# Patient Record
Sex: Female | Born: 1950 | Race: White | Hispanic: No | Smoking: Former smoker
Health system: Southern US, Community
[De-identification: ages and names within clinical notes are randomized; demographics above are authoritative.]

## PROBLEM LIST (undated history)

## (undated) DIAGNOSIS — E785 Hyperlipidemia, unspecified: Secondary | ICD-10-CM

## (undated) DIAGNOSIS — J309 Allergic rhinitis, unspecified: Secondary | ICD-10-CM

## (undated) DIAGNOSIS — Z780 Asymptomatic menopausal state: Secondary | ICD-10-CM

## (undated) DIAGNOSIS — R011 Cardiac murmur, unspecified: Secondary | ICD-10-CM

## (undated) DIAGNOSIS — F419 Anxiety disorder, unspecified: Secondary | ICD-10-CM

## (undated) DIAGNOSIS — G4700 Insomnia, unspecified: Secondary | ICD-10-CM

## (undated) DIAGNOSIS — E8881 Metabolic syndrome: Secondary | ICD-10-CM

## (undated) DIAGNOSIS — M1990 Unspecified osteoarthritis, unspecified site: Secondary | ICD-10-CM

## (undated) DIAGNOSIS — T7840XA Allergy, unspecified, initial encounter: Secondary | ICD-10-CM

## (undated) DIAGNOSIS — E119 Type 2 diabetes mellitus without complications: Secondary | ICD-10-CM

## (undated) DIAGNOSIS — C4491 Basal cell carcinoma of skin, unspecified: Secondary | ICD-10-CM

## (undated) DIAGNOSIS — N200 Calculus of kidney: Secondary | ICD-10-CM

## (undated) HISTORY — DX: Allergic rhinitis, unspecified: J30.9

## (undated) HISTORY — PX: OTHER SURGICAL HISTORY: SHX169

## (undated) HISTORY — DX: Insomnia, unspecified: G47.00

## (undated) HISTORY — DX: Hyperlipidemia, unspecified: E78.5

## (undated) HISTORY — DX: Anxiety disorder, unspecified: F41.9

## (undated) HISTORY — DX: Metabolic syndrome: E88.81

## (undated) HISTORY — DX: Allergy, unspecified, initial encounter: T78.40XA

## (undated) HISTORY — DX: Unspecified osteoarthritis, unspecified site: M19.90

## (undated) HISTORY — DX: Asymptomatic menopausal state: Z78.0

## (undated) HISTORY — DX: Type 2 diabetes mellitus without complications: E11.9

## (undated) HISTORY — DX: Calculus of kidney: N20.0

## (undated) HISTORY — DX: Basal cell carcinoma of skin, unspecified: C44.91

## (undated) HISTORY — DX: Metabolic syndrome: E88.810

## (undated) HISTORY — DX: Cardiac murmur, unspecified: R01.1

## (undated) HISTORY — PX: CHOLECYSTECTOMY: SHX55

---

## 1998-02-22 ENCOUNTER — Other Ambulatory Visit: Admission: RE | Admit: 1998-02-22 | Discharge: 1998-02-22 | Payer: Self-pay | Admitting: Orthopedic Surgery

## 1998-11-06 ENCOUNTER — Other Ambulatory Visit: Admission: RE | Admit: 1998-11-06 | Discharge: 1998-11-06 | Payer: Self-pay | Admitting: Obstetrics & Gynecology

## 2000-01-20 ENCOUNTER — Other Ambulatory Visit: Admission: RE | Admit: 2000-01-20 | Discharge: 2000-01-20 | Payer: Self-pay | Admitting: Obstetrics & Gynecology

## 2001-05-05 ENCOUNTER — Other Ambulatory Visit: Admission: RE | Admit: 2001-05-05 | Discharge: 2001-05-05 | Payer: Self-pay | Admitting: Obstetrics & Gynecology

## 2001-05-27 ENCOUNTER — Encounter: Admission: RE | Admit: 2001-05-27 | Discharge: 2001-08-25 | Payer: Self-pay | Admitting: Family Medicine

## 2002-06-28 ENCOUNTER — Other Ambulatory Visit: Admission: RE | Admit: 2002-06-28 | Discharge: 2002-06-28 | Payer: Self-pay | Admitting: Obstetrics & Gynecology

## 2003-02-09 ENCOUNTER — Observation Stay (HOSPITAL_COMMUNITY): Admission: EM | Admit: 2003-02-09 | Discharge: 2003-02-10 | Payer: Self-pay | Admitting: Emergency Medicine

## 2003-02-09 ENCOUNTER — Encounter (INDEPENDENT_AMBULATORY_CARE_PROVIDER_SITE_OTHER): Payer: Self-pay | Admitting: Specialist

## 2003-02-09 ENCOUNTER — Encounter: Payer: Self-pay | Admitting: Emergency Medicine

## 2003-09-15 ENCOUNTER — Other Ambulatory Visit: Admission: RE | Admit: 2003-09-15 | Discharge: 2003-09-15 | Payer: Self-pay | Admitting: Obstetrics & Gynecology

## 2004-06-10 ENCOUNTER — Encounter: Admission: RE | Admit: 2004-06-10 | Discharge: 2004-07-11 | Payer: Self-pay | Admitting: Family Medicine

## 2004-07-12 ENCOUNTER — Ambulatory Visit: Payer: Self-pay | Admitting: Family Medicine

## 2004-08-21 ENCOUNTER — Ambulatory Visit: Payer: Self-pay | Admitting: Internal Medicine

## 2004-09-11 ENCOUNTER — Encounter: Admission: RE | Admit: 2004-09-11 | Discharge: 2004-12-10 | Payer: Self-pay | Admitting: Family Medicine

## 2004-09-25 ENCOUNTER — Ambulatory Visit: Payer: Self-pay | Admitting: Family Medicine

## 2004-10-14 ENCOUNTER — Other Ambulatory Visit: Admission: RE | Admit: 2004-10-14 | Discharge: 2004-10-14 | Payer: Self-pay | Admitting: Obstetrics & Gynecology

## 2004-10-16 ENCOUNTER — Ambulatory Visit: Payer: Self-pay | Admitting: Internal Medicine

## 2004-10-21 ENCOUNTER — Ambulatory Visit: Payer: Self-pay | Admitting: Internal Medicine

## 2005-02-27 ENCOUNTER — Ambulatory Visit: Payer: Self-pay | Admitting: Internal Medicine

## 2005-03-25 ENCOUNTER — Ambulatory Visit: Payer: Self-pay | Admitting: Internal Medicine

## 2005-05-29 ENCOUNTER — Ambulatory Visit: Payer: Self-pay | Admitting: Internal Medicine

## 2005-09-03 ENCOUNTER — Ambulatory Visit: Payer: Self-pay | Admitting: Internal Medicine

## 2005-12-03 ENCOUNTER — Ambulatory Visit: Payer: Self-pay | Admitting: Family Medicine

## 2006-01-12 ENCOUNTER — Ambulatory Visit: Payer: Self-pay | Admitting: Internal Medicine

## 2006-03-24 ENCOUNTER — Ambulatory Visit: Payer: Self-pay | Admitting: Internal Medicine

## 2006-05-07 ENCOUNTER — Ambulatory Visit: Payer: Self-pay | Admitting: Internal Medicine

## 2006-07-17 ENCOUNTER — Ambulatory Visit: Payer: Self-pay | Admitting: Internal Medicine

## 2006-07-17 LAB — CONVERTED CEMR LAB
ALT: 25 units/L (ref 0–40)
AST: 22 units/L (ref 0–37)
Chol/HDL Ratio, serum: 4.7
Cholesterol: 188 mg/dL (ref 0–200)
Glucose, Bld: 128 mg/dL — ABNORMAL HIGH (ref 70–99)
HDL: 40.1 mg/dL (ref >39.0)
LDL Cholesterol: 116 mg/dL — ABNORMAL HIGH (ref 0–99)
Triglyceride fasting, serum: 157 mg/dL — ABNORMAL HIGH (ref 0–149)
VLDL: 31 mg/dL (ref 0–40)

## 2006-09-23 ENCOUNTER — Ambulatory Visit: Payer: Self-pay | Admitting: Internal Medicine

## 2007-02-01 ENCOUNTER — Telehealth (INDEPENDENT_AMBULATORY_CARE_PROVIDER_SITE_OTHER): Payer: Self-pay | Admitting: *Deleted

## 2007-02-03 ENCOUNTER — Telehealth (INDEPENDENT_AMBULATORY_CARE_PROVIDER_SITE_OTHER): Payer: Self-pay | Admitting: *Deleted

## 2007-03-22 ENCOUNTER — Telehealth: Payer: Self-pay | Admitting: Internal Medicine

## 2007-04-15 ENCOUNTER — Telehealth: Payer: Self-pay | Admitting: Internal Medicine

## 2007-04-22 ENCOUNTER — Ambulatory Visit: Payer: Self-pay | Admitting: Internal Medicine

## 2007-04-22 DIAGNOSIS — J309 Allergic rhinitis, unspecified: Secondary | ICD-10-CM

## 2007-04-22 DIAGNOSIS — Z87442 Personal history of urinary calculi: Secondary | ICD-10-CM

## 2007-04-22 DIAGNOSIS — N951 Menopausal and female climacteric states: Secondary | ICD-10-CM | POA: Insufficient documentation

## 2007-04-22 DIAGNOSIS — G4700 Insomnia, unspecified: Secondary | ICD-10-CM | POA: Insufficient documentation

## 2007-04-22 DIAGNOSIS — E785 Hyperlipidemia, unspecified: Secondary | ICD-10-CM

## 2007-04-22 DIAGNOSIS — D1800 Hemangioma unspecified site: Secondary | ICD-10-CM | POA: Insufficient documentation

## 2007-04-22 DIAGNOSIS — C792 Secondary malignant neoplasm of skin: Secondary | ICD-10-CM

## 2007-04-22 HISTORY — DX: Hyperlipidemia, unspecified: E78.5

## 2007-04-22 HISTORY — DX: Allergic rhinitis, unspecified: J30.9

## 2007-07-13 ENCOUNTER — Telehealth: Payer: Self-pay | Admitting: Internal Medicine

## 2007-10-13 ENCOUNTER — Telehealth (INDEPENDENT_AMBULATORY_CARE_PROVIDER_SITE_OTHER): Payer: Self-pay | Admitting: *Deleted

## 2007-11-12 ENCOUNTER — Ambulatory Visit: Payer: Self-pay | Admitting: Internal Medicine

## 2008-04-03 ENCOUNTER — Ambulatory Visit: Payer: Self-pay | Admitting: Internal Medicine

## 2008-04-03 LAB — CONVERTED CEMR LAB
ALT: 24 units/L (ref 0–35)
AST: 22 units/L (ref 0–37)
BUN: 13 mg/dL (ref 6–23)
CO2: 27 meq/L (ref 19–32)
Calcium: 9.6 mg/dL (ref 8.4–10.5)
Chloride: 107 meq/L (ref 96–112)
Cholesterol: 218 mg/dL (ref 0–200)
Creatinine, Ser: 0.8 mg/dL (ref 0.4–1.2)
Direct LDL: 156.9 mg/dL
GFR calc Af Amer: 94 mL/min
GFR calc non Af Amer: 78 mL/min
Glucose, Bld: 209 mg/dL — ABNORMAL HIGH (ref 70–99)
HDL: 35.2 mg/dL — ABNORMAL LOW (ref >39.0)
Potassium: 4.8 meq/L (ref 3.5–5.1)
Sodium: 141 meq/L (ref 135–145)
TSH: 2.2 microintl units/mL (ref 0.35–5.50)
Total CHOL/HDL Ratio: 6.2
Triglycerides: 181 mg/dL — ABNORMAL HIGH (ref 0–149)
VLDL: 36 mg/dL (ref 0–40)

## 2008-04-10 ENCOUNTER — Telehealth (INDEPENDENT_AMBULATORY_CARE_PROVIDER_SITE_OTHER): Payer: Self-pay | Admitting: *Deleted

## 2008-04-11 ENCOUNTER — Ambulatory Visit: Payer: Self-pay | Admitting: Internal Medicine

## 2008-04-12 ENCOUNTER — Telehealth: Payer: Self-pay | Admitting: Internal Medicine

## 2008-08-09 ENCOUNTER — Telehealth (INDEPENDENT_AMBULATORY_CARE_PROVIDER_SITE_OTHER): Payer: Self-pay | Admitting: *Deleted

## 2008-08-14 ENCOUNTER — Ambulatory Visit: Payer: Self-pay | Admitting: Internal Medicine

## 2008-08-14 DIAGNOSIS — E119 Type 2 diabetes mellitus without complications: Secondary | ICD-10-CM

## 2008-08-14 DIAGNOSIS — E118 Type 2 diabetes mellitus with unspecified complications: Secondary | ICD-10-CM

## 2008-08-14 HISTORY — DX: Type 2 diabetes mellitus without complications: E11.9

## 2008-08-31 ENCOUNTER — Ambulatory Visit: Payer: Self-pay | Admitting: Family Medicine

## 2008-09-06 ENCOUNTER — Telehealth (INDEPENDENT_AMBULATORY_CARE_PROVIDER_SITE_OTHER): Payer: Self-pay | Admitting: *Deleted

## 2008-10-25 ENCOUNTER — Telehealth (INDEPENDENT_AMBULATORY_CARE_PROVIDER_SITE_OTHER): Payer: Self-pay | Admitting: *Deleted

## 2008-12-25 ENCOUNTER — Telehealth (INDEPENDENT_AMBULATORY_CARE_PROVIDER_SITE_OTHER): Payer: Self-pay | Admitting: *Deleted

## 2008-12-26 ENCOUNTER — Telehealth: Payer: Self-pay | Admitting: Internal Medicine

## 2008-12-28 ENCOUNTER — Ambulatory Visit: Payer: Self-pay | Admitting: Internal Medicine

## 2008-12-28 LAB — CONVERTED CEMR LAB
AST: 20 units/L (ref 0–37)
Chloride: 107 meq/L (ref 96–112)
Creatinine, Ser: 0.9 mg/dL (ref 0.4–1.2)
GFR calc non Af Amer: 67.87 mL/min (ref >60)
HDL: 39.2 mg/dL (ref >39.00)
Hgb A1c MFr Bld: 7 % — ABNORMAL HIGH (ref 4.6–6.5)
Potassium: 4.4 meq/L (ref 3.5–5.1)
Total CHOL/HDL Ratio: 6
VLDL: 31.8 mg/dL (ref 0.0–40.0)

## 2009-01-01 ENCOUNTER — Ambulatory Visit: Payer: Self-pay | Admitting: Internal Medicine

## 2009-01-01 DIAGNOSIS — F411 Generalized anxiety disorder: Secondary | ICD-10-CM

## 2009-01-05 ENCOUNTER — Telehealth: Payer: Self-pay | Admitting: Internal Medicine

## 2009-01-05 LAB — CONVERTED CEMR LAB: Vit D, 25-Hydroxy: 21 ng/mL — ABNORMAL LOW (ref 30–89)

## 2009-03-12 ENCOUNTER — Encounter: Payer: Self-pay | Admitting: Internal Medicine

## 2009-05-02 LAB — CONVERTED CEMR LAB: Pap Smear: NORMAL

## 2009-05-02 LAB — HM MAMMOGRAPHY: HM Mammogram: NORMAL

## 2009-07-10 ENCOUNTER — Telehealth (INDEPENDENT_AMBULATORY_CARE_PROVIDER_SITE_OTHER): Payer: Self-pay | Admitting: *Deleted

## 2009-07-10 ENCOUNTER — Encounter: Payer: Self-pay | Admitting: Family Medicine

## 2009-07-10 ENCOUNTER — Ambulatory Visit: Payer: Self-pay | Admitting: Family

## 2009-07-10 LAB — CONVERTED CEMR LAB
Bilirubin Urine: NEGATIVE
Protein, U semiquant: NEGATIVE
Specific Gravity, Urine: 1.015

## 2009-07-17 ENCOUNTER — Telehealth (INDEPENDENT_AMBULATORY_CARE_PROVIDER_SITE_OTHER): Payer: Self-pay | Admitting: *Deleted

## 2009-08-08 ENCOUNTER — Ambulatory Visit: Payer: Self-pay | Admitting: Internal Medicine

## 2009-08-08 DIAGNOSIS — E559 Vitamin D deficiency, unspecified: Secondary | ICD-10-CM | POA: Insufficient documentation

## 2009-08-08 LAB — CONVERTED CEMR LAB: Vit D, 25-Hydroxy: 26 ng/mL — ABNORMAL LOW (ref 30–89)

## 2009-08-13 LAB — CONVERTED CEMR LAB
Basophils Relative: 1.1 % (ref 0.0–3.0)
Eosinophils Relative: 4 % (ref 0.0–5.0)
Hgb A1c MFr Bld: 7.1 % — ABNORMAL HIGH (ref 4.6–6.5)
Lymphocytes Relative: 34.9 % (ref 12.0–46.0)
MCV: 98.3 fL (ref 78.0–100.0)
Microalb Creat Ratio: 5.4 mg/g (ref 0.0–30.0)
Microalb, Ur: 0.9 mg/dL (ref 0.0–1.9)
Monocytes Absolute: 0.4 10*3/uL (ref 0.1–1.0)
Monocytes Relative: 5.9 % (ref 3.0–12.0)
Neutrophils Relative %: 54.1 % (ref 43.0–77.0)
Platelets: 258 10*3/uL (ref 150.0–400.0)
RBC: 4.82 M/uL (ref 3.87–5.11)
WBC: 6.1 10*3/uL (ref 4.5–10.5)

## 2009-09-24 ENCOUNTER — Encounter (INDEPENDENT_AMBULATORY_CARE_PROVIDER_SITE_OTHER): Payer: Self-pay | Admitting: *Deleted

## 2009-09-24 ENCOUNTER — Telehealth: Payer: Self-pay | Admitting: Internal Medicine

## 2009-12-06 ENCOUNTER — Encounter (INDEPENDENT_AMBULATORY_CARE_PROVIDER_SITE_OTHER): Payer: Self-pay | Admitting: *Deleted

## 2009-12-06 ENCOUNTER — Ambulatory Visit: Payer: Self-pay | Admitting: Internal Medicine

## 2009-12-11 LAB — CONVERTED CEMR LAB
BUN: 13 mg/dL (ref 6–23)
Chloride: 105 meq/L (ref 96–112)
Cholesterol: 210 mg/dL — ABNORMAL HIGH (ref 0–200)
GFR calc non Af Amer: 77.5 mL/min (ref >60)
HDL: 48.3 mg/dL (ref >39.00)
Hgb A1c MFr Bld: 7.3 % — ABNORMAL HIGH (ref 4.6–6.5)
Potassium: 4.4 meq/L (ref 3.5–5.1)
Sodium: 141 meq/L (ref 135–145)
Total CHOL/HDL Ratio: 4
Triglycerides: 180 mg/dL — ABNORMAL HIGH (ref 0.0–149.0)
VLDL: 36 mg/dL (ref 0.0–40.0)

## 2009-12-31 ENCOUNTER — Ambulatory Visit: Payer: Self-pay | Admitting: Internal Medicine

## 2010-01-01 ENCOUNTER — Encounter (INDEPENDENT_AMBULATORY_CARE_PROVIDER_SITE_OTHER): Payer: Self-pay | Admitting: *Deleted

## 2010-02-19 ENCOUNTER — Telehealth: Payer: Self-pay | Admitting: Internal Medicine

## 2010-04-11 ENCOUNTER — Ambulatory Visit: Payer: Self-pay | Admitting: Internal Medicine

## 2010-04-12 LAB — CONVERTED CEMR LAB: Hgb A1c MFr Bld: 7.7 % — ABNORMAL HIGH (ref 4.6–6.5)

## 2010-04-16 ENCOUNTER — Telehealth: Payer: Self-pay | Admitting: Internal Medicine

## 2010-04-30 ENCOUNTER — Telehealth (INDEPENDENT_AMBULATORY_CARE_PROVIDER_SITE_OTHER): Payer: Self-pay | Admitting: *Deleted

## 2010-05-02 ENCOUNTER — Telehealth: Payer: Self-pay | Admitting: Internal Medicine

## 2010-05-14 ENCOUNTER — Telehealth: Payer: Self-pay | Admitting: Internal Medicine

## 2010-06-05 ENCOUNTER — Telehealth: Payer: Self-pay | Admitting: Internal Medicine

## 2010-08-13 ENCOUNTER — Telehealth: Payer: Self-pay | Admitting: Internal Medicine

## 2010-08-20 ENCOUNTER — Ambulatory Visit: Payer: Self-pay | Admitting: Internal Medicine

## 2010-09-06 ENCOUNTER — Other Ambulatory Visit: Payer: Self-pay | Admitting: Internal Medicine

## 2010-09-06 ENCOUNTER — Ambulatory Visit
Admission: RE | Admit: 2010-09-06 | Discharge: 2010-09-06 | Payer: Self-pay | Source: Home / Self Care | Attending: Internal Medicine | Admitting: Internal Medicine

## 2010-09-06 LAB — BASIC METABOLIC PANEL
BUN: 16 mg/dL (ref 6–23)
CO2: 27 mEq/L (ref 19–32)
Calcium: 9.6 mg/dL (ref 8.4–10.5)
Chloride: 104 mEq/L (ref 96–112)
Creatinine, Ser: 0.8 mg/dL (ref 0.4–1.2)
GFR: 82.03 mL/min (ref >60.00)
Glucose, Bld: 209 mg/dL — ABNORMAL HIGH (ref 70–99)
Potassium: 4.7 mEq/L (ref 3.5–5.1)
Sodium: 141 mEq/L (ref 135–145)

## 2010-09-06 LAB — ALT: ALT: 24 U/L (ref 0–35)

## 2010-09-06 LAB — MICROALBUMIN / CREATININE URINE RATIO
Creatinine,U: 186 mg/dL
Microalb Creat Ratio: 1.1 mg/g (ref 0.0–30.0)
Microalb, Ur: 2.1 mg/dL — ABNORMAL HIGH (ref 0.0–1.9)

## 2010-09-06 LAB — LIPID PANEL
Cholesterol: 276 mg/dL — ABNORMAL HIGH (ref 0–200)
HDL: 46.1 mg/dL (ref >39.00)
Total CHOL/HDL Ratio: 6
Triglycerides: 179 mg/dL — ABNORMAL HIGH (ref 0.0–149.0)
VLDL: 35.8 mg/dL (ref 0.0–40.0)

## 2010-09-06 LAB — AST: AST: 17 U/L (ref 0–37)

## 2010-09-06 LAB — HEMOGLOBIN A1C: Hgb A1c MFr Bld: 8.6 % — ABNORMAL HIGH (ref 4.6–6.5)

## 2010-09-06 LAB — LDL CHOLESTEROL, DIRECT: Direct LDL: 222 mg/dL

## 2010-09-16 ENCOUNTER — Telehealth: Payer: Self-pay | Admitting: Internal Medicine

## 2010-09-29 LAB — CONVERTED CEMR LAB
ALT: 25 units/L (ref 0–35)
AST: 20 units/L (ref 0–37)
BUN: 12 mg/dL (ref 6–23)
Basophils Absolute: 0.1 10*3/uL (ref 0.0–0.1)
Basophils Relative: 0.9 % (ref 0.0–1.0)
CO2: 27 meq/L (ref 19–32)
Calcium: 9.5 mg/dL (ref 8.4–10.5)
Chloride: 106 meq/L (ref 96–112)
Cholesterol: 217 mg/dL (ref 0–200)
Creatinine, Ser: 0.8 mg/dL (ref 0.4–1.2)
Direct LDL: 157.9 mg/dL
Eosinophils Absolute: 0.2 10*3/uL (ref 0.0–0.6)
Eosinophils Relative: 2.4 % (ref 0.0–5.0)
GFR calc Af Amer: 95 mL/min
GFR calc non Af Amer: 78 mL/min
Glucose, Bld: 141 mg/dL — ABNORMAL HIGH (ref 70–99)
HCT: 43.2 % (ref 36.0–46.0)
HDL: 36 mg/dL — ABNORMAL LOW (ref >39.0)
Hemoglobin: 15.1 g/dL — ABNORMAL HIGH (ref 12.0–15.0)
Lymphocytes Relative: 33.1 % (ref 12.0–46.0)
MCHC: 35 g/dL (ref 30.0–36.0)
MCV: 93.6 fL (ref 78.0–100.0)
Monocytes Absolute: 0.4 10*3/uL (ref 0.2–0.7)
Monocytes Relative: 6.4 % (ref 3.0–11.0)
Neutro Abs: 3.8 10*3/uL (ref 1.4–7.7)
Neutrophils Relative %: 57.2 % (ref 43.0–77.0)
Platelets: 324 10*3/uL (ref 150–400)
Potassium: 4.3 meq/L (ref 3.5–5.1)
RBC: 4.62 M/uL (ref 3.87–5.11)
RDW: 12.5 % (ref 11.5–14.6)
Sodium: 141 meq/L (ref 135–145)
TSH: 2.23 microintl units/mL (ref 0.35–5.50)
Total CHOL/HDL Ratio: 6
Triglycerides: 216 mg/dL (ref 0–149)
VLDL: 43 mg/dL — ABNORMAL HIGH (ref 0–40)
WBC: 6.8 10*3/uL (ref 4.5–10.5)

## 2010-10-03 NOTE — Progress Notes (Signed)
Summary: METFORMIN CONCERN  Phone Note Call from Patient Call back at [phone]   Caller: Patient Summary of Call: PT IS CONCERNED ABOUT HER RX FOR METFORMIN.  PT BELIEVES THE DOSAGE IS TOO HIGH AND WOULD LIKE FOR NURSE OR DR. PAZ TO CALL HER BACK TO DISCUSS WHY SHE HAS SUCH A HIGH DOSAGE. PT STATES HER AUNT AND SEVERAL FRIENDS  WHO HAVE TAKEN METFORMIN FOR 15 YRS ONLY TAKES 1000 MG ONCE A DAY. PT STATES SHE WAS INSTRUCTED TO TAKE 1/2 TABLET FOR ONE WEEK TWICE A DAY AND THEN TO TAKE 1 TABLET TWICE A DAY AND SHE BELIEVES THIS IS TOO MUCH. PT HAS NOT STARTED TAKING THE MEDICINE YET. PLEASE ADVISE Initial call taken by: Lavell Islam,  April 16, 2010 8:48 AM  Follow-up for Phone Call        I think 1000 mg twice a day is probably what will take to get the A1c down to close to 6. We can simply stay on metformin 1000 mg half tablet twice a day and recheck in 3 months when she comes back. Jose E. Paz MD  April 16, 2010 4:10 PM   Additional Follow-up for Phone Call Additional follow up Details #1::        I spoke with pt she is aware- she states she is only going to take 1/2 tab two times a day.    New/Updated Medications: GLUCOPHAGE 1000 MG TABS (METFORMIN HCL) half tablet twice a day

## 2010-10-03 NOTE — Progress Notes (Signed)
Summary: sugar levels  Phone Note Call from Patient Call back at Work Phone [phone]   Summary of Call: Patient left message on triage that she would like to know MD recommendations for what her sugar should be when she checks it. I see that patient is new to DM, please advise. Initial call taken by: Lucious Groves CMA,  June 05, 2010 3:42 PM  Follow-up for Phone Call        Per MD patient was notified that fasting level should be 80-120 and post meals level should be <170. Patient checks her levels three times a day and will keep a log and call back with those numbers. Patient notes that she has seen a nutritionist and has not been taking the metformin (trying to avoid it), but is aware that if AM readings continue to run high, she will have to begin the Metformin. Follow-up by: Lucious Groves CMA,  June 05, 2010 4:29 PM

## 2010-10-03 NOTE — Progress Notes (Signed)
Summary: refill  Phone Note Refill Request Message from:  Fax from Pharmacy on February 19, 2010 10:58 AM  Refills Requested: Medication #1:  ALPRAZOLAM 0.5 MG  TABS one p.o. t.i.d. p.r.n. anxiety [facility] fax [phone] - phone [phone]  Initial call taken by: Okey Regal Spring,  February 19, 2010 10:59 AM  Follow-up for Phone Call        12-06-09, LAST OV,LAST  FILLED #100 NO REFILLS........Marland KitchenFelecia Deloach CMA  February 19, 2010 3:05 PM  ok 100 and 1 RF Renesmee Raine E. Ilamae Geng MD  February 20, 2010 10:37 AM     Prescriptions: ALPRAZOLAM 0.5 MG  TABS (ALPRAZOLAM) one p.o. t.i.d. p.r.n. anxiety, two at bedtime  #100 x 1   Entered by:   Jeremy Johann CMA   Authorized by:   Nolon Rod. Mustapha Colson MD   Signed by:   Jeremy Johann CMA on 02/20/2010   Method used:   Printed then faxed to ...       [facility]  [address] [phone]* (retail)       [address]       [place], [place]  [address]       Ph: [phone]       Fax: [phone]   RxID:   [id number]

## 2010-10-03 NOTE — Progress Notes (Signed)
Summary: Nutritionist request  Phone Note Call from Patient   Summary of Call: Patient called noting that she needs to go to a Dietician due to her DM. She is aware to await call from [facility]. Initial call taken by: Lucious Groves CMA,  September 16, 2010 10:11 AM

## 2010-10-03 NOTE — Assessment & Plan Note (Signed)
Summary: CPX,FASTING/RH......Marland Kitchen   Vital Signs:  Patient profile:   60 year old female Height:      66 inches Weight:      199.8 pounds BMI:     32.37 Pulse rate:   70 / minute BP sitting:   140 / 80  Vitals Entered By: Shary Decamp (December 06, 2009 8:06 AM) CC: cpx - fasting, has gyn Comments  - never had colonoscopy  - change nasonex due to nose bleeds  - ? increase xanax -- mother in nursing home, +stress, +not sleeping Shary Decamp  December 06, 2009 8:07 AM    History of Present Illness: cpx - fasting   anxiety-- ? increase xanax -- mother in nursing home, +stress, +not sleeping AODM -- ambulatory CBGs in the 120s  Allergic rhinitis--change nasonex d/t nosebleed during the winter     Current Medications (verified): 1)  Allegra 180 Mg Tabs (Fexofenadine Hcl) .Marland Kitchen.. 1 By Mouth Qd 2)  Aspir-Low 81 Mg  Tbec (Aspirin) 3)  Nasonex 50 Mcg/act Susp (Mometasone Furoate) .... 2 Sprays Once Daily 4)  Alprazolam 0.5 Mg  Tabs (Alprazolam) .Marland Kitchen.. 1-3 By Mouth At Bedtime - Due Complete Physical in April 5)  One Touch Ultra Test Strips & Lancets .... Checks Bs 1x/day Dx 250.00 6)  Pravachol 40 Mg Tabs (Pravastatin Sodium) .Marland Kitchen.. 1 By Mouth At Bedtime 7)  Advil 200 Mg Caps (Ibuprofen) .... Two Caps Every 6 Hours As Needed 8)  Vit D (Otc)  Allergies (verified): 1)  ! * Vytorin 2)  ! * Crestor 3)  ! Augmentin 4)  ! Codeine 5)  ! Sulfa 6)  ! Macrobid 7)  ! Prednisone 8)  ! Relafen 9)  ! * E-Mycin 10)  ! Avelox  Past History:  Past Medical History: AODM Dx 04-2008 Allergic rhinitis Kidney Stone Metabolic Syndrome Menopause Insomnia Hyperlipidemia Anxiety  Past Surgical History: Reviewed history from 04/22/2007 and no changes required. Cholecystectomy  Family History: Family History CAD Family History DM M - dementia, PAD, psychosis F - mult myeloma, stroke colon ca - no DM - M, aunt, GM HTN - M, aunt breast Ca - no CAD - F  Social History: Never Smoked Alcohol  use-yes-socially Widow (1990) works 2 jobs  lives alone 1 daughter diet--  "ok"  lately  exercise--treadmill sometimes , active in the yard  Review of Systems CV:  Denies chest pain or discomfort and swelling of feet. Resp:  Denies cough and shortness of breath. GI:  Denies bloody stools, diarrhea, nausea, and vomiting.  Physical Exam  General:  alert and well-developed.   Neck:  no masses, no thyromegaly, and normal carotid upstroke.   Lungs:  normal respiratory effort, no intercostal retractions, no accessory muscle use, and normal breath sounds.   Heart:  normal rate, regular rhythm, no murmur, and no gallop.   Abdomen:  soft, non-tender, and no distention.   Pulses:  normal pedal pulses bilaterally  Extremities:  no pretibial edema bilaterally   Diabetes Management Exam:    Foot Exam (with socks and/or shoes not present):       Sensory-Pinprick/Light touch:          Left medial foot (L-4): normal          Left dorsal foot (L-5): normal          Left lateral foot (S-1): normal          Right medial foot (L-4): normal  Right dorsal foot (L-5): normal          Right lateral foot (S-1): normal       Sensory-Monofilament:          Left foot: normal          Right foot: normal       Inspection:          Left foot: normal          Right foot: normal       Nails:          Left foot: normal          Right foot: normal   Impression & Recommendations:  Problem # 1:  HEALTH MAINTENANCE EXAM (ICD-V70.0) Td 05  never had a Cscope  "I don't like to talk about it today!" ----> provided iFOB  Gyn Dr Edward Jolly Mammogram:   Date:  05/02/2009   Results:  normal  Pap Smear:   Date:  05/02/2009   Results:  normal  DEXA-- per Dr Edward Jolly   information provided regard to diet and exercise   Orders: Venipuncture ([address]) TLB-BMP (Basic Metabolic Panel-BMET) (80048-METABOL) TLB-TSH (Thyroid Stimulating Hormone) (84443-TSH)  Problem # 2:  ANXIETY (ICD-300.00) increase Xanax to  t.i.d. and two at bedtime Her updated medication list for this problem includes:    Alprazolam 0.5 Mg Tabs (Alprazolam) ..... One p.o. t.i.d. p.r.n. anxiety, two at bedtime  Problem # 3:  AODM (ICD-250.00) due for labs Her updated medication list for this problem includes:    Aspir-low 81 Mg Tbec (Aspirin)    Labs Reviewed: Creat: 0.9 (12/28/2008)     Last Eye Exam: normal (03/12/2009) Reviewed HgBA1c results: 7.1 (08/08/2009)  7.0 (12/28/2008)  Orders: TLB-A1C / Hgb A1C (Glycohemoglobin) (83036-A1C)  Problem # 4:  HYPERLIPIDEMIA (ICD-272.4) due for labs Her updated medication list for this problem includes:    Pravachol 40 Mg Tabs (Pravastatin sodium) .Marland Kitchen... 1 by mouth at bedtime    Labs Reviewed: SGOT: 21 (08/08/2009)   SGPT: 20 (08/08/2009)   HDL:39.20 (12/28/2008), 35.2 (04/03/2008)  LDL:DEL (04/03/2008), DEL (04/22/2007)  Chol:254 (12/28/2008), 218 (04/03/2008)  Trig:159.0 (12/28/2008), 181 (04/03/2008)  Orders: TLB-Lipid Panel (80061-LIPID)  Complete Medication List: 1)  Allegra 180 Mg Tabs (Fexofenadine hcl) .Marland Kitchen.. 1 by mouth qd 2)  Aspir-low 81 Mg Tbec (Aspirin) 3)  Veramyst 27.5 Mcg/spray Susp (Fluticasone furoate) .... 2 sprays on each side of the nose once daily 4)  Alprazolam 0.5 Mg Tabs (Alprazolam) .... One p.o. t.i.d. p.r.n. anxiety, two at bedtime 5)  One Touch Ultra Test Strips & Lancets  .... Checks bs 1x/day dx 250.00 6)  Pravachol 40 Mg Tabs (Pravastatin sodium) .Marland Kitchen.. 1 by mouth at bedtime 7)  Advil 200 Mg Caps (Ibuprofen) .... Two caps every 6 hours as needed 8)  Vit D (otc)   Patient Instructions: 1)  Please schedule a follow-up appointment in 4 months .  Prescriptions: VERAMYST 27.5 MCG/SPRAY SUSP (FLUTICASONE FUROATE) 2 sprays on each side of the nose once daily  #1 x 12   Entered and Authorized by:   Myna Freimark E. Lilianah Buffin MD   Signed by:   Nolon Rod. Timarie Labell MD on 12/06/2009   Method used:   Print then Give to Patient   RxID:   [id number] ALPRAZOLAM 0.5  MG  TABS (ALPRAZOLAM) one p.o. t.i.d. p.r.n. anxiety, two at bedtime  #100 x 0   Entered and Authorized by:   Nolon Rod. Gina Leblond MD   Signed by:   Nolon Rod.  Dequarius Jeffries MD on 12/06/2009   Method used:   Print then Give to Patient   RxID:   [id number] ALLEGRA 180 MG TABS (FEXOFENADINE HCL) 1 by mouth qd  #90 x 3   Entered by:   Shary Decamp   Authorized by:   Nolon Rod. Jahan Friedlander MD   Signed by:   Shary Decamp on 12/06/2009   Method used:   Electronically to        [facility] [phone]* (retail)       [address]       [place], [place]  [address]       Ph: [phone]       Fax: [phone]   RxID:   [id number]    Preventive Care Screening  Mammogram:    Date:  05/02/2009    Results:  normal   Pap Smear:    Date:  05/02/2009    Results:  normal   Bone Density:    Date:  09/01/2005    Results:  per gyn std dev  Last Tetanus Booster:    Date:  09/02/2003    Results:  Historical

## 2010-10-03 NOTE — Progress Notes (Signed)
Summary: refill  Phone Note Refill Request Message from:  Fax from Pharmacy on [address] fax [phone]  Refills Requested: Medication #1:  ALPRAZOLAM 0.5 MG  TABS 1-3 by mouth qhs Initial call taken by: Barb Merino,  September 24, 2009 12:42 PM  Follow-up for Phone Call         - #90 with 1 refill on 11/10  - had rov 12/10  - due cpx 4/11 ok'd #90 with 2 refills reminder letter mailed to schedule cpx for April Stacia Hawks  September 24, 2009 1:10 PM     New/Updated Medications: ALPRAZOLAM 0.5 MG  TABS (ALPRAZOLAM) 1-3 by mouth at bedtime - DUE COMPLETE PHYSICAL IN APRIL Prescriptions: ALPRAZOLAM 0.5 MG  TABS (ALPRAZOLAM) 1-3 by mouth at bedtime - DUE COMPLETE PHYSICAL IN APRIL  #90 x 2   Entered by:   Shary Decamp   Authorized by:   Nolon Rod. Raye Slyter MD   Signed by:   Shary Decamp on 09/24/2009   Method used:   Printed then faxed to ...       [facility]  [address] [phone]* (retail)       [address]       [place], [place]  [address]       Ph: [phone]       Fax: [phone]   RxID:   [id number]

## 2010-10-03 NOTE — Assessment & Plan Note (Signed)
Summary: 4 month roa/lch   Vital Signs:  Patient profile:   60 year old female Weight:      197.50 pounds Pulse rate:   75 / minute Pulse rhythm:   regular BP sitting:   124 / 74  (left arm) Cuff size:   large  Vitals Entered By: Army Fossa CMA (April 11, 2010 8:03 AM) CC: follow up visit: fasting Comments discuss OTC allergy meds.   CC:  follow up visit: fasting.  History of Present Illness: her main concern today is allergies Continue with symptoms despite daily Flonase and Allegra over-the-counter 180 mg; states allegra made her slightly dizzy Still sneezing a lot, runny nose     Current Medications (verified): 1)  Allegra 180 Mg Tabs (Fexofenadine Hcl) .Marland Kitchen.. 1 By Mouth Qd 2)  Aspir-Low 81 Mg  Tbec (Aspirin) 3)  Veramyst 27.5 Mcg/spray Susp (Fluticasone Furoate) .... 2 Sprays On Each Side of The Nose Once Daily 4)  Alprazolam 0.5 Mg  Tabs (Alprazolam) .... One P.o. T.i.d. P.r.n. Anxiety, Two At Bedtime 5)  One Touch Ultra Test Strips & Lancets .... Checks Bs 1x/day Dx 250.00 6)  Pravachol 40 Mg Tabs (Pravastatin Sodium) .Marland Kitchen.. 1 By Mouth At Bedtime 7)  Advil 200 Mg Caps (Ibuprofen) .... Two Caps Every 6 Hours As Needed 8)  Vit D (Otc) 9)  Glucophage 1000 Mg Tabs (Metformin Hcl) .... Holding, Patient Does Not Want To Take Right Now  Allergies: 1)  ! * Vytorin 2)  ! * Crestor 3)  ! Augmentin 4)  ! Codeine 5)  ! Sulfa 6)  ! Macrobid 7)  ! Prednisone 8)  ! Relafen 9)  ! * E-Mycin 10)  ! Avelox  Past History:  Past Medical History: AODM Dx 04-2008 Allergic rhinitis Kidney Stone Metabolic Syndrome Menopause Insomnia Hyperlipidemia Anxiety  Past Surgical History: Reviewed history from 04/22/2007 and no changes required. Cholecystectomy  Social History: Reviewed history from 12/06/2009 and no changes required. Never Smoked Alcohol use-yes-socially Widow (1990) works 2 jobs  lives alone 1 daughter diet--  "ok"  lately  exercise--treadmill  sometimes , active in the yard  Review of Systems        AODM -- ambulatory CBGs sporacially checked, usually in the low 100s has not started metformin, trying to work on diet and exercise   Hyperlipidemia-- good medication compliance  Anxiety-- lots of stress in her life, has increased Xanax which is helping ENT:  denies fevers Green nasal discharge from time to time.  Physical Exam  General:  alert and well-developed.   Head:  face symmetric, tender at both the maxillary and frontal sinuses Ears:  R ear normal and L ear normal.   Nose:  slightly congested Mouth:  no redness or discharge Lungs:  normal respiratory effort, no intercostal retractions, no accessory muscle use, and normal breath sounds.   Heart:  normal rate, regular rhythm, no murmur, and no gallop.     Impression & Recommendations:  Problem # 1:  ALLERGIC RHINITIS (ICD-477.9) persistent symptoms despite good medication compliance Intercurrent sinusitis? Plan: continue with generic Allegra at a lower dose over switch to the branded one to see if that made her less dizzy Continue with nasal steroids add astepro Antibiotics---will prescribe doxycycline (see medication allergy list) Her updated medication list for this problem includes:    Allegra 180 Mg Tabs (Fexofenadine hcl) .Marland Kitchen... 1 by mouth qd    Veramyst 27.5 Mcg/spray Susp (Fluticasone furoate) .Marland Kitchen... 2 sprays on each side of the nose  once daily    Astepro 0.15 % Soln (Azelastine hcl) .Marland Kitchen... 2 sprays on each side of the nose twice a day  Problem # 2:  AODM (ICD-250.00)  did not start Glucophage, trying to work on her diet Labs The following medications were removed from the medication list:    Glucophage 1000 Mg Tabs (Metformin hcl) ..... Holding, patient does not want to take right now Her updated medication list for this problem includes:    Aspir-low 81 Mg Tbec (Aspirin)  Labs Reviewed: Creat: 0.8 (12/06/2009)     Last Eye Exam: normal  (03/12/2009) Reviewed HgBA1c results: 7.3 (12/06/2009)  7.1 (08/08/2009)  Orders: Venipuncture ([address]) TLB-A1C / Hgb A1C (Glycohemoglobin) (83036-A1C)  Complete Medication List: 1)  Allegra 180 Mg Tabs (Fexofenadine hcl) .Marland Kitchen.. 1 by mouth qd 2)  Aspir-low 81 Mg Tbec (Aspirin) 3)  Veramyst 27.5 Mcg/spray Susp (Fluticasone furoate) .... 2 sprays on each side of the nose once daily 4)  Alprazolam 0.5 Mg Tabs (Alprazolam) .... One p.o. t.i.d. p.r.n. anxiety, two at bedtime 5)  One Touch Ultra Test Strips & Lancets  .... Checks bs 1x/day dx 250.00 6)  Pravachol 40 Mg Tabs (Pravastatin sodium) .Marland Kitchen.. 1 by mouth at bedtime 7)  Advil 200 Mg Caps (Ibuprofen) .... Two caps every 6 hours as needed 8)  Vit D (otc)  9)  Doxycycline Hyclate 100 Mg Caps (Doxycycline hyclate) .... One by mouth twice a day for 10 days 10)  Astepro 0.15 % Soln (Azelastine hcl) .... 2 sprays on each side of the nose twice a day  Patient Instructions: 1)  rest, fluids, 2)  Continue with veramyst  3)  add astepro nasal spray 4)  Try either brand Allegra or 5 mg Zyrtec or half of the generic Allegra 5)  Antibiotics for 10 days, doxycycline 6)  Call if not better in a couple of weeks 7)  Please schedule a follow-up appointment in 4 months .  Prescriptions: ASTEPRO 0.15 % SOLN (AZELASTINE HCL) 2 sprays on each side of the nose twice a day  #1 x 6   Entered and Authorized by:   Elita Quick E. Paz MD   Signed by:   Nolon Rod. Paz MD on 04/11/2010   Method used:   Electronically to        [facility] [id number]* (retail)       [address]       [place], [place]  [address]       Ph: [phone]       Fax: [phone]   RxID:   [id number] DOXYCYCLINE HYCLATE 100 MG CAPS (DOXYCYCLINE HYCLATE) one by mouth twice a day for 10 days  #20 x 0   Entered and Authorized by:   Nolon Rod. Paz MD   Signed by:   Nolon Rod. Paz MD on 04/11/2010   Method used:   Electronically to        [facility] [phone]*  (retail)       [address]       [place], [place]  [address]       Ph: [phone]       Fax: [phone]   RxID:   [id number]

## 2010-10-03 NOTE — Progress Notes (Signed)
Summary: Alprazolam refill  Phone Note Refill Request Message from:  Fax from Pharmacy on August 13, 2010 4:00 PM  Refills Requested: Medication #1:  ALPRAZOLAM 0.5 MG  TABS one p.o. t.i.d. p.r.n. anxiety   Last Refilled: 07/03/2010 [id number], [place]   [phone]   fax [phone]   qty - 100  Next Appointment Scheduled: Rica Mote 12/20 Paz Initial call taken by: Jerolyn Shin,  August 13, 2010 4:01 PM  Follow-up for Phone Call        Please advise. Lucious Groves CMA  August 13, 2010 4:20 PM   Additional Follow-up for Phone Call Additional follow up Details #1::        ok #100, 1 RF Jose E. Paz MD  August 14, 2010 8:58 AM     Prescriptions: ALPRAZOLAM 0.5 MG  TABS (ALPRAZOLAM) one p.o. t.i.d. p.r.n. anxiety, two at bedtime  #100 x 1   Entered by:   Lucious Groves CMA   Authorized by:   Nolon Rod. Paz MD   Signed by:   Lucious Groves CMA on 08/14/2010   Method used:   Printed then faxed to ...       [facility]  [address] [phone]* (retail)       [address]       [place], [place]  [address]       Ph: [phone]       Fax: [phone]   RxID:   [id number]

## 2010-10-03 NOTE — Progress Notes (Signed)
Summary: First Time Rx  Phone Note Refill Request Message from:  Patient on April 30, 2010 9:40 AM  Refills Requested: Medication #1:  ONE TOUCH ULTRA TEST STRIPS & LANCETS checks bs 1x/day dx 250.00   Dosage confirmed as above?Dosage Confirmed   Supply Requested: 3 months [facility] on [address], first time rx.   Initial call taken by: Harold Barban,  April 30, 2010 9:41 AM    New/Updated Medications: Dola Argyle LANCETS  MISC (LANCETS) Check BS as directed. ONETOUCH ULTRASOFT LANCETS  MISC (LANCETS) Check BS as directed- 250.00 Prescriptions: ONETOUCH ULTRASOFT LANCETS  MISC (LANCETS) Check BS as directed- 250.00  #100 x 6   Entered by:   Army Fossa CMA   Authorized by:   Nolon Rod. Paz MD   Signed by:   Army Fossa CMA on 04/30/2010   Method used:   Electronically to        [facility] [phone]* (retail)       [address]       [place], [place]  [address]       Ph: [phone]       Fax: [phone]   RxID:   [id number] Dola Argyle LANCETS  MISC (LANCETS) Check BS as directed.  #100 x 6   Entered by:   Army Fossa CMA   Authorized by:   Nolon Rod. Paz MD   Signed by:   Army Fossa CMA on 04/30/2010   Method used:   Electronically to        [facility] [phone]* (retail)       [address]       [place], [place]  [address]       Ph: [phone]       Fax: [phone]   RxID:   [id number]

## 2010-10-03 NOTE — Progress Notes (Signed)
Summary: refill request   Phone Note Refill Request Call back at [phone] Message from:  Pharmacy on May 02, 2010 10:52 AM  Refills Requested: Medication #1:  ALPRAZOLAM 0.5 MG  TABS one p.o. t.i.d. p.r.n. anxiety   Dosage confirmed as above?Dosage Confirmed   Supply Requested: 1 month [facility] [address]  Next Appointment Scheduled: 12.20.11 Initial call taken by: Lavell Islam,  May 02, 2010 10:53 AM  Follow-up for Phone Call        Last Refilled 02/20/10 x 1. Got 100 pills each time. Army Fossa CMA  May 02, 2010 10:56 AM too  early,see prescriptions [place] E. Paz MD  May 02, 2010 1:04 PM   Additional Follow-up for Phone Call Additional follow up Details #1::        Left message for pt to call back. Army Fossa CMA  May 02, 2010 1:09 PM     Additional Follow-up for Phone Call Additional follow up Details #2::    I spoke with pt she is aware. Army Fossa CMA  May 03, 2010 8:48 AM

## 2010-10-03 NOTE — Assessment & Plan Note (Signed)
Summary: 4 month followup and labs?///sph   Vital Signs:  Patient profile:   60 year old female Height:      66 inches Weight:      199.50 pounds BMI:     32.32 Temp:     97.8 degrees F oral Pulse rate:   82 / minute Pulse rhythm:   regular BP sitting:   130 / 84  (left arm) Cuff size:   large  Vitals Entered By: Army Fossa CMA (September 06, 2010 8:02 AM) CC: 4 month f/u- fasting  Comments when blowing nose, seeing blood -having HA's [facility]    History of Present Illness: 4 month f/u- fasting   nasal bloody d/c , frontal HA "around the eye" symptoms going on x weeks good medication compliance w/ allergy meds   AODM --  AM sugar  ~ 180, in the PM  ~ 150. did not astart metformin. has improved exercise , dieting    Hyperlipidemia-- good medication compliance, needs RF    Current Medications (verified): 1)  Allegra 180 Mg Tabs (Fexofenadine Hcl) .Marland Kitchen.. 1 By Mouth Qd 2)  Aspir-Low 81 Mg  Tbec (Aspirin) 3)  Veramyst 27.5 Mcg/spray Susp (Fluticasone Furoate) .... 2 Sprays On Each Side of The Nose Once Daily 4)  Alprazolam 0.5 Mg  Tabs (Alprazolam) .... One P.o. T.i.d. P.r.n. Anxiety, Two At Bedtime 5)  One Touch Ultra Test Strips & Lancets .... Checks Bs 1x/day Dx 250.00 6)  Pravachol 40 Mg Tabs (Pravastatin Sodium) .Marland Kitchen.. 1 By Mouth At Bedtime 7)  Advil 200 Mg Caps (Ibuprofen) .... Two Caps Every 6 Hours As Needed 8)  Vit D (Otc) 9)  Doxycycline Hyclate 100 Mg Caps (Doxycycline Hyclate) .... One By Mouth Twice A Day For 10 Days 10)  Astepro 0.15 % Soln (Azelastine Hcl) .... 2 Sprays On Each Side of The Nose Twice A Day 11)  Glucophage 1000 Mg Tabs (Metformin Hcl) .... Half Tablet Twice A Day 12)  Onetouch Delica Lancets  Misc (Lancets) .... Check Bs As Directed. 13)  Onetouch Ultrasoft Lancets  Misc (Lancets) .... Check Bs As Directed- 250.00  Allergies (verified): 1)  ! * Vytorin 2)  ! * Crestor 3)  ! Augmentin 4)  ! Codeine 5)  ! Sulfa 6)  ! Macrobid 7)  !  Prednisone 8)  ! Relafen 9)  ! * E-Mycin 10)  ! Avelox  Past History:  Past Medical History: Reviewed history from 04/11/2010 and no changes required. AODM Dx 04-2008 Allergic rhinitis Kidney Stone Metabolic Syndrome Menopause Insomnia Hyperlipidemia Anxiety  Past Surgical History: Reviewed history from 04/22/2007 and no changes required. Cholecystectomy  Social History: Reviewed history from 12/06/2009 and no changes required. Never Smoked Alcohol use-yes-socially Widow (1990) works 2 jobs  lives alone 1 daughter diet--  "ok"  lately  exercise--treadmill sometimes , active in the yard  Review of Systems General:  Denies fever. ENT:  (+) itchy eyes -nose . CV:  Denies chest pain or discomfort and swelling of feet; no claudication . Resp:  Denies wheezing; (+) cough thinks from PN drip  some green sputum . GI:  Denies nausea and vomiting; occasionally diarrhea .  Physical Exam  General:  alert and well-developed.   Head:  face symmetric Tender at both sides of the maxillary sinuses Ears:  R ear normal and L ear normal.   Mouth:  good dentition.  no redness or discharge Lungs:  normal respiratory effort, no intercostal retractions, no accessory muscle use, and  normal breath sounds.   Heart:  normal rate, regular rhythm, no murmur, and no gallop.   Extremities:  no pretibial edema bilaterally    Impression & Recommendations:  Problem # 1:  VITAMIN D DEFICIENCY (ICD-268.9) patient is committed to take vitamin D every day. Recheck levels on return to the office  Problem # 2:  AODM (ICD-250.00) patient decided not to start Glucophage but change her lifestyle.  Labs, patient stated that if not at goal, she will start medicines  The following medications were removed from the medication list:    Glucophage 1000 Mg Tabs (Metformin hcl) ..... Half tablet twice a day Her updated medication list for this problem includes:    Aspir-low 81 Mg Tbec  (Aspirin)  Orders: TLB-BMP (Basic Metabolic Panel-BMET) (80048-METABOL) TLB-A1C / Hgb A1C (Glycohemoglobin) (83036-A1C) TLB-Microalbumin/Creat Ratio, Urine (82043-MALB)  Problem # 3:  HYPERLIPIDEMIA (ICD-272.4)  RF, due for labs   Her updated medication list for this problem includes:    Pravachol 40 Mg Tabs (Pravastatin sodium) .Marland Kitchen... 1 by mouth at bedtime    Labs Reviewed: SGOT: 21 (08/08/2009)   SGPT: 20 (08/08/2009)   HDL:48.30 (12/06/2009), 39.20 (12/28/2008)  LDL:DEL (04/03/2008), DEL (04/22/2007)  Chol:210 (12/06/2009), 254 (12/28/2008)  Trig:180.0 (12/06/2009), 159.0 (12/28/2008)  Orders: Venipuncture (53664) TLB-ALT (SGPT) (84460-ALT) TLB-AST (SGOT) (84450-SGOT) TLB-Lipid Panel (80061-LIPID)  Problem # 4:  ALLERGIC RHINITIS (ICD-477.9)  Several months history of sinus congestion, patient wonders if she has sinusitis. she is actually not febrile, she does have itchy eyes itchy nose. I think she has chronic sinusitis, allergic?Marland Kitchen also tinnitus  We agreed for an ENT referral Her updated medication list for this problem includes:    Astepro 0.15 % Soln (Azelastine hcl) .Marland Kitchen... 2 sprays on each side of the nose twice a day    Veramyst 27.5 Mcg/spray Susp (Fluticasone furoate) .Marland Kitchen... 2 sprays on each side of the nose once daily    Allegra 180 Mg Tabs (Fexofenadine hcl) .Marland Kitchen... 1 by mouth qd  Orders: ENT Referral (ENT)  Problem # 5:  wonders about shingles immunization information from "up to date" provided; to get a shot whenever she is ready  Complete Medication List: 1)  Astepro 0.15 % Soln (Azelastine hcl) .... 2 sprays on each side of the nose twice a day 2)  Veramyst 27.5 Mcg/spray Susp (Fluticasone furoate) .... 2 sprays on each side of the nose once daily 3)  Allegra 180 Mg Tabs (Fexofenadine hcl) .Marland Kitchen.. 1 by mouth qd 4)  Aspir-low 81 Mg Tbec (Aspirin) 5)  Alprazolam 0.5 Mg Tabs (Alprazolam) .... One p.o. t.i.d. p.r.n. anxiety, two at bedtime 6)  One Touch Ultra  Test Strips & Lancets  .... Checks bs 1x/day dx 250.00 7)  Pravachol 40 Mg Tabs (Pravastatin sodium) .Marland Kitchen.. 1 by mouth at bedtime 8)  Advil 200 Mg Caps (Ibuprofen) .... Two caps every 6 hours as needed 9)  Vit D (otc)  10)  Onetouch Delica Lancets Misc (Lancets) .... Check bs as directed. 11)  Onetouch Ultrasoft Lancets Misc (Lancets) .... Check bs as directed- 250.00  Patient Instructions: 1)  Please schedule a follow-up appointment in 4 months  (physical) Prescriptions: ONETOUCH ULTRASOFT LANCETS  MISC (LANCETS) Check BS as directed- 250.00  #100 x 6   Entered by:   Army Fossa CMA   Authorized by:   Nolon Rod. Zipporah Finamore MD   Signed by:   Army Fossa CMA on 09/06/2010   Method used:   Electronically to  [facility]  [address] [phone]* (retail)       [address]       [place], [place]  [address]       Ph: [phone]       Fax: [phone]   RxID:   [id number] Dola Argyle LANCETS  MISC (LANCETS) Check BS as directed.  #100 x 6   Entered by:   Army Fossa CMA   Authorized by:   Nolon Rod. Aden Sek MD   Signed by:   Army Fossa CMA on 09/06/2010   Method used:   Electronically to        [facility] [phone]* (retail)       [address]       [place], [place]  [address]       Ph: [phone]       Fax: [phone]   RxID:   [id number] PRAVACHOL 40 MG TABS (PRAVASTATIN SODIUM) 1 by mouth at bedtime  #90 x 3   Entered by:   Army Fossa CMA   Authorized by:   Nolon Rod. Kanita Delage MD   Signed by:   Army Fossa CMA on 09/06/2010   Method used:   Electronically to        [facility] [phone]* (retail)       [address]       [place], [place]  [address]       Ph: [phone]       Fax: [phone]   RxID:   [id number] ALLEGRA 180 MG TABS (FEXOFENADINE HCL) 1 by mouth qd  #90 x 3   Entered by:   Army Fossa CMA   Authorized by:   Nolon Rod. Nobel Brar MD   Signed by:   Army Fossa CMA  on 09/06/2010   Method used:   Electronically to        [facility] [phone]* (retail)       [address]       [place], [place]  [address]       Ph: [phone]       Fax: [phone]   RxID:   [id number] VERAMYST 27.5 MCG/SPRAY SUSP (FLUTICASONE FUROATE) 2 sprays on each side of the nose once daily  #3 x 3   Entered by:   Army Fossa CMA   Authorized by:   Nolon Rod. Pattiann Solanki MD   Signed by:   Army Fossa CMA on 09/06/2010   Method used:   Electronically to        [phone]* (retail)       [address]       [place], [place]  [address]       Ph: [phone]       Fax: [phone]   RxID:   [id number] ASTEPRO 0.15 % SOLN (AZELASTINE HCL) 2 sprays on each side of the nose twice a day  #3 x 3   Entered by:   Army Fossa CMA   Authorized by:   Nolon Rod. Denzel Etienne MD   Signed by:   Army Fossa CMA on 09/06/2010   Method used:   Electronically to        [facility] [phone]* (retail)       [address]  [place], [place]  [address]       Ph: [phone]       Fax: [phone]   RxID:   [id number]    Orders Added: 1)  Venipuncture [40102] 2)  TLB-BMP (Basic Metabolic Panel-BMET) [80048-METABOL] 3)  TLB-ALT (SGPT) [84460-ALT] 4)  TLB-AST (SGOT) [84450-SGOT] 5)  TLB-A1C / Hgb A1C (Glycohemoglobin) [83036-A1C] 6)  TLB-Lipid Panel [80061-LIPID] 7)  TLB-Microalbumin/Creat Ratio, Urine [82043-MALB] 8)  New Patient Level V [99205] 9)  ENT Referral [ENT]

## 2010-10-03 NOTE — Letter (Signed)
Summary: [facility]: Immunoassay Fecal Occult Blood (iFOB) Order Form  [facility]  [address] [place], [place] [address]   Phone: [phone]  Fax: [phone]      [facility]: Immunoassay Fecal Occult Blood (iFOB) Order Form   December 06, 2009 MRN: [id number]   Nicole Duran October 01, 1950   Physicican Name:_________paz________________  Diagnosis Code:________v76.51__________________      Nicole Duran

## 2010-10-03 NOTE — Letter (Signed)
Summary: Generic Letter  [facility]  [address] [place], [place] [address]   Phone: [phone]  Fax: [phone]    01/01/2010  KATHRINA CROSLEY [address] [place], [place]  [address]  Dear Ms. Staron,   Please return the stool kit that was provided to you at your last office visit.  You can return it in the evelope that was provided with the kit.  Please call our office with any concerns.     Sincerely,   Shary Decamp

## 2010-10-03 NOTE — Letter (Signed)
Summary: Primary Care Appointment Letter  [facility]  [address] [place], [place] [address]   Phone: [phone]  Fax: [phone]    09/24/2009 MRN: [id number]  Nicole Duran [address] [place], [place]  [address]  Dear [name],   Your Primary Care Physician Toppenish E. Paz MD has indicated that:    ____X___it is time to schedule an appointment.  Please call our office to schedule a complete physical in April 2011.        Please call our office as soon as possible. Our phone number is [phone].     Thank you,     Primary Care Scheduler

## 2010-10-03 NOTE — Progress Notes (Signed)
Summary: refill  Phone Note Refill Request Message from:  Fax from Pharmacy on May 14, 2010 4:18 PM  Refills Requested: Medication #1:  ALPRAZOLAM 0.5 MG  TABS one p.o. t.i.d. p.r.n. anxiety [facility] - fax [phone]  Initial call taken by: Okey Regal [place],  May 14, 2010 4:19 PM  Follow-up for Phone Call        last filled 02/20/10 # 100 x 1   Additional Follow-up for Phone Call Additional follow up Details #1::        ok #100, one refill Additional Follow-up by: Jose E. Paz MD,  May 15, 2010 12:17 PM    Prescriptions: ALPRAZOLAM 0.5 MG  TABS (ALPRAZOLAM) one p.o. t.i.d. p.r.n. anxiety, two at bedtime  #100 x 1   Entered by:   Army Fossa CMA   Authorized by:   Nolon Rod. Paz MD   Signed by:   Army Fossa CMA on 05/15/2010   Method used:   Printed then faxed to ...       [facility]  [address] [phone]* (retail)       [address]       [place], [place]  [address]       Ph: [phone]       Fax: [phone]   RxID:   [id number]

## 2010-10-24 ENCOUNTER — Encounter: Payer: Self-pay | Admitting: Internal Medicine

## 2010-12-11 ENCOUNTER — Encounter: Payer: Self-pay | Admitting: Internal Medicine

## 2010-12-19 ENCOUNTER — Ambulatory Visit (INDEPENDENT_AMBULATORY_CARE_PROVIDER_SITE_OTHER): Payer: 59 | Admitting: Internal Medicine

## 2010-12-19 ENCOUNTER — Encounter: Payer: Self-pay | Admitting: Internal Medicine

## 2010-12-19 DIAGNOSIS — Z0000 Encounter for general adult medical examination without abnormal findings: Secondary | ICD-10-CM | POA: Insufficient documentation

## 2010-12-19 DIAGNOSIS — E785 Hyperlipidemia, unspecified: Secondary | ICD-10-CM

## 2010-12-19 DIAGNOSIS — E559 Vitamin D deficiency, unspecified: Secondary | ICD-10-CM

## 2010-12-19 DIAGNOSIS — E119 Type 2 diabetes mellitus without complications: Secondary | ICD-10-CM

## 2010-12-19 LAB — CBC WITH DIFFERENTIAL/PLATELET
Basophils Absolute: 0 10*3/uL (ref 0.0–0.1)
HCT: 44.5 % (ref 36.0–46.0)
Lymphs Abs: 2 10*3/uL (ref 0.7–4.0)
MCV: 96.5 fl (ref 78.0–100.0)
Monocytes Absolute: 0.3 10*3/uL (ref 0.1–1.0)
Neutrophils Relative %: 67.1 % (ref 43.0–77.0)
Platelets: 291 10*3/uL (ref 150.0–400.0)
RDW: 13.3 % (ref 11.5–14.6)

## 2010-12-19 LAB — TSH: TSH: 1.86 u[IU]/mL (ref 0.35–5.50)

## 2010-12-19 LAB — MICROALBUMIN / CREATININE URINE RATIO
Creatinine,U: 105.4 mg/dL
Microalb, Ur: 0.8 mg/dL (ref 0.0–1.9)

## 2010-12-19 LAB — LIPID PANEL
HDL: 47.4 mg/dL (ref >39.00)
Triglycerides: 182 mg/dL — ABNORMAL HIGH (ref 0.0–149.0)
VLDL: 36.4 mg/dL (ref 0.0–40.0)

## 2010-12-19 LAB — BASIC METABOLIC PANEL
Calcium: 9.8 mg/dL (ref 8.4–10.5)
GFR: 93.17 mL/min (ref >60.00)
Sodium: 142 mEq/L (ref 135–145)

## 2010-12-19 LAB — HEMOGLOBIN A1C: Hgb A1c MFr Bld: 7.2 % — ABNORMAL HIGH (ref 4.6–6.5)

## 2010-12-19 LAB — ALT: ALT: 18 U/L (ref 0–35)

## 2010-12-19 MED ORDER — SITAGLIPTIN PHOSPHATE 100 MG PO TABS: 100.0000 mg | ORAL_TABLET | Freq: Every day | ORAL | Status: DC

## 2010-12-19 NOTE — Assessment & Plan Note (Signed)
Has not taken Pravachol consistently. We'll check a lipid profile I told the patient that the nausea is not likely from Pravachol, rather from metformin. Depending on results, I think we'll have to re prescribe Pravachol

## 2010-12-19 NOTE — Assessment & Plan Note (Signed)
Labs Vitamin D  06-2009 was 26

## 2010-12-19 NOTE — Assessment & Plan Note (Addendum)
Unable to tolerate 2000 mg of metformin, taking only 1000 mg daily. Despite that, she continued with diarrhea.  Her CBGs have  been great with metformin but will have to discontinue it due to diarrhea. Start Januvia

## 2010-12-19 NOTE — Progress Notes (Signed)
Addended by: Doristine Devoid on: 12/19/2010 02:22 PM   Modules accepted: Orders

## 2010-12-19 NOTE — Assessment & Plan Note (Addendum)
Td 05 Shingles shot discussed  never had a Cscope ,  provided iFOB 2011, did not send back Patient is willing to have a colonoscopy now, will refer to GI  Gyn Dr Edward Jolly, PAP, MMG, DEXA per gyn   information provided regard to diet and exercise

## 2010-12-19 NOTE — Patient Instructions (Addendum)
Continue with your better diet and exercise Stop metformin, start Januvia Think about a shingle shot

## 2010-12-19 NOTE — Progress Notes (Signed)
Subjective:    Patient ID: Nicole Duran, female    DOB: [birth year], 60 y.o.   MRN: [id number]  HPI Here for a complete physical exam, chart is reviewed.  In addition we discussed the following: Diabetes, she started metformin several months ago, she was unable to tolerate 1000 mg twice a day due to nausea and diarrhea. Currently taking only 1000 mg daily and still has diarrhea. Denies any blood in the stools or mucus in the stools. Cholesterol, not taking Pravachol consistently, she wonders if the  nausea is from the Pravachol (I believe he is rather from metformin) Was referred to ENT for tinnitus, did not go.  Past Medical History  Diagnosis Date  . Kidney stone   . Metabolic syndrome   . Menopause   . Insomnia   . ALLERGIC RHINITIS 04/22/2007  . HYPERLIPIDEMIA 04/22/2007  . 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 Anxiety    Past Surgical History  Procedure Date  . Cholecystectomy    History   Social History  . Marital Status: Widowed    Spouse Name: N/A    Number of Children: 1  . Years of Education: N/A   Occupational History  .     Social History Main Topics  . Smoking status: Never Smoker   . Smokeless tobacco: Not on file  . Alcohol Use: 0.0 oz/week     socially  . Drug Use: No  . Sexually Active: Not on file   Other Topics Concern  . Not on file   Social History Narrative   Lives alone.Marland KitchenMarland KitchenMarland KitchenExercise and exercise --improved after she went to a nutritionist...Marland KitchenMarland KitchenWorks 2 jobs....   Family History  Problem Relation Age of Onset  . Coronary artery disease Father 62    CABG  . Stroke Father 52  . Diabetes Mother   . Dementia Mother     mother also had- PAD, psychosis  . Hypertension Mother   . Cancer Neg Hx     colon, breast  . Multiple myeloma Father 28    Review of Systems  Constitutional: Negative for fever.       Diet- exercise better, + WT loss  Respiratory: Negative for cough and shortness of breath.   Cardiovascular: Negative for chest pain, palpitations and  leg swelling.  Gastrointestinal: Negative for abdominal pain and blood in stool.  Genitourinary: Negative for dysuria and hematuria.  Musculoskeletal:       Occ muscle aches , joint aches        Objective:   Physical Exam  Constitutional: She is oriented to person, place, and time. She appears well-developed and well-nourished. No distress.  HENT:  Head: Normocephalic.  Eyes: No scleral icterus.  Neck: No thyromegaly present.       Normal carotid pulses  Cardiovascular: Normal rate and regular rhythm.   Murmur heard. Pulmonary/Chest: Effort normal and breath sounds normal. No respiratory distress. She has no wheezes. She has no rales.  Abdominal: Soft. Bowel sounds are normal. She exhibits no distension. There is no tenderness. There is no rebound and no guarding.       No bruit  Musculoskeletal:       No lower extremity edema Diabetic foot exam including pinprick examination is normal. Normal bilateral pedal pulses  Neurological: She is alert and oriented to person, place, and time.  Skin: Skin is warm and dry.  Psychiatric: She has a normal mood and affect. Thought content normal.          Assessment & Plan:

## 2010-12-20 ENCOUNTER — Telehealth: Payer: Self-pay | Admitting: *Deleted

## 2010-12-20 LAB — VITAMIN D 25 HYDROXY (VIT D DEFICIENCY, FRACTURES): Vit D, 25-Hydroxy: 36 ng/mL (ref 30–89)

## 2010-12-20 NOTE — Telephone Encounter (Signed)
Message left for patient to return my call.  

## 2010-12-20 NOTE — Telephone Encounter (Signed)
Message copied by Army Fossa on Fri Dec 20, 2010 10:05 AM ------      Message from: Nicole Duran      Created: Fri Dec 20, 2010  8:37 AM       Advise patient:      Diabetes: A1c has improved, is 7.2. Recommend to stay on Januvia- diet- exercise and come back in 3 months.      Cholesterol is elevated, recommend to restart Pravachol 40 mg one by mouth every night. Please call a prescription.      Other labs within normal, f/u 3 months

## 2010-12-23 ENCOUNTER — Other Ambulatory Visit: Payer: Self-pay | Admitting: *Deleted

## 2010-12-23 MED ORDER — ALPRAZOLAM 0.5 MG PO TABS: ORAL_TABLET | ORAL | Status: DC

## 2010-12-23 MED ORDER — PRAVASTATIN SODIUM 40 MG PO TABS: 40.0000 mg | ORAL_TABLET | Freq: Every evening | ORAL | Status: DC

## 2010-12-23 NOTE — Telephone Encounter (Signed)
100 and 1 RF

## 2010-12-23 NOTE — Telephone Encounter (Signed)
I spoke w/ pt she is aware.  

## 2011-01-17 NOTE — Op Note (Signed)
Nicole Duran, Nicole Duran                      ACCOUNT NO.:  [id number]   MEDICAL RECORD NO.:  [id number]                   PATIENT TYPE:  INP   LOCATION:  0447                                 FACILITY:  [facility]   PHYSICIAN:  Ollen Gross. Vernell Morgans, M.D.              DATE OF BIRTH:  10-May-1951   DATE OF PROCEDURE:  02/09/2003  DATE OF DISCHARGE:                                 OPERATIVE REPORT   PREOPERATIVE DIAGNOSIS:  Cholecystitis, cholelithiasis.   POSTOPERATIVE DIAGNOSIS:  Cholecystitis, cholelithiasis.   OPERATION/PROCEDURE:  Laparoscopic cholecystectomy.   SURGEON:  Ollen Gross. Carolynne Edouard, M.D.   ASSISTANT:  Sandria Bales. Ezzard Standing, M.D.   ANESTHESIA:  General endotracheal anesthesia.   DESCRIPTION OF PROCEDURE:  After informed consent was obtained, the patient  was brought to the operating room and placed in the supine position on the  operating room table.  After adequate induction of general anesthesia, the  patient's abdomen was prepped with Betadine and draped in the usual sterile  manner.  The area above the umbilicus was infiltrated with 0.25% Marcaine.  A small incision was made with the 15-blade knife.  This incision was  carried down through the subcutaneous tissue bluntly with the Kelly clamp  and [employer] retractors until the linea alba was identified.  The linea  alba was incised with the 15-blade knife and each side was grasped with  Kocher clamps and elevated anteriorly.  The preperitoneal space was then  prepped bluntly with a pair of hemostats.  The peritoneum was opened and  access was gained to the abdominal cavity.  A 0 Vicryl pursestring stitch  was placed in the fascia surrounding the opening. The Hasson cannula was  placed through the opening and anchored in place with the previously placed  Vicryl pursestring stitch.  The abdomen was then insufflated with carbon  dioxide without difficulty.  The laparoscope was placed through the Hasson  cannula and the patient was  placed in the head-up position.  The right upper  quadrant was inspected and the gallbladder was very large and tense and  inflamed.  The epigastric region was then infiltrated with 0.25% Marcaine.  A small incision was made with the 15-blade knife and a 10 mm port was  placed bluntly through this incision into a abdominal cavity under direct  vision.  Sites were then chosen laterally on the right side of the abdomen  with placement of a 5 mm ports.  Each of these areas was infiltrated with  0.25% Marcaine.  Small stab incisions were made with 15-blade knife and a 5  mm ports were placed bluntly through these incisions into the abdominal  cavity under direct vision.  An aspirating device was placed through one of  the lateral 5 mm ports.  The dome of the gallbladder was punctured and  aspirated.  Once this was complete, the aspirating device was removed.  The  blunt grasper was placed through  the lateral-most 5 mm port and used to  grasp the dome of the gallbladder and elevate it anteriorly and superiorly.  Another blunt grasper was placed in the other 5 mm port and used to retract  on the body and neck of the gallbladder.  The dissector was placed through  the epigastric port and using the electrocautery, the peritoneal reflection  at the gallbladder neck was opened.  Blunt dissection was then carried out  in this area until the gallbladder neck-cystic duct junction.  Bulb  dissection was then carried out in this area until a solid tubular structure  was identified at its joining with the gallbladder.  This structure was in  the area of the cystic artery.  A blunt dissection was carried out around  the structure until a good window was created and the dissection was carried  out so that the window was just adjacent to the wall of the gallbladder.  Again this was the location where you would expect to see the cystic artery.  Two clips were placed proximally and one distally on the structure  and the  structure was divided between the two.  At that point, it appeared that the  structure was the cystic duct and not the cystic artery.  Posterior to this  the cystic artery was identified, again dissected in a circumferential  manner.  Posterior to this the cystic branches to the cystic artery were  identified and again dissected in a circumferentially manner bluntly until a  good window was created.  Two clips were placed proximally and one distally  on each of the branches.  The branches were divided between the two with  laparoscopic scissors.  At this point it was felt that if we removed the  clips from the cystic duct that the duct would retract back and we would  lose control of the cystic duct.  We did keep our dissection right next to  the gallbladder and the dissection was very safe and away from the common  duct, so the attempts a the cholangiogram were aborted.  Next, the  laparoscopic cautery device was used to separate the gallbladder from the  liver bed prior to completely detaching the gallbladder from the liver bed.  The liver bed was inspected and several small bleeding points were  coagulated with the electrocautery.  The gallbladder was then detached the  rest of the way from the liver bed without difficulty.  An endoscopic bag  was placed through the epigastric port and the gallbladder was placed within  the bag and the bag was sealed. The abdomen was then irrigated with copious  amounts of saline until the effluent was clear.  The liver bed was again  inspected and found to be hemostatic.  A piece of Surgicel was placed in the  bed of the liver where the gallbladder had been.  The laparoscopic was then  moved to the epigastric port and a gallbladder grasper was placed through  the Hasson cannula and used to grasp the opening of the bag.  The bag with the gallbladder was then removed through the supraumbilical port without  difficulty.  The fascial defect was  closed with the previously placed Vicryl  pursestring stitch as well as with an extra figure-of-eight 0 Vicryl stitch.  The rest of the ports were removed under direct vision and found to be  hemostatic.  The gas was allowed to escape.  The skin incisions were all  closed with interrupted 4-0  Monocryl subcuticular stitches.  Benzoin and  Steri-Strips were applied.  The patient tolerated the procedure well.  At  the end of the case all needle, sponge and instrument counts were correct.  The patient was awakened and taken to the recovery room in stable condition.                                                Ollen Gross. Vernell Morgans, M.D.    PST/MEDQ  D:  02/09/2003  T:  02/09/2003  Job:  [id number]

## 2011-01-21 ENCOUNTER — Other Ambulatory Visit: Payer: 59 | Admitting: Gastroenterology

## 2011-03-04 ENCOUNTER — Other Ambulatory Visit: Payer: Self-pay | Admitting: Internal Medicine

## 2011-03-04 MED ORDER — ALPRAZOLAM 0.5 MG PO TABS: ORAL_TABLET | ORAL | Status: DC

## 2011-03-04 NOTE — Telephone Encounter (Signed)
100, 4 RF

## 2011-03-18 ENCOUNTER — Other Ambulatory Visit: Payer: Self-pay | Admitting: Internal Medicine

## 2011-03-18 DIAGNOSIS — E785 Hyperlipidemia, unspecified: Secondary | ICD-10-CM

## 2011-03-19 ENCOUNTER — Other Ambulatory Visit (INDEPENDENT_AMBULATORY_CARE_PROVIDER_SITE_OTHER): Payer: 59

## 2011-03-19 DIAGNOSIS — E785 Hyperlipidemia, unspecified: Secondary | ICD-10-CM

## 2011-03-19 LAB — LIPID PANEL
HDL: 44.4 mg/dL (ref >39.00)
LDL Cholesterol: 127 mg/dL — ABNORMAL HIGH (ref 0–99)
Total CHOL/HDL Ratio: 4
Triglycerides: 137 mg/dL (ref 0.0–149.0)
VLDL: 27.4 mg/dL (ref 0.0–40.0)

## 2011-03-19 NOTE — Progress Notes (Signed)
Labs only

## 2011-03-20 ENCOUNTER — Ambulatory Visit: Payer: 59 | Admitting: Internal Medicine

## 2011-03-21 ENCOUNTER — Telehealth: Payer: Self-pay | Admitting: *Deleted

## 2011-03-21 MED ORDER — GLIMEPIRIDE 2 MG PO TABS: 2.0000 mg | ORAL_TABLET | ORAL | Status: DC

## 2011-03-21 NOTE — Telephone Encounter (Signed)
Message copied by Leanne Lovely on Fri Mar 21, 2011  3:05 PM ------      Message from: Willow Ora E      Created: Fri Mar 21, 2011  1:54 PM       Advise patient:      A1 C. stable, needs better control. Intolerant to metformin thus will add  Amaryl 2 mg one by mouth every morning. Watch for low blood sugars.      Cholesterol has improved significantly but still not at goal. Instead of changing her cholesterol medication, continue with the same and  focus on diet and exercise.      OV  in 3 months, fasting

## 2011-03-21 NOTE — Telephone Encounter (Signed)
Message left for patient to return my call.  

## 2011-03-21 NOTE — Telephone Encounter (Signed)
I spoke w/ pt she is aware.  

## 2011-03-24 ENCOUNTER — Ambulatory Visit: Payer: 59 | Admitting: Internal Medicine

## 2011-03-24 ENCOUNTER — Telehealth: Payer: Self-pay | Admitting: *Deleted

## 2011-03-24 NOTE — Telephone Encounter (Signed)
I spoke w/ pt she states that her BS is now in the 70's, she is dizzy. She would like to know if she should see an endo?

## 2011-03-25 NOTE — Telephone Encounter (Signed)
I spoke w/ pt she is aware, she would like to see Dr.Ellison.

## 2011-03-25 NOTE — Telephone Encounter (Signed)
We can decrease amaryl 2 mg tablet to 1/2 a day, f/u as schedule , I don't see the need for endocrinology referral at this point  but ok to refer if  She really likes to

## 2011-04-10 ENCOUNTER — Other Ambulatory Visit: Payer: Self-pay | Admitting: *Deleted

## 2011-04-10 ENCOUNTER — Encounter: Payer: Self-pay | Admitting: Endocrinology

## 2011-04-10 ENCOUNTER — Ambulatory Visit (INDEPENDENT_AMBULATORY_CARE_PROVIDER_SITE_OTHER): Payer: 59 | Admitting: Endocrinology

## 2011-04-10 DIAGNOSIS — E119 Type 2 diabetes mellitus without complications: Secondary | ICD-10-CM

## 2011-04-10 MED ORDER — GLUCOSE BLOOD VI STRP: 1.0000 | ORAL_STRIP | Freq: Every day | Status: DC

## 2011-04-10 NOTE — Patient Instructions (Addendum)
good diet and exercise habits significanly improve the control of your diabetes.  please let me know if you wish to be referred to a dietician.  high blood sugar is very risky to your health.  you should see an eye doctor every year. controlling your blood pressure and cholesterol drastically reduces the damage diabetes does to your body.  this also applies to quitting smoking.  please discuss these with your doctor.  you should take an aspirin every day, unless you have been advised by a doctor not to. check your blood sugar 1 time a day.  vary the time of day when you check, between before the 3 meals, and at bedtime.  also check if you have symptoms of your blood sugar being too high or too low.  please keep a record of the readings and bring it to your next appointment here.  please call us sooner if you are having low blood sugar episodes. Stop glimepiride.  Please consider the list of medications you could add-on.  My preference would be to re-try the metformin at just 500 mg daily.   Please make a follow-up appointment in 3 months

## 2011-04-10 NOTE — Progress Notes (Signed)
Subjective:    Patient ID: Nicole Duran, female    DOB: 10-17-1950, 60 y.o.   MRN: [id number]  HPI pt states 1 year h/o dm.  she is unaware of any chronic complications.  he has never been on insulin.  A few weeks ago, amaryl was added to [place].  Since then, she is having hypoglycemia.  She had to stop metformin due to numbness of the lips.  pt says her diet and exercise are good.   She reports when she has hypoglycemia, she gets few minutes of slight tremor of the hands, and assoc dizziness.  It is resolved by eating or drinking.   Past Medical History  Diagnosis Date  . Kidney stone   . Metabolic syndrome   . Menopause   . Insomnia   . ALLERGIC RHINITIS 04/22/2007  . HYPERLIPIDEMIA 04/22/2007  . 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 Anxiety     Past Surgical History  Procedure Date  . Cholecystectomy     History   Social History  . Marital Status: Widowed    Spouse Name: N/A    Number of Children: 1  . Years of Education: N/A   Occupational History  .     Social History Main Topics  . Smoking status: Never Smoker   . Smokeless tobacco: Not on file  . Alcohol Use: 0.0 oz/week     socially  . Drug Use: No  . Sexually Active: Not on file   Other Topics Concern  . Not on file   Social History Narrative   Lives alone.Marland KitchenMarland KitchenMarland KitchenExercise and exercise --improved after she went to a nutritionist...Marland KitchenMarland KitchenWorks 2 jobs....    Current Outpatient Prescriptions on File Prior to Visit  Medication Sig Dispense Refill  . ALPRAZolam (XANAX) 0.5 MG tablet One by mouth three times a day as needed for anxiety, two at bedtime.  100 tablet  4  . aspirin 81 MG tablet Take 81 mg by mouth daily.        . fexofenadine (ALLEGRA) 180 MG tablet Take 180 mg by mouth daily.        Marland Kitchen glimepiride (AMARYL) 2 MG tablet Take 1 mg by mouth every morning.        Marland Kitchen glucose blood (ONE TOUCH TEST STRIPS) test strip 1 each by Other route as needed. Use as instructed       . ONETOUCH DELICA LANCETS MISC by Does not apply route.  1x daily/ 250.00       . pravastatin (PRAVACHOL) 40 MG tablet Take 1 tablet (40 mg total) by mouth every evening.  30 tablet  2  . sitaGLIPtan (JANUVIA) 100 MG tablet Take 1 tablet (100 mg total) by mouth daily.  30 tablet  4  . Azelastine HCl (ASTEPRO) 0.15 % SOLN by Nasal route. 2 sprays on each side of the nose twice a day.       . fluticasone (VERAMYST) 27.5 MCG/SPRAY nasal spray 2 sprays by Nasal route daily.        Marland Kitchen ibuprofen (ADVIL,MOTRIN) 200 MG tablet Take 200 mg by mouth every 6 (six) hours as needed.        Marland Kitchen VITAMIN D, CHOLECALCIFEROL, PO Take by mouth.          Allergies  Allergen Reactions  . Codeine     REACTION: n/v  . Ezetimibe-Simvastatin     REACTION: nausea  . NWG:NFAOZHYQMVH+QIONGEXBM+WUXLKGMWNU Acid+Aspartame     REACTION: n/v  . Moxifloxacin     REACTION: pt states made her "deathly  sick" -- nose bleeds  . Nabumetone     REACTION: nausea  . Nitrofurantoin     REACTION: n/v  . Prednisone     REACTION: rash  . Rosuvastatin     REACTION: pain  . Sulfonamide Derivatives     REACTION: nausea    Family History  Problem Relation Age of Onset  . Coronary artery disease Father 83    CABG  . Stroke Father 76  . Diabetes Mother   . Dementia Mother     mother also had- PAD, psychosis  . Hypertension Mother   . Cancer Neg Hx     colon, breast  . Multiple myeloma Father 70    BP 120/64  Pulse 57  Temp(Src) 98.7 F (37.1 C) (Oral)  Ht 5\' 6"  (1.676 m)  Wt 186 lb 9.6 oz (84.641 kg)  BMI 30.12 kg/m2  SpO2 96%  Review of Systems denies blurry vision, chest pain, sob, n/v, urinary frequency, cramps, excessive diaphoresis, memory loss, depression, menopausal sxs, and rhinorrhea.  She has lost weight, due to her efforts.   She report headache and easy bruising.      Objective:   Physical Exam VS: see vs page GEN: no distress HEAD: head: no deformity eyes: no periorbital swelling, no proptosis external nose and ears are normal mouth: no lesion  seen NECK: supple, thyroid is not enlarged CHEST WALL: no deformity CV: reg rate and rhythm, no murmur ABD: abdomen is soft, nontender.  no hepatosplenomegaly.  not distended.  no hernia MUSCULOSKELETAL: muscle bulk and strength are grossly normal.  no obvious joint swelling.  gait is normal and steady EXTEMITIES: no deformity.  no ulcer on the feet.  feet are of normal color and temp.  no edema PULSES: dorsalis pedis intact bilat.  no carotid bruit NEURO:  cn 2-12 grossly intact.   readily moves all 4's.  sensation is intact to touch on the feet SKIN:  Normal texture and temperature.  No rash or suspicious lesion is visible.   NODES:  None palpable at the neck PSYCH: alert, oriented x3.  Does not appear anxious nor depressed. Lab Results  Component Value Date   HGBA1C 7.1* 03/19/2011      Assessment & Plan:  Dm, Needs increased rx, if it can be done with a regimen that avoids or minimizes hypoglycemia. Weight loss, due to her efforts Lip swelling.  This was prob a coincidence, and not related to metformin

## 2011-04-10 NOTE — Telephone Encounter (Signed)
Pt received new meter at [facility] today and needs rx for test strips sent to [facility]. Left message for pt to callback for brand name of meter

## 2011-04-11 MED ORDER — ACCU-CHEK MULTICLIX LANCETS MISC: Status: AC

## 2011-04-11 MED ORDER — ACCU-CHEK MULTICLIX LANCET DEV KIT: 1.0000 | PACK | Freq: Once | Status: DC

## 2011-05-12 ENCOUNTER — Other Ambulatory Visit: Payer: Self-pay | Admitting: Internal Medicine

## 2011-05-13 NOTE — Telephone Encounter (Signed)
Rx Done . 

## 2011-05-29 ENCOUNTER — Other Ambulatory Visit: Payer: Self-pay | Admitting: Obstetrics and Gynecology

## 2011-06-05 ENCOUNTER — Other Ambulatory Visit: Payer: Self-pay | Admitting: Obstetrics and Gynecology

## 2011-06-05 DIAGNOSIS — Z780 Asymptomatic menopausal state: Secondary | ICD-10-CM

## 2011-07-01 ENCOUNTER — Other Ambulatory Visit: Payer: Self-pay | Admitting: Internal Medicine

## 2011-07-16 ENCOUNTER — Other Ambulatory Visit: Payer: 59

## 2011-08-07 ENCOUNTER — Other Ambulatory Visit (INDEPENDENT_AMBULATORY_CARE_PROVIDER_SITE_OTHER): Payer: 59

## 2011-08-07 ENCOUNTER — Ambulatory Visit (INDEPENDENT_AMBULATORY_CARE_PROVIDER_SITE_OTHER): Payer: 59 | Admitting: Endocrinology

## 2011-08-07 ENCOUNTER — Ambulatory Visit
Admission: RE | Admit: 2011-08-07 | Discharge: 2011-08-07 | Disposition: A | Discharge status: Home / Self Care | Payer: 59 | Source: Ambulatory Visit | Attending: Obstetrics and Gynecology | Admitting: Obstetrics and Gynecology

## 2011-08-07 ENCOUNTER — Encounter: Payer: Self-pay | Admitting: Endocrinology

## 2011-08-07 VITALS — BP 102/64 | HR 82 | Temp 98.7°F | Ht 66.0 in | Wt 181.8 lb

## 2011-08-07 DIAGNOSIS — Z780 Asymptomatic menopausal state: Secondary | ICD-10-CM

## 2011-08-07 DIAGNOSIS — M8580 Other specified disorders of bone density and structure, unspecified site: Secondary | ICD-10-CM | POA: Insufficient documentation

## 2011-08-07 DIAGNOSIS — E119 Type 2 diabetes mellitus without complications: Secondary | ICD-10-CM

## 2011-08-07 LAB — HEMOGLOBIN A1C: Hgb A1c MFr Bld: 6.4 % (ref 4.6–6.5)

## 2011-08-07 MED ORDER — CEFUROXIME AXETIL 250 MG PO TABS: 250.0000 mg | ORAL_TABLET | Freq: Two times a day (BID) | ORAL | Status: AC

## 2011-08-07 NOTE — Progress Notes (Signed)
Subjective:    Patient ID: Nicole Duran, female    DOB: 13-Dec-1950, 60 y.o.   MRN: [id number]  HPI Pt states few weeks of moderate left otalgia, and assoc sore throat.  She says dr Drue Novel has advised her to see dr Pollyann Kennedy, but she hasn't gone yet. Pt returns for f/u of type 2 DM (2011).  no cbg record, but states cbg's are well-controlled.  She does not want to add-on any other medications for dm.  Past Medical History  Diagnosis Date  . Kidney stone   . Metabolic syndrome   . Menopause   . Insomnia   . ALLERGIC RHINITIS 04/22/2007  . HYPERLIPIDEMIA 04/22/2007  . 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 Anxiety     Past Surgical History  Procedure Date  . Cholecystectomy     History   Social History  . Marital Status: Widowed    Spouse Name: N/A    Number of Children: 1  . Years of Education: N/A   Occupational History  .     Social History Main Topics  . Smoking status: Never Smoker   . Smokeless tobacco: Not on file  . Alcohol Use: 0.0 oz/week     socially  . Drug Use: No  . Sexually Active: Not on file   Other Topics Concern  . Not on file   Social History Narrative   Lives alone.Marland KitchenMarland KitchenMarland KitchenExercise and exercise --improved after she went to a nutritionist...Marland KitchenMarland KitchenWorks 2 jobs....    Current Outpatient Prescriptions on File Prior to Visit  Medication Sig Dispense Refill  . ALPRAZolam (XANAX) 0.5 MG tablet One by mouth three times a day as needed for anxiety, two at bedtime.  100 tablet  4  . aspirin 81 MG tablet Take 81 mg by mouth daily.        . Azelastine HCl (ASTEPRO) 0.15 % SOLN by Nasal route. 2 sprays on each side of the nose twice a day.       . fexofenadine (ALLEGRA) 180 MG tablet Take 180 mg by mouth daily.        . fluticasone (VERAMYST) 27.5 MCG/SPRAY nasal spray 2 sprays by Nasal route daily.        Marland Kitchen glucose blood (ACCU-CHEK AVIVA) test strip 1 each by Other route daily. And lancets 250.00  100 each  5  . ibuprofen (ADVIL,MOTRIN) 200 MG tablet Take 200 mg by mouth every 6 (six)  hours as needed.        Marland Kitchen JANUVIA 100 MG tablet TAKE 1 TABLET (100 MG TOTAL)  BY MOUTH DAILY.  30 each  3  . Lancets (ACCU-CHEK MULTICLIX) lancets Use as instructed to test blood sugar once daily  102 each  3  . Lancets Misc. (ACCU-CHEK MULTICLIX LANCET DEV) KIT 1 Device by Does not apply route once.  1 each  0  . ONETOUCH DELICA LANCETS MISC by Does not apply route. 1x daily/ 250.00       . pravastatin (PRAVACHOL) 40 MG tablet TAKE 1 TABLET (40 MG TOTAL) BY MOUTH EVERY EVENING.  30 tablet  1  . VITAMIN D, CHOLECALCIFEROL, PO Take by mouth.          Allergies  Allergen Reactions  . Codeine     REACTION: n/v  . Ezetimibe-Simvastatin     REACTION: nausea  . NWG:NFAOZHYQMVH+QIONGEXBM+WUXLKGMWNU Acid+Aspartame     REACTION: n/v  . Metformin And Related     Nausea and lip swelling  . Moxifloxacin     REACTION: pt states made  her "deathly sick" -- nose bleeds  . Nabumetone     REACTION: nausea  . Nitrofurantoin     REACTION: n/v  . Prednisone     REACTION: rash  . Rosuvastatin     REACTION: pain  . Sulfonamide Derivatives     REACTION: nausea    Family History  Problem Relation Age of Onset  . Coronary artery disease Father 17    CABG  . Stroke Father 65  . Diabetes Mother   . Dementia Mother     mother also had- PAD, psychosis  . Hypertension Mother   . Cancer Neg Hx     colon, breast  . Multiple myeloma Father 70    BP 102/64  Pulse 82  Temp(Src) 98.7 F (37.1 C) (Oral)  Ht 5\' 6"  (1.676 m)  Wt 181 lb 12.8 oz (82.464 kg)  BMI 29.34 kg/m2  SpO2 99%    Review of Systems Denies fever and hypoglycemia    Objective:   Physical Exam VITAL SIGNS:  See vs page GENERAL: no distress head: no deformity eyes: no periorbital swelling, no proptosis external nose and ears are normal mouth: no lesion seen Both tm's are slightly red. Pulses: dorsalis pedis intact bilat.   Feet: no deformity.  no ulcer on the feet.  feet are of normal color and temp.  no edema Neuro:  sensation is intact to touch on the feet  Lab Results  Component Value Date   HGBA1C 6.4 08/07/2011      Assessment & Plan:  Glenford Peers, new DM, well-controlled

## 2011-08-07 NOTE — Patient Instructions (Addendum)
check your blood sugar 1 time a day.  vary the time of day when you check, between before the 3 meals, and at bedtime.  also check if you have symptoms of your blood sugar being too high or too low.  please keep a record of the readings and bring it to your next appointment here.  please call us sooner if you are having low blood sugar episodes. blood tests are being requested for you today.  please call [phone] to hear your test results.  You will be prompted to enter the 9-digit "MRN" number that appears at the top left of this page, followed by #.  Then you will hear the message. Please make a follow-up appointment in 3 months.  i have sent a prescription to your pharmacy, for an antibiotic Please also see dr Pollyann Kennedy to follow-up your sinus problems. (update: i left message on phone-tree:  rx as we discussed)

## 2011-08-13 DIAGNOSIS — Z8742 Personal history of other diseases of the female genital tract: Secondary | ICD-10-CM | POA: Insufficient documentation

## 2011-09-01 ENCOUNTER — Other Ambulatory Visit: Payer: Self-pay | Admitting: Internal Medicine

## 2011-09-01 MED ORDER — SITAGLIPTIN PHOSPHATE 100 MG PO TABS: 100.0000 mg | ORAL_TABLET | Freq: Every day | ORAL | Status: DC

## 2011-09-01 NOTE — Telephone Encounter (Signed)
Faxed.   KP 

## 2011-09-04 ENCOUNTER — Other Ambulatory Visit: Payer: Self-pay | Admitting: Internal Medicine

## 2011-09-08 ENCOUNTER — Telehealth: Payer: Self-pay

## 2011-09-08 NOTE — Telephone Encounter (Signed)
Spoke with pt's daughter and she states her mother was at work today and was feeling sick. Pt was given an antibotic back in Dec. 2012 by Dr. Everardo All so pt took 1/2 tablet and had a reaction.  The ambulance was called and pt was given 50 mg of Benadryl.  Pt's daughter states pt is at home with bad cold chills and cannot get out of bed.  Pt is requesting an office visit today.  Called pt.  Per Dr. Drue Novel pt can have an appt on 09/09/11 but feels like pt needs to be seen today at ER.  Pt advised and declined going to ER.  Pt was offered to go to ER when ambulance came and pt declined.  Pt states she has bad chills, congestion, shaking, nausea, vomiting and diarrhea.  Pt aware if symptoms worsen to go to ER asap.   Pt aware of appt on 09/09/11.

## 2011-09-09 ENCOUNTER — Ambulatory Visit (INDEPENDENT_AMBULATORY_CARE_PROVIDER_SITE_OTHER): Payer: 59 | Admitting: Internal Medicine

## 2011-09-09 VITALS — BP 110/62 | HR 91 | Temp 98.8°F | Ht 67.0 in | Wt 184.0 lb

## 2011-09-09 DIAGNOSIS — T7840XA Allergy, unspecified, initial encounter: Secondary | ICD-10-CM

## 2011-09-09 DIAGNOSIS — J069 Acute upper respiratory infection, unspecified: Secondary | ICD-10-CM

## 2011-09-09 NOTE — Progress Notes (Signed)
  Subjective:    Patient ID: Nicole Duran, female    DOB: [birth year], 61 y.o.   MRN: [id number]  HPI Acute visit  Was prescribed cefuroxime last month which she took without a problem. Developed a URI 3 days ago, yesterday she took a half of leftover cefuroxime , immediately after she developed a generalized rash, palpitations and shortness of breath. 911 was called, they gave her Benadryl immediately, she started to feel better and declined to go to the hospital. Today, the rash is better but not completely gone. She is still itching some.  Past Medical History  Diagnosis Date  . Kidney stone   . Metabolic syndrome   . Menopause   . Insomnia   . ALLERGIC RHINITIS 04/22/2007  . HYPERLIPIDEMIA 04/22/2007  . 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 Anxiety    Past Surgical History  Procedure Date  . Cholecystectomy      Review of Systems No tongue swelling, question of swollen lips yesterday. As far as a URI, she still has some chest congestion, sore throat. She had fever yesterday but not today.     Objective:   Physical Exam  Constitutional: She is oriented to person, place, and time. She appears well-developed and well-nourished.  HENT:  Head: Normocephalic and atraumatic.  Right Ear: External ear normal.  Left Ear: External ear normal.  Mouth/Throat: No oropharyngeal exudate.       Lips and tongue normal to inspection  Cardiovascular: Normal rate, regular rhythm and normal heart sounds.   No murmur heard. Pulmonary/Chest: Effort normal and breath sounds normal. No respiratory distress. She has no wheezes. She has no rales.  Neurological: She is alert and oriented to person, place, and time.  Skin:       Generalized, scattered, macular, slightly red skin spots .  Psychiatric: She has a normal mood and affect. Her behavior is normal. Judgment and thought content normal.       Assessment & Plan:  Allergic reaction to cefuroxime, see instructions. URI: Will try conservative treatment,  see instructions. She is allergic to multiple medicines, we'll avoid antibiotics if possible. Diabetes: Now under the care of Dr. Everardo All

## 2011-09-09 NOTE — Patient Instructions (Addendum)
For now on, avoid cefuroxime Zyrtec 10 mg over-the-counter now and  every day. Benadryl 25 mg at bedtime for a few days ER if allergy resurface --------------------------------- Rest, fluids , tylenol For cough, take Mucinex DM twice a day as needed  Call if no better in few days Call anytime if the symptoms are severe

## 2011-09-10 ENCOUNTER — Encounter: Payer: Self-pay | Admitting: Internal Medicine

## 2011-09-13 ENCOUNTER — Other Ambulatory Visit: Payer: Self-pay | Admitting: Internal Medicine

## 2011-09-15 ENCOUNTER — Other Ambulatory Visit: Payer: Self-pay | Admitting: Internal Medicine

## 2011-09-30 ENCOUNTER — Telehealth: Payer: Self-pay | Admitting: Endocrinology

## 2011-09-30 MED ORDER — GLUCOSE BLOOD VI STRP: 1.0000 | ORAL_STRIP | Freq: Every day | Status: DC

## 2011-09-30 NOTE — Telephone Encounter (Signed)
The pt called and is hoping get an rx for test strips and needles for her Accu Check Nano meter.  She states she gets a drum with 6 needles X100.  Thanks!

## 2011-10-10 ENCOUNTER — Other Ambulatory Visit: Payer: Self-pay | Admitting: Internal Medicine

## 2011-10-13 NOTE — Telephone Encounter (Signed)
Refill request xanax 0.5mg  #100 with 3 refills. OK to refill?

## 2011-10-14 NOTE — Telephone Encounter (Signed)
Ok 100 , 4 RF

## 2011-10-15 ENCOUNTER — Telehealth: Payer: Self-pay

## 2011-10-15 NOTE — Telephone Encounter (Signed)
Message left on Triage voicemail: patient would like a call about 2 meds that were to be sent to [facility] (no additional information given)

## 2011-10-15 NOTE — Telephone Encounter (Signed)
Please call the patient, find out what the issue is.

## 2011-10-15 NOTE — Telephone Encounter (Signed)
Refill done.  

## 2011-10-15 NOTE — Telephone Encounter (Signed)
LMOVM to return call.

## 2011-10-21 NOTE — Telephone Encounter (Signed)
Phoned pt again & LMOVM to return call.  

## 2011-10-28 ENCOUNTER — Telehealth: Payer: Self-pay | Admitting: *Deleted

## 2011-10-28 ENCOUNTER — Telehealth: Payer: Self-pay

## 2011-10-28 NOTE — Telephone Encounter (Signed)
Prior Auth approved 10-28-11-10-27-14

## 2011-10-28 NOTE — Telephone Encounter (Signed)
Spoke with patient on work number, patient states she just had a question as to why it took so long for response to refill request on Lorazepam. Patient was informed of the process for refilling controlled medications  Patient stated she finally got it filled and has no other concerns as of right now.

## 2011-10-28 NOTE — Telephone Encounter (Signed)
ERROR

## 2012-01-29 ENCOUNTER — Ambulatory Visit (INDEPENDENT_AMBULATORY_CARE_PROVIDER_SITE_OTHER): Payer: 59 | Admitting: Endocrinology

## 2012-01-29 ENCOUNTER — Encounter: Payer: Self-pay | Admitting: Endocrinology

## 2012-01-29 ENCOUNTER — Other Ambulatory Visit (INDEPENDENT_AMBULATORY_CARE_PROVIDER_SITE_OTHER): Payer: 59

## 2012-01-29 ENCOUNTER — Other Ambulatory Visit: Payer: Self-pay | Admitting: Internal Medicine

## 2012-01-29 VITALS — BP 110/72 | HR 70 | Temp 97.2°F | Ht 66.5 in | Wt 188.0 lb

## 2012-01-29 DIAGNOSIS — E119 Type 2 diabetes mellitus without complications: Secondary | ICD-10-CM

## 2012-01-29 NOTE — Progress Notes (Signed)
Subjective:    Patient ID: Nicole Duran, female    DOB: 07/23/1951, 61 y.o.   MRN: [id number]  HPI Pt returns for f/u of type 2 DM (2011; no known complications).  no cbg record, but states cbg's are well-controlled.  pt states she feels well in general.  She does not want to add-on any other medications for dm.   Past Medical History  Diagnosis Date  . Kidney stone   . Metabolic syndrome   . Menopause   . Insomnia   . ALLERGIC RHINITIS 04/22/2007  . HYPERLIPIDEMIA 04/22/2007  . 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 Anxiety     Past Surgical History  Procedure Date  . Cholecystectomy     History   Social History  . Marital Status: Widowed    Spouse Name: N/A    Number of Children: 1  . Years of Education: N/A   Occupational History  .     Social History Main Topics  . Smoking status: Never Smoker   . Smokeless tobacco: Not on file  . Alcohol Use: 0.0 oz/week     socially  . Drug Use: No  . Sexually Active: Not on file   Other Topics Concern  . Not on file   Social History Narrative   Lives alone.Marland KitchenMarland KitchenMarland KitchenExercise and exercise --improved after she went to a nutritionist...Marland KitchenMarland KitchenWorks 2 jobs....    Current Outpatient Prescriptions on File Prior to Visit  Medication Sig Dispense Refill  . ALPRAZolam (XANAX) 0.5 MG tablet TAKE ONE TABLET THREE TIMES DAILY AS NEEDED FOR ANXIETY. TWO TABLETSAT BEDTIME AS DIRECTED BY DOCTOR.  100 tablet  4  . aspirin 81 MG tablet Take 81 mg by mouth daily.        . ASTEPRO 0.15 % SOLN 2 SPRAYS ON EACH SIDE OF THE  NOSE TWICE A DAY  30 mL  2  . fexofenadine (ALLEGRA) 180 MG tablet Take 180 mg by mouth daily.        Marland Kitchen glucose blood (ACCU-CHEK AVIVA) test strip 1 each by Other route daily. And lancets 250.00  100 each  5  . ibuprofen (ADVIL,MOTRIN) 200 MG tablet Take 200 mg by mouth every 6 (six) hours as needed.        . Lancets (ACCU-CHEK MULTICLIX) lancets Use as instructed to test blood sugar once daily  102 each  3  . Lancets Misc. (ACCU-CHEK MULTICLIX  LANCET DEV) KIT 1 Device by Does not apply route once.  1 each  0  . pravastatin (PRAVACHOL) 40 MG tablet TAKE 1 TABLET (40 MG TOTAL) BY MOUTH EVERY EVENING.  30 tablet  1  . sitaGLIPtin (JANUVIA) 100 MG tablet Take 1 tablet (100 mg total) by mouth daily.  30 tablet  5  . VERAMYST 27.5 MCG/SPRAY nasal spray 2 SPRAYS ON EACH SIDE OF THE  NOSE ONCE DAILY  10 g  1    Allergies  Allergen Reactions  . Cefuroxime Axetil Hives, Shortness Of Breath and Itching  . Amoxicillin-Pot Clavulanate     REACTION: n/v  . Codeine     REACTION: n/v  . Ezetimibe-Simvastatin     REACTION: nausea  . Metformin And Related     Nausea and lip swelling  . Moxifloxacin     REACTION: pt states made her "deathly sick" -- nose bleeds  . Nabumetone     REACTION: nausea  . Nitrofurantoin     REACTION: n/v  . Prednisone     REACTION: rash  . Rosuvastatin  REACTION: pain  . Sulfonamide Derivatives     REACTION: nausea    Family History  Problem Relation Age of Onset  . Coronary artery disease Father 62    CABG  . Stroke Father 49  . Diabetes Mother   . Dementia Mother     mother also had- PAD, psychosis  . Hypertension Mother   . Cancer Neg Hx     colon, breast  . Multiple myeloma Father 70    BP 110/72  Pulse 70  Temp(Src) 97.2 F (36.2 C) (Oral)  Ht 5' 6.5" (1.689 m)  Wt 188 lb (85.276 kg)  BMI 29.89 kg/m2  SpO2 97%    Review of Systems denies hypoglycemia.    Objective:   Physical Exam VITAL SIGNS:  See vs page GENERAL: no distress Pulses: dorsalis pedis intact bilat.   Feet: no deformity.  no ulcer on the feet.  feet are of normal color and temp.  no edema.  There is bilateral onychomycosis.   Neuro: sensation is intact to touch on the feet.        Assessment & Plan:  DM, apparently well-controlled

## 2012-01-29 NOTE — Patient Instructions (Addendum)
check your blood sugar once a day.  vary the time of day when you check, between before the 3 meals, and at bedtime.  also check if you have symptoms of your blood sugar being too high or too low.  please keep a record of the readings and bring it to your next appointment here.  please call us sooner if your blood sugar goes below 70, or if it stays over 200. blood tests are being requested for you today.  You will receive a letter with results. Please come back for a follow-up appointment in 6 months.

## 2012-01-29 NOTE — Telephone Encounter (Signed)
Refill done.  

## 2012-01-30 ENCOUNTER — Telehealth: Payer: Self-pay | Admitting: *Deleted

## 2012-01-30 NOTE — Telephone Encounter (Signed)
Called pt to inform of lab results, left message for pt to callback office (letter also mailed to pt). 

## 2012-02-02 NOTE — Telephone Encounter (Signed)
Pt informed of lab results. Pt received letter.

## 2012-02-23 ENCOUNTER — Other Ambulatory Visit: Payer: Self-pay | Admitting: Internal Medicine

## 2012-02-23 NOTE — Telephone Encounter (Signed)
Refill done.  

## 2012-04-05 ENCOUNTER — Encounter: Payer: 59 | Admitting: Internal Medicine

## 2012-05-12 ENCOUNTER — Encounter: Payer: Self-pay | Admitting: Internal Medicine

## 2012-05-12 ENCOUNTER — Ambulatory Visit (INDEPENDENT_AMBULATORY_CARE_PROVIDER_SITE_OTHER): Payer: 59 | Admitting: Internal Medicine

## 2012-05-12 VITALS — BP 130/64 | HR 65 | Temp 98.0°F | Ht 65.5 in | Wt 185.0 lb

## 2012-05-12 DIAGNOSIS — J309 Allergic rhinitis, unspecified: Secondary | ICD-10-CM

## 2012-05-12 DIAGNOSIS — Z2911 Encounter for prophylactic immunotherapy for respiratory syncytial virus (RSV): Secondary | ICD-10-CM

## 2012-05-12 DIAGNOSIS — E785 Hyperlipidemia, unspecified: Secondary | ICD-10-CM

## 2012-05-12 DIAGNOSIS — Z0000 Encounter for general adult medical examination without abnormal findings: Secondary | ICD-10-CM

## 2012-05-12 DIAGNOSIS — E559 Vitamin D deficiency, unspecified: Secondary | ICD-10-CM

## 2012-05-12 DIAGNOSIS — M1990 Unspecified osteoarthritis, unspecified site: Secondary | ICD-10-CM

## 2012-05-12 DIAGNOSIS — D1800 Hemangioma unspecified site: Secondary | ICD-10-CM

## 2012-05-12 DIAGNOSIS — Z23 Encounter for immunization: Secondary | ICD-10-CM

## 2012-05-12 LAB — CBC WITH DIFFERENTIAL/PLATELET
Basophils Absolute: 0 10*3/uL (ref 0.0–0.1)
Eosinophils Absolute: 0.1 10*3/uL (ref 0.0–0.7)
Hemoglobin: 15.5 g/dL — ABNORMAL HIGH (ref 12.0–15.0)
Lymphocytes Relative: 29.3 % (ref 12.0–46.0)
MCHC: 33.3 g/dL (ref 30.0–36.0)
Monocytes Relative: 5.3 % (ref 3.0–12.0)
Neutro Abs: 4.1 10*3/uL (ref 1.4–7.7)
Neutrophils Relative %: 62.8 % (ref 43.0–77.0)
Platelets: 263 10*3/uL (ref 150.0–400.0)
RDW: 13.6 % (ref 11.5–14.6)

## 2012-05-12 NOTE — Progress Notes (Signed)
  Subjective:    Patient ID: Nicole Duran, female    DOB: 13-Jun-1951, 61 y.o.   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 Long history of bilateral knee pain without swelling redness or warmness. Pain is symmetric. Allergies, couldn't afford veramist, would like to try a sample of Dymista No chest pain or shortness of breath No nausea, vomiting, diarrhea No dysuria or gross hematuria She has some tinnitus x a while and a very mild decrease in her hearing R>L  Current Outpatient Rx  Name Route Sig Dispense Refill  . ALPRAZOLAM 0.5 MG PO TABS  TAKE ONE TABLET THREE TIMES DAILY AS NEEDED FOR ANXIETY. TWO TABLETSAT BEDTIME AS DIRECTED BY DOCTOR. 100 tablet 4  . ASPIRIN 81 MG PO TABS Oral Take 81 mg by mouth daily.      . ASTEPRO 0.15 % NA SOLN  2 SPRAYS ON EACH SIDE OF THE   NOSE TWICE A DAY 30 mL 1  . AZELASTINE-FLUTICASONE 137-50 MCG/ACT NA SUSP Nasal Place into the nose. 2 puffs daily    . FEXOFENADINE HCL 180 MG PO TABS Oral Take 180 mg by mouth daily.      Marland Kitchen GLUCOSE BLOOD VI STRP Other 1 each by Other route daily. And lancets 250.00 100 each 5  . IBUPROFEN 200 MG PO TABS Oral Take 200 mg by mouth every 6 (six) hours as needed.      Marland Kitchen JANUVIA 100 MG PO TABS  TAKE ONE TABLET BY MOUTH DAILY 30 each 5  . ACCU-CHEK MULTICLIX LANCET DEV KIT Does not apply  1 Device by Does not apply route once. 1 each 0  . PRAVASTATIN SODIUM 40 MG PO TABS  TAKE 1 TABLET (40 MG TOTAL) BY MOUTH EVERY EVENING. 30 tablet 1       Objective:   Physical Exam General -- alert, well-developed Neck --no thyromegaly , normal carotid puls  Lungs -- normal respiratory effort, no intercostal retractions, no accessory muscle use, and normal breath sounds.   Heart-- normal rate, regular rhythm, no murmur, and no gallop.   Abdomen--soft, non-tender, no distention, no masses, no HSM, no guarding, and no rigidity.   Extremities-- no pretibial edema bilaterally Neurologic-- alert & oriented X3 and strength normal in all extremities. Psych-- Cognition and judgment appear intact. Alert and cooperative with normal attention span and concentration.  not anxious appearing and not depressed appearing.      Assessment & Plan:

## 2012-05-12 NOTE — Assessment & Plan Note (Signed)
Allergies, couldn't afford veramist, would like to try a sample of Dymista (provided)

## 2012-05-12 NOTE — Patient Instructions (Addendum)
Hold astepro, use dymista, call if you need a prescription

## 2012-05-12 NOTE — Assessment & Plan Note (Signed)
labs

## 2012-05-12 NOTE — Assessment & Plan Note (Signed)
Removed from the R ring finger

## 2012-05-12 NOTE — Assessment & Plan Note (Addendum)
Td 05 Shingles shot  Today never had a Cscope, was referred to GI but couldn't go, did  not have a driver ; will call when ready Gyn -- used to see Dr Truett Mainland , recommend to make an appointment with another provider in the practice; they f/u her  PAP, MMG, DEXAs Discussed  diet and exercise Labs  Mild tinnitus, rec ENT, pt states will call ENT herself

## 2012-05-12 NOTE — Assessment & Plan Note (Signed)
Symmetric knee pain, likely DJD. For now recommend to use ice after physical activity, Advil as needed, may like to use a knee sleeve.

## 2012-05-13 LAB — COMPREHENSIVE METABOLIC PANEL
ALT: 15 U/L (ref 0–35)
AST: 20 U/L (ref 0–37)
Albumin: 4 g/dL (ref 3.5–5.2)
Alkaline Phosphatase: 83 U/L (ref 39–117)
Calcium: 9.7 mg/dL (ref 8.4–10.5)
Chloride: 106 mEq/L (ref 96–112)
Potassium: 4.5 mEq/L (ref 3.5–5.1)
Sodium: 141 mEq/L (ref 135–145)
Total Protein: 7.3 g/dL (ref 6.0–8.3)

## 2012-05-13 LAB — LIPID PANEL: Total CHOL/HDL Ratio: 6

## 2012-05-13 LAB — VITAMIN D 25 HYDROXY (VIT D DEFICIENCY, FRACTURES): Vit D, 25-Hydroxy: 31 ng/mL (ref 30–89)

## 2012-05-14 LAB — LDL CHOLESTEROL, DIRECT: Direct LDL: 199.9 mg/dL

## 2012-05-17 MED ORDER — PRAVASTATIN SODIUM 40 MG PO TABS: 80.0000 mg | ORAL_TABLET | Freq: Every day | ORAL | Status: DC

## 2012-05-17 NOTE — Addendum Note (Signed)
Addended by: Edwena Felty T on: 05/17/2012 02:23 PM   Modules accepted: Orders

## 2012-05-17 NOTE — Addendum Note (Signed)
Addended by: Edwena Felty T on: 05/17/2012 02:24 PM   Modules accepted: Orders

## 2012-05-28 ENCOUNTER — Telehealth: Payer: Self-pay | Admitting: Internal Medicine

## 2012-05-28 ENCOUNTER — Other Ambulatory Visit: Payer: Self-pay | Admitting: Internal Medicine

## 2012-05-28 MED ORDER — ALPRAZOLAM 0.5 MG PO TABS: ORAL_TABLET | ORAL | Status: DC

## 2012-05-28 NOTE — Telephone Encounter (Signed)
Done

## 2012-05-28 NOTE — Telephone Encounter (Signed)
Refill: Alprazolam 0.5mg  tab. Last fill 7.27.13

## 2012-05-28 NOTE — Telephone Encounter (Signed)
Refill done.  

## 2012-05-28 NOTE — Telephone Encounter (Signed)
Ok to refill 

## 2012-07-15 LAB — HM DIABETES EYE EXAM

## 2012-07-22 ENCOUNTER — Encounter: Payer: Self-pay | Admitting: *Deleted

## 2012-08-12 ENCOUNTER — Other Ambulatory Visit (INDEPENDENT_AMBULATORY_CARE_PROVIDER_SITE_OTHER): Payer: 59

## 2012-08-12 ENCOUNTER — Other Ambulatory Visit: Payer: Self-pay | Admitting: Internal Medicine

## 2012-08-12 DIAGNOSIS — E785 Hyperlipidemia, unspecified: Secondary | ICD-10-CM

## 2012-08-12 LAB — ALT: ALT: 21 U/L (ref 0–35)

## 2012-08-12 LAB — LIPID PANEL
HDL: 44.7 mg/dL (ref >39.00)
Total CHOL/HDL Ratio: 4
VLDL: 36.2 mg/dL (ref 0.0–40.0)

## 2012-08-12 LAB — AST: AST: 24 U/L (ref 0–37)

## 2012-08-17 ENCOUNTER — Telehealth: Payer: Self-pay | Admitting: *Deleted

## 2012-08-17 MED ORDER — EZETIMIBE 10 MG PO TABS: 10.0000 mg | ORAL_TABLET | Freq: Every day | ORAL | Status: DC

## 2012-08-17 NOTE — Telephone Encounter (Signed)
Refill done.  

## 2012-08-17 NOTE — Telephone Encounter (Signed)
Message copied by Nada Maclachlan on Tue Aug 17, 2012  5:41 PM ------      Message from: Willow Ora E      Created: Tue Aug 17, 2012  4:57 PM       Call in a prescription for Zetia 10 mg one by mouth daily #30 and 6 refills. She may call for samples, that would be okay.

## 2012-08-21 ENCOUNTER — Other Ambulatory Visit: Payer: Self-pay | Admitting: Internal Medicine

## 2012-08-23 NOTE — Telephone Encounter (Signed)
Refill done.  

## 2012-09-03 ENCOUNTER — Telehealth: Payer: Self-pay | Admitting: *Deleted

## 2012-09-03 NOTE — Telephone Encounter (Signed)
Discuss with patient, samples placed up front for pickup.  Notes Recorded by Wanda Plump, MD on 08/17/2012 at 4:57 PM Releasing results to mychart with comments Allie, Your cholesterol has definitely improved, he decreased from 275 to 201. The bad cholesterol or LDL has also decreased from 199 to 147. The LDL should be 100 or less, I recommend to start a medication called Zetia 10 mg one tablet a day. In the past you took a Vytorin which contains zetia and you have some nausea but I think zetia by itself shouldn't be a problem. We are going to send a prescription for Zetia to the pharmacy. If you're interested in samples, call my office and talk to . Mardella Layman, my nurse. Also schedule a routine checkup in 3 months for now

## 2012-10-07 ENCOUNTER — Other Ambulatory Visit: Payer: Self-pay | Admitting: *Deleted

## 2012-10-07 MED ORDER — GLUCOSE BLOOD VI STRP: 1.0000 | ORAL_STRIP | Freq: Every day | Status: DC

## 2012-10-21 ENCOUNTER — Other Ambulatory Visit: Payer: Self-pay | Admitting: Internal Medicine

## 2012-10-21 NOTE — Telephone Encounter (Signed)
Refill done.  

## 2012-10-21 NOTE — Telephone Encounter (Signed)
Yes, she needs both pravachol ans zetia

## 2012-10-21 NOTE — Telephone Encounter (Signed)
Please clarify if pt is still suppose to be taking pravastatin.

## 2013-01-04 ENCOUNTER — Ambulatory Visit: Payer: 59 | Admitting: Endocrinology

## 2013-01-18 ENCOUNTER — Ambulatory Visit (INDEPENDENT_AMBULATORY_CARE_PROVIDER_SITE_OTHER): Payer: 59 | Admitting: Endocrinology

## 2013-01-18 ENCOUNTER — Encounter: Payer: Self-pay | Admitting: Endocrinology

## 2013-01-18 VITALS — BP 126/80 | HR 78 | Ht 66.0 in | Wt 193.0 lb

## 2013-01-18 DIAGNOSIS — E119 Type 2 diabetes mellitus without complications: Secondary | ICD-10-CM

## 2013-01-18 LAB — MICROALBUMIN / CREATININE URINE RATIO
Creatinine,U: 93.1 mg/dL
Microalb Creat Ratio: 1.6 mg/g (ref 0.0–30.0)

## 2013-01-18 LAB — HEMOGLOBIN A1C: Hgb A1c MFr Bld: 7.1 % — ABNORMAL HIGH (ref 4.6–6.5)

## 2013-01-18 NOTE — Patient Instructions (Addendum)
check your blood sugar once a day.  vary the time of day when you check, between before the 3 meals, and at bedtime.  also check if you have symptoms of your blood sugar being too high or too low.  please keep a record of the readings and bring it to your next appointment here.  please call us sooner if your blood sugar goes below 70, or if you have a lot of readings over 200. blood tests are being requested for you today.  We'll contact you with results. Please come back for a follow-up appointment in 6 months.

## 2013-01-18 NOTE — Progress Notes (Signed)
Subjective:    Patient ID: Nicole Duran, female    DOB: [birth year], 62 y.o.   MRN: [id number]  HPI Pt returns for f/u of type 2 DM (dx'ed 2011; she has mild if any neuropathy of the lower extremities; no other associated complications).  no cbg record, but states cbg's are well-controlled.  pt states she feels well in general.   Past Medical History  Diagnosis Date  . Kidney stone   . Metabolic syndrome   . Menopause   . Insomnia   . ALLERGIC RHINITIS 04/22/2007  . HYPERLIPIDEMIA 04/22/2007  . 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 Anxiety     Past Surgical History  Procedure Laterality Date  . Cholecystectomy      History   Social History  . Marital Status: Widowed    Spouse Name: N/A    Number of Children: 1  . Years of Education: N/A   Occupational History  .     Social History Main Topics  . Smoking status: Never Smoker   . Smokeless tobacco: Not on file  . Alcohol Use: 0.0 oz/week     Comment: socially  . Drug Use: No  . Sexually Active: Not on file   Other Topics Concern  . Not on file   Social History Narrative   Lives alone....   Exercise and exercise --improved after she went to a nutritionist.....   Works 2 jobs....    Current Outpatient Prescriptions on File Prior to Visit  Medication Sig Dispense Refill  . ALPRAZolam (XANAX) 0.5 MG tablet One by mouth 3 times a day as needed for anxiety, 2 at bedtime as needed for insomnia  100 tablet  5  . aspirin 81 MG tablet Take 81 mg by mouth daily.        . ASTEPRO 0.15 % SOLN 2 SPRAYS ON EACH SIDE OF THE   NOSE TWICE A DAY  30 mL  1  . ASTEPRO 0.15 % SOLN 2 SPRAYS ON EACH SIDE OF THE    NOSE TWICE A DAY  30 mL  2  . Azelastine-Fluticasone (DYMISTA) 137-50 MCG/ACT SUSP Place into the nose. 2 puffs daily      . ezetimibe (ZETIA) 10 MG tablet Take 1 tablet (10 mg total) by mouth daily.  90 tablet  1  . fexofenadine (ALLEGRA) 180 MG tablet Take 180 mg by mouth daily.        Marland Kitchen glucose blood (ACCU-CHEK AVIVA) test strip 1 each by  Other route daily. And lancets 250.00  Use as directed by doctor to check blood sugar. NOTICE; PATIENT NEEDS TO MAKE FOLLOW UP APPT. WITH DR. Everardo All FOR FURTHER REFILLS.  100 each  0  . ibuprofen (ADVIL,MOTRIN) 200 MG tablet Take 200 mg by mouth every 6 (six) hours as needed.        Marland Kitchen JANUVIA 100 MG tablet TAKE ONE TABLET BY MOUTH DAILY  30 tablet  6  . Lancets Misc. (ACCU-CHEK MULTICLIX LANCET DEV) KIT 1 Device by Does not apply route once.  1 each  0  . pravastatin (PRAVACHOL) 40 MG tablet TAKE 1 TABLET (40 MG TOTAL) BY MOUTH EVERY EVENING.  30 tablet  1  . pravastatin (PRAVACHOL) 40 MG tablet TAKE TWO TABLETS BY MOUTH DAILY AT BEDTIME  60 tablet  6   No current facility-administered medications on file prior to visit.    Allergies  Allergen Reactions  . Cefuroxime Axetil Hives, Shortness Of Breath and Itching  . Amoxicillin-Pot Clavulanate  REACTION: n/v  . Codeine     REACTION: n/v  . Ezetimibe-Simvastatin     REACTION: nausea  . Metformin And Related     Nausea and lip swelling  . Moxifloxacin     REACTION: pt states made her "deathly sick" -- nose bleeds  . Nabumetone     REACTION: nausea  . Nitrofurantoin     REACTION: n/v  . Prednisone     REACTION: rash  . Rosuvastatin     REACTION: pain  . Sulfonamide Derivatives     REACTION: nausea    Family History  Problem Relation Age of Onset  . Coronary artery disease Father 20    CABG  . Stroke Father 62  . Diabetes Mother   . Dementia Mother     mother also had- PAD, psychosis  . Hypertension Mother   . Cancer Neg Hx     colon, breast  . Multiple myeloma Father 70    BP 126/80  Pulse 78  Ht 5\' 6"  (1.676 m)  Wt 193 lb (87.544 kg)  BMI 31.17 kg/m2  SpO2 98%    Review of Systems denies hypoglycemia and weight change.    Objective:   Physical Exam VITAL SIGNS:  See vs page GENERAL: no distress      Assessment & Plan:  DM: apparently well-controlled.  Metformin gives the best relationship between  risk and benefit, but she did not tolerate it.

## 2013-01-20 ENCOUNTER — Telehealth: Payer: Self-pay | Admitting: Endocrinology

## 2013-01-20 NOTE — Telephone Encounter (Signed)
Awaiting blood test results. Please call at work, [phone] / Oneita Kras.

## 2013-01-20 NOTE — Telephone Encounter (Signed)
Your blood sugar is a little high. You should add another medication. We could add a different medication, but you should first consider restarting the metformin at a low dosage. Please let me know.

## 2013-01-20 NOTE — Telephone Encounter (Signed)
Pt states she is going to work on diet and exercise first and stay on Januvia and f/u with you at next visit

## 2013-01-25 ENCOUNTER — Telehealth: Payer: Self-pay | Admitting: *Deleted

## 2013-01-25 ENCOUNTER — Encounter: Payer: Self-pay | Admitting: *Deleted

## 2013-01-25 MED ORDER — ALPRAZOLAM 0.5 MG PO TABS: ORAL_TABLET | ORAL | Status: DC

## 2013-01-25 NOTE — Telephone Encounter (Signed)
Last OV 05-12-12 , last filled 05-28-12 #100 5

## 2013-01-25 NOTE — Telephone Encounter (Signed)
Pt made aware rx & controlled substance contract is ready to be picked up at front desk.  

## 2013-01-25 NOTE — Telephone Encounter (Addendum)
ok #100 and 3 refills but needs to sign a controlled substance contract

## 2013-01-30 LAB — HM PAP SMEAR: HM Pap smear: NORMAL

## 2013-02-21 ENCOUNTER — Other Ambulatory Visit: Payer: Self-pay | Admitting: Family Medicine

## 2013-02-21 ENCOUNTER — Encounter: Payer: Self-pay | Admitting: Internal Medicine

## 2013-02-21 NOTE — Telephone Encounter (Signed)
Refill done.  

## 2013-03-20 ENCOUNTER — Other Ambulatory Visit: Payer: Self-pay | Admitting: Internal Medicine

## 2013-03-31 ENCOUNTER — Ambulatory Visit (INDEPENDENT_AMBULATORY_CARE_PROVIDER_SITE_OTHER): Payer: 59 | Admitting: Internal Medicine

## 2013-03-31 ENCOUNTER — Encounter: Payer: Self-pay | Admitting: Internal Medicine

## 2013-03-31 VITALS — BP 140/65 | HR 63 | Temp 98.0°F | Wt 195.8 lb

## 2013-03-31 DIAGNOSIS — R21 Rash and other nonspecific skin eruption: Secondary | ICD-10-CM

## 2013-03-31 MED ORDER — BETAMETHASONE DIPROPIONATE AUG 0.05 % EX LOTN: TOPICAL_LOTION | Freq: Two times a day (BID) | CUTANEOUS | Status: DC

## 2013-03-31 NOTE — Progress Notes (Signed)
  Subjective:    Patient ID: Nicole Duran, female    DOB: [birth year], 62 y.o.   MRN: [id number]  HPI Acute visit 9 days ago while working at her yard had several wasp stings, the areas (Neck, extremities, right back) got  immediately swollen and itchy; In addition to her regular Zyrtec, she took a Benadryl and  has been doing that on and off since then and in general the areas on looking better. 3 days ago however she developed a new rash in the left lower quadrant of the abdomen, she does not recall any direct wasp stings there. The area is itching, not hurting.   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 Denies any shortness or breath or lip swelling. No arthralgias, nausea vomiting.     Objective:   Physical Exam  Skin:      BP 140/65  Pulse 63  Temp(Src) 98 F (36.7 C) (Oral)  Wt 195 lb 12.8 oz (88.814 kg)  BMI 31.62 kg/m2  SpO2 97%  General -- alert, well-developed, NAD.    HEENT -- lips normal  Extremities-- no pretibial edema bilaterally Skin-- Neck, he never right arm, right back: Area of a wasp sting with minimal redness or swelling.  Neurologic-- alert & oriented X3 and strength normal in all extremities. Psych-- Cognition and judgment appear intact. Alert and cooperative with normal attention span and concentration.  not anxious appearing and not depressed appearing.        Assessment &  Plan:   Wasps stings--improving Rash in the left lower quadrant--unclear etiology, she does not recall a direct sting in that area, rash does not look like an allergic reaction or  shingles. Plan: Continue with Zyrtec, local corticosteroids, patient will call if not better.

## 2013-03-31 NOTE — Patient Instructions (Addendum)
Continue with Allegra or Zyrtec daily Use the lotion I sent to the pharmacy twice a day Also may like to use Caladryl OTC Call if not improving in the next few days

## 2013-04-23 ENCOUNTER — Other Ambulatory Visit: Payer: Self-pay | Admitting: Internal Medicine

## 2013-04-25 NOTE — Telephone Encounter (Signed)
Rx was refilled for Januvia 100 mg.  Ag cma

## 2013-06-10 ENCOUNTER — Ambulatory Visit: Payer: 59

## 2013-07-07 ENCOUNTER — Other Ambulatory Visit: Payer: Self-pay

## 2013-08-12 ENCOUNTER — Ambulatory Visit (INDEPENDENT_AMBULATORY_CARE_PROVIDER_SITE_OTHER): Payer: 59 | Admitting: Internal Medicine

## 2013-08-12 ENCOUNTER — Encounter: Payer: Self-pay | Admitting: Internal Medicine

## 2013-08-12 ENCOUNTER — Telehealth: Payer: Self-pay | Admitting: *Deleted

## 2013-08-12 ENCOUNTER — Other Ambulatory Visit: Payer: Self-pay | Admitting: Internal Medicine

## 2013-08-12 VITALS — BP 147/79 | HR 70 | Temp 98.2°F | Wt 195.0 lb

## 2013-08-12 DIAGNOSIS — R51 Headache: Secondary | ICD-10-CM

## 2013-08-12 MED ORDER — DOXYCYCLINE HYCLATE 100 MG PO TABS: 100.0000 mg | ORAL_TABLET | Freq: Two times a day (BID) | ORAL | Status: DC

## 2013-08-12 MED ORDER — ALPRAZOLAM 0.5 MG PO TABS: ORAL_TABLET | ORAL | Status: DC

## 2013-08-12 NOTE — Progress Notes (Signed)
   Subjective:    Patient ID: Nicole Duran, female    DOB: [birth year], 62 y.o.   MRN: [id number]  HPI Acute visit, Patient reports she developed a URI a week ago, she is here because in the last few days she has developed left facial pain and left ear ache. She is having some greenish nasal discharge. She's taking Mucinex and Allegra.  Past Medical History  Diagnosis Date  . Kidney stone   . Metabolic syndrome   . Menopause   . Insomnia   . ALLERGIC RHINITIS 04/22/2007  . HYPERLIPIDEMIA 04/22/2007  . 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 Anxiety    Past Surgical History  Procedure Laterality Date  . Cholecystectomy     History   Social History  . Marital Status: Widowed    Spouse Name: N/A    Number of Children: 1  . Years of Education: N/A   Occupational History  . 2 jobs    Social History Main Topics  . Smoking status: Former Games developer  . Smokeless tobacco: Never Used     Comment: never heavy use , quit in the 80s  . Alcohol Use: 0.0 oz/week     Comment: socially  . Drug Use: No  . Sexual Activity: Not on file   Other Topics Concern  . 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 No  rash at  the face. Mild sore throat, no ear discharge. Very mild chest congestion and cough. She is diabetic, her blood sugars are slightly elevated daily she thinks related to diet, CBGs are not more than 200    Objective:   Physical Exam BP 147/79  Pulse 70  Temp(Src) 98.2 F (36.8 C)  Wt 195 lb (88.451 kg)  SpO2 96% General -- alert, well-developed, NAD.  HEENT-- Not pale. TMs normal, throat symmetric, no redness or discharge. Face symmetric, sinuses + tender to palpation L>>R. Nose   Congested. EOMI-PERLA No facial rash. Preseptal area normal to inspection and palpation  Lungs -- normal respiratory effort, no intercostal retractions, no accessory muscle use, and normal breath sounds.  Heart-- normal rate, regular rhythm, no murmur.   Neurologic--  alert & oriented X3. Speech normal, gait normal, strength normal in all extremities.  DTRs symmetric   Psych-- Cognition and judgment appear intact. Cooperative with normal attention span and concentration. No anxious appearing , no depressed appearing.      Assessment & Plan:  Facial pain, Left facial pain in the context of recent URI, likely acute sinusitis. Patient has diabetes, and at this point no evidence of serious complications. Plan:  She is allergic to multiple antibiotics, will treat with doxycycline, she will call if not improving soon. See instructions.

## 2013-08-12 NOTE — Telephone Encounter (Signed)
rx refill- Xanax 0.5mg  Last OV- 08/12/13 Last refilled- 01/05/13 #100 / 3 rf  UDS low- 01/26/13

## 2013-08-12 NOTE — Addendum Note (Signed)
Addended by: Willow Ora E on: 08/12/2013 02:52 PM   Modules accepted: Orders

## 2013-08-12 NOTE — Patient Instructions (Signed)
Rest, fluids. For pain take Tylenol  500 mg OTC 2 tabs a day every 8 hours as needed or advil If cough, take Mucinex DM twice a day as needed  For congestion use OTC Nasocort: 2 nasal sprays on each side of the nose daily until you feel better Continue your other nasal spray Take the antibiotic as prescribed  (doxy) x 10 days Call if no better in few days Call anytime if the symptoms are severe, you developed a rash, facial swelling or redness, high fever

## 2013-08-12 NOTE — Progress Notes (Signed)
Pre visit review using our clinic review tool, if applicable. No additional management support is needed unless otherwise documented below in the visit note. 

## 2013-08-12 NOTE — Telephone Encounter (Signed)
Done

## 2013-08-13 ENCOUNTER — Encounter: Payer: Self-pay | Admitting: Internal Medicine

## 2013-08-19 ENCOUNTER — Telehealth: Payer: Self-pay

## 2013-08-19 NOTE — Telephone Encounter (Addendum)
Left message for call back Non identifiable  

## 2013-08-22 ENCOUNTER — Ambulatory Visit (INDEPENDENT_AMBULATORY_CARE_PROVIDER_SITE_OTHER): Payer: 59 | Admitting: Internal Medicine

## 2013-08-22 ENCOUNTER — Encounter: Payer: Self-pay | Admitting: Internal Medicine

## 2013-08-22 VITALS — BP 149/80 | HR 78 | Temp 98.0°F | Ht 66.0 in | Wt 193.0 lb

## 2013-08-22 DIAGNOSIS — R011 Cardiac murmur, unspecified: Secondary | ICD-10-CM

## 2013-08-22 DIAGNOSIS — Z23 Encounter for immunization: Secondary | ICD-10-CM

## 2013-08-22 DIAGNOSIS — E119 Type 2 diabetes mellitus without complications: Secondary | ICD-10-CM

## 2013-08-22 DIAGNOSIS — Z0000 Encounter for general adult medical examination without abnormal findings: Secondary | ICD-10-CM

## 2013-08-22 DIAGNOSIS — E785 Hyperlipidemia, unspecified: Secondary | ICD-10-CM

## 2013-08-22 DIAGNOSIS — E559 Vitamin D deficiency, unspecified: Secondary | ICD-10-CM

## 2013-08-22 LAB — CBC WITH DIFFERENTIAL/PLATELET
Basophils Relative: 0.5 % (ref 0.0–3.0)
Eosinophils Relative: 0.8 % (ref 0.0–5.0)
HCT: 44.7 % (ref 36.0–46.0)
Hemoglobin: 15.1 g/dL — ABNORMAL HIGH (ref 12.0–15.0)
Lymphs Abs: 2.8 10*3/uL (ref 0.7–4.0)
Monocytes Absolute: 0.4 10*3/uL (ref 0.1–1.0)
Monocytes Relative: 4.7 % (ref 3.0–12.0)
Neutro Abs: 5.6 10*3/uL (ref 1.4–7.7)
RBC: 4.78 Mil/uL (ref 3.87–5.11)
WBC: 8.9 10*3/uL (ref 4.5–10.5)

## 2013-08-22 LAB — HEMOGLOBIN A1C: Hgb A1c MFr Bld: 7.8 % — ABNORMAL HIGH (ref 4.6–6.5)

## 2013-08-22 LAB — LIPID PANEL
Total CHOL/HDL Ratio: 5
VLDL: 38.8 mg/dL (ref 0.0–40.0)

## 2013-08-22 LAB — COMPREHENSIVE METABOLIC PANEL
BUN: 16 mg/dL (ref 6–23)
CO2: 28 mEq/L (ref 19–32)
Creatinine, Ser: 0.8 mg/dL (ref 0.4–1.2)
GFR: 75.49 mL/min (ref >60.00)
Glucose, Bld: 140 mg/dL — ABNORMAL HIGH (ref 70–99)
Potassium: 3.9 mEq/L (ref 3.5–5.1)
Total Bilirubin: 0.4 mg/dL (ref 0.3–1.2)

## 2013-08-22 LAB — MICROALBUMIN / CREATININE URINE RATIO: Microalb, Ur: 1.5 mg/dL (ref 0.0–1.9)

## 2013-08-22 NOTE — Progress Notes (Signed)
Pre visit review using our clinic review tool, if applicable. No additional management support is needed unless otherwise documented below in the visit note. 

## 2013-08-22 NOTE — Patient Instructions (Signed)
Get your blood work before you leave  Next visit for a  follow up   regards diabetes   , no fasting, in 4 months  Please make an appointment

## 2013-08-22 NOTE — Progress Notes (Signed)
   Subjective:    Patient ID: Nicole Duran, female    DOB: [birth year], 62 y.o.   MRN: [id number]  HPI CPX Nodule at L foot x few weeks, occ tender, see physical exam  Past Medical History  Diagnosis Date  . Kidney stone   . Metabolic syndrome   . Menopause   . Insomnia   . ALLERGIC RHINITIS 04/22/2007  . HYPERLIPIDEMIA 04/22/2007  . 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 Anxiety    Past Surgical History  Procedure Laterality Date  . Cholecystectomy     History   Social History  . Marital Status: Widowed    Spouse Name: N/A    Number of Children: 1  . Years of Education: N/A   Occupational History  . 2 jobs    Social History Main Topics  . Smoking status: Former Games developer  . Smokeless tobacco: Never Used     Comment: never heavy use , quit in the 80s  . Alcohol Use: 0.0 oz/week     Comment: socially  . Drug Use: No  . Sexual Activity: Not on file   Other Topics Concern  . Not on file   Social History Narrative   Lives by herself, widow (1990), 1 daughter     Lost mother 25            Family History  Problem Relation Age of Onset  . Coronary artery disease Father 67    CABG  . Stroke Father 60  . Diabetes Mother   . Dementia Mother     mother also had- PAD, psychosis  . Hypertension Mother   . Cancer Neg Hx     colon, breast  . Multiple myeloma Father 13     Review of Systems Diet-- healthy most of the time  Exercise-- 3-4 x week, yard work No  CP, SOB, lower extremity edema denies  nausea, vomiting diarrhea Denies  blood in the stools  No dysuria, gross hematuria, difficulty urinating   Anxiety-- sx well controlled         Objective:   Physical Exam  Musculoskeletal:       Feet:   General -- alert, well-developed, NAD.  Neck --no thyromegaly , normal carotid pulse  HEENT-- Not pale.   Lungs -- normal respiratory effort, no intercostal retractions, no accessory muscle use, and normal breath sounds.  Heart-- normal rate, regular rhythm, ? Murmur at the  R sternal border   Abdomen-- Not distended, good bowel sounds,soft, non-tender. Extremities-- no pretibial edema bilaterally  Neurologic--  alert & oriented X3.   Psych-- Cognition and judgment appear intact. Cooperative with normal attention span and concentration. No anxious appearing , no depressed appearing.      Assessment & Plan:  Ca+ tendinitis L foot, We agreed she will see her orthopedic MD

## 2013-08-22 NOTE — Assessment & Plan Note (Addendum)
Td 05 Shingles shot 2005 PNM shot today Had a flu shot  never had a Cscope but very interested, declined referral, gave her [facility] Gyn -- sees Dr Claiborne Billings , they are in charge of   PAP, MMG, DEXAs Discussed  diet and exercise Labs   BP today slightly elevated but usually okay, not changed. Systolic murmur?, Schedule an echo

## 2013-08-22 NOTE — Addendum Note (Signed)
Addended by: Wende Mott on: 08/22/2013 08:58 AM   Modules accepted: Orders, Medications

## 2013-08-22 NOTE — Assessment & Plan Note (Signed)
Reports good compliance with Januvia, labs

## 2013-08-22 NOTE — Telephone Encounter (Addendum)
Medication and allergies:  Reviewed and updated  90 day supply/mail order: na Local pharmacy: [facility]   Immunizations due:  UTD  A/P:   No changes to FH or PSH or personal hx Pap--2014--nml MMG--2014--neg never had a Cscope, was referred to GI but couldn't go, did not have a driver ; will call when ready  Gyn -- used to see Dr Truett Mainland , recommend to make an appointment with another provider in the practice; they f/u her PAP, MMG, DEXA's   To Discuss with Provider: Nodule on left foot

## 2013-08-23 ENCOUNTER — Telehealth: Payer: Self-pay | Admitting: Internal Medicine

## 2013-08-23 LAB — VITAMIN D 25 HYDROXY (VIT D DEFICIENCY, FRACTURES): Vit D, 25-Hydroxy: 28 ng/mL — ABNORMAL LOW (ref 30–89)

## 2013-08-23 MED ORDER — EZETIMIBE 10 MG PO TABS: 10.0000 mg | ORAL_TABLET | Freq: Every day | ORAL | Status: DC

## 2013-08-23 MED ORDER — ERGOCALCIFEROL 1.25 MG (50000 UT) PO CAPS: 50000.0000 [IU] | ORAL_CAPSULE | ORAL | Status: DC

## 2013-08-23 NOTE — Assessment & Plan Note (Signed)
She is actually taking Pravachol 40 mg 2 tablets daily, plan: Labs

## 2013-08-23 NOTE — Telephone Encounter (Addendum)
Spoke with the patient about results Diabetes not well-controlled, recommend to see endocrinology as soon as possible High cholesterol not well-controlled, on Pravachol 40, actually taking 2 tablets daily. Plan: Add zetia, will check her cholesterol when she comes back Low vitamin D, recommend vitamin D 1000 units daily and ergocalciferol 50,000 weekly for 3 months.

## 2013-09-13 ENCOUNTER — Telehealth: Payer: Self-pay

## 2013-09-13 ENCOUNTER — Other Ambulatory Visit (HOSPITAL_COMMUNITY): Payer: 59

## 2013-09-13 NOTE — Telephone Encounter (Signed)
UDS--08/23/2013 Negative Low risk--per Dr Larose Kells

## 2013-09-14 ENCOUNTER — Ambulatory Visit: Payer: 59 | Admitting: Endocrinology

## 2013-09-21 ENCOUNTER — Ambulatory Visit (INDEPENDENT_AMBULATORY_CARE_PROVIDER_SITE_OTHER): Payer: 59 | Admitting: Endocrinology

## 2013-09-21 ENCOUNTER — Encounter: Payer: Self-pay | Admitting: Endocrinology

## 2013-09-21 ENCOUNTER — Other Ambulatory Visit: Payer: Self-pay

## 2013-09-21 VITALS — BP 116/82 | HR 72 | Temp 97.7°F | Ht 66.0 in | Wt 193.0 lb

## 2013-09-21 DIAGNOSIS — E119 Type 2 diabetes mellitus without complications: Secondary | ICD-10-CM

## 2013-09-21 LAB — HEMOGLOBIN A1C: HEMOGLOBIN A1C: 7.9 % — AB (ref 4.6–6.5)

## 2013-09-21 MED ORDER — GLUCOSE BLOOD VI STRP: 1.0000 | ORAL_STRIP | Freq: Every day | Status: DC

## 2013-09-21 MED ORDER — METFORMIN HCL ER 500 MG PO TB24: 500.0000 mg | ORAL_TABLET | Freq: Every day | ORAL | Status: DC

## 2013-09-21 MED ORDER — GLUCOSE BLOOD VI STRP: ORAL_STRIP | Status: DC

## 2013-09-21 MED ORDER — ONETOUCH ULTRA 2 W/DEVICE KIT: PACK | Status: DC

## 2013-09-21 NOTE — Patient Instructions (Addendum)
check your blood sugar once a day.  vary the time of day when you check, between before the 3 meals, and at bedtime.  also check if you have symptoms of your blood sugar being too high or too low.  please keep a record of the readings and bring it to your next appointment here.  please call us sooner if your blood sugar goes below 70, or if you have a lot of readings over 200. blood tests are being requested for you today.  We'll contact you with results. If it is high, let's try a small amount of metformin.   Please come back for a follow-up appointment in 6 months.

## 2013-09-21 NOTE — Progress Notes (Signed)
Subjective:    Patient ID: Nicole Duran, female    DOB: September 23, 1950, 63 y.o.   MRN: [id number]  HPI Pt returns for f/u of type 2 DM (dx'ed 2011; she has mild if any neuropathy of the lower extremities; no other associated complications; she did not tolerate metformin; she has never been on insulin).  no cbg record, but states cbg's are well-controlled.  pt states she feels well in general, except for a headache.  Pt says she believes the dec, 2014 a1c is incorrect.   Past Medical History  Diagnosis Date  . Kidney stone   . Metabolic syndrome   . Menopause   . Insomnia   . ALLERGIC RHINITIS 04/22/2007  . HYPERLIPIDEMIA 04/22/2007  . 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 Anxiety     Past Surgical History  Procedure Laterality Date  . Cholecystectomy      History   Social History  . Marital Status: Widowed    Spouse Name: N/A    Number of Children: 1  . Years of Education: N/A   Occupational History  . 2 jobs    Social History Main Topics  . Smoking status: Former Research scientist (life sciences)  . Smokeless tobacco: Never Used     Comment: never heavy use , quit in the 80s  . Alcohol Use: 0.0 oz/week     Comment: socially  . Drug Use: No  . Sexual Activity: Not on file   Other Topics Concern  . Not on file   Social History Narrative   Lives by herself, widow (1990), 1 daughter     Lost mother 2014             Current Outpatient Prescriptions on File Prior to Visit  Medication Sig Dispense Refill  . ALPRAZolam (XANAX) 0.5 MG tablet One by mouth 3 times a day as needed for anxiety, 2 at bedtime as needed for insomnia  100 tablet  3  . aspirin 81 MG tablet Take 81 mg by mouth daily.        . betamethasone, augmented, (DIPROLENE) 0.05 % lotion Apply topically 2 (two) times daily.  60 mL  1  . DiphenhydrAMINE HCl (BENADRYL PO) Take by mouth as needed.      . ergocalciferol (VITAMIN D2) 50000 UNITS capsule Take 1 capsule (50,000 Units total) by mouth once a week.  12 capsule  0  . ezetimibe (ZETIA) 10 MG  tablet Take 1 tablet (10 mg total) by mouth daily.  30 tablet  6  . fexofenadine (ALLEGRA) 180 MG tablet Take 180 mg by mouth daily.        Marland Kitchen ibuprofen (ADVIL,MOTRIN) 200 MG tablet Take 200 mg by mouth every 6 (six) hours as needed.        Marland Kitchen JANUVIA 100 MG tablet TAKE ONE TABLET BY MOUTH DAILY  30 tablet  3  . Lancets Misc. (ACCU-CHEK MULTICLIX LANCET DEV) KIT 1 Device by Does not apply route once.  1 each  0  . pravastatin (PRAVACHOL) 40 MG tablet TAKE 2 TABLET BY MOUTH EVERY EVENING.      . Triamcinolone Acetonide (NASACORT AQ NA) Place into the nose. Daily       No current facility-administered medications on file prior to visit.    Allergies  Allergen Reactions  . Cefuroxime Axetil Hives, Shortness Of Breath and Itching    Ceftin  . Amoxicillin-Pot Clavulanate     REACTION: n/v  . Codeine     REACTION: n/v  . Ezetimibe-Simvastatin  REACTION: nausea  . Moxifloxacin     REACTION: pt states made her "deathly sick" -- nose bleeds Avelox  . Nabumetone     REACTION: nausea Relafen  . Nitrofurantoin     REACTION: n/v Macrobid  . Prednisone     REACTION: rash  . Rosuvastatin     REACTION: pain Crestor  . Sulfonamide Derivatives     REACTION: nausea    Family History  Problem Relation Age of Onset  . Coronary artery disease Father 61    CABG  . Stroke Father 45  . Diabetes Mother   . Dementia Mother     mother also had- PAD, psychosis  . Hypertension Mother   . Cancer Neg Hx     colon, breast  . Multiple myeloma Father 70    BP 116/82  Pulse 72  Temp(Src) 97.7 F (36.5 C) (Oral)  Ht _0  (1.676 m)  Wt 193 lb (87.544 kg)  BMI 31.17 kg/m2  SpO2 97%  Review of Systems denies hypoglycemia.  She has lost a few lbs.     Objective:   Physical Exam VITAL SIGNS:  See vs page GENERAL: no distress  Lab Results  Component Value Date   HGBA1C 7.9* 09/21/2013      Assessment & Plan:  DM: Needs increased rx, if it can be done with a regimen that avoids or  minimizes hypoglycemia. Weight loss: this helps DM control

## 2013-09-26 ENCOUNTER — Other Ambulatory Visit: Payer: Self-pay

## 2013-09-26 MED ORDER — ONETOUCH ULTRASOFT LANCETS MISC: Status: DC

## 2013-11-02 ENCOUNTER — Telehealth: Payer: Self-pay | Admitting: Internal Medicine

## 2013-11-02 DIAGNOSIS — E785 Hyperlipidemia, unspecified: Secondary | ICD-10-CM

## 2013-11-02 NOTE — Telephone Encounter (Signed)
Okay to discontinue Pravachol and also discontinue Zetia Restart Crestor 10 mg one by mouth daily, provide samples , also call a prescription #30 and 2 refills Watch for aches and pains from Crestor,  Schedule a visit to see me fasting in 6 weeks

## 2013-11-02 NOTE — Telephone Encounter (Signed)
Patient called and stated that she is now taking pravastatin (PRAVACHOL) 40 MG tablet but would like to switch to Crestor and give it another chance. Please advise

## 2013-11-03 MED ORDER — ROSUVASTATIN CALCIUM 10 MG PO TABS: 10.0000 mg | ORAL_TABLET | Freq: Every day | ORAL | Status: DC

## 2013-11-03 NOTE — Telephone Encounter (Signed)
rx sent. lmovm for pt to return call

## 2013-11-03 NOTE — Addendum Note (Signed)
Addended by: Peggyann Shoals on: 11/03/2013 01:13 PM   Modules accepted: Orders, Medications

## 2013-11-06 ENCOUNTER — Other Ambulatory Visit: Payer: Self-pay | Admitting: Internal Medicine

## 2013-11-06 DIAGNOSIS — E119 Type 2 diabetes mellitus without complications: Secondary | ICD-10-CM

## 2013-11-07 NOTE — Telephone Encounter (Signed)
Refill for [facility] to [facility] [address]

## 2013-11-23 ENCOUNTER — Encounter: Payer: Self-pay | Admitting: Internal Medicine

## 2013-11-29 ENCOUNTER — Ambulatory Visit (HOSPITAL_COMMUNITY): Payer: 59 | Attending: Cardiology | Admitting: Radiology

## 2013-11-29 ENCOUNTER — Encounter: Payer: Self-pay | Admitting: Cardiology

## 2013-11-29 DIAGNOSIS — R011 Cardiac murmur, unspecified: Secondary | ICD-10-CM | POA: Insufficient documentation

## 2013-11-29 NOTE — Progress Notes (Signed)
Echocardiogram performed.  

## 2013-12-20 ENCOUNTER — Ambulatory Visit: Payer: 59 | Admitting: Internal Medicine

## 2013-12-21 ENCOUNTER — Telehealth: Payer: Self-pay | Admitting: Internal Medicine

## 2013-12-21 NOTE — Telephone Encounter (Signed)
Advise patient: Echocardiogram looks okay except for her heart is a little "stiff" Recommend losartan 25 mg one by mouth daily, #30, 1 refill. Schedule a BMP 2 weeks from today. That will help relax her heart. Also I notice she is not taking any cholesterol medication, we could refer her to the lipid clinic Dr Alferd Apa if so desire    ------  ECHO report  11-29-13  - Left ventricle: The cavity size was normal. There was mild focal basal hypertrophy of the septum. Systolic function was vigorous. The estimated ejection fraction was in the range of 65% to 70%. Wall motion was normal; there were no regional wall motion abnormalities. Features are consistent with a pseudonormal left ventricular filling pattern, with concomitant abnormal relaxation and increased filling pressure (grade 2 diastolic dysfunction). Doppler parameters are consistent with elevated ventricular end-diastolic filling pressure. - Aortic valve: Trileaflet; moderately thickened, moderately calcified leaflets. There was mild to moderate stenosis. Moderate regurgitation. - Aortic root: The aortic root was normal in size. - Mitral valve: Calcified annulus. Trivial regurgitation. - Left atrium: The atrium was mildly dilated. - Tricuspid valve: Trivial regurgitation. - Pulmonic valve: No regurgitation. - Pulmonary arteries: Systolic pressure was within the normal range. - Pericardium, extracardiac: There was no pericardial effusion.

## 2013-12-21 NOTE — Telephone Encounter (Signed)
lmovm

## 2013-12-22 NOTE — Telephone Encounter (Signed)
Left message on voice mail to return my call so that I could further explain ECHO results and the need for losartan.

## 2013-12-22 NOTE — Telephone Encounter (Signed)
LMOM, will try to call in AM

## 2013-12-22 NOTE — Telephone Encounter (Signed)
Pt states doe not understand why her heart is stiff and why she would need losartan to relax her heart. Also she states she does take cholesterol medication- lipitor and crestor.

## 2013-12-23 NOTE — Telephone Encounter (Signed)
LMOM asked for a call back 

## 2013-12-26 MED ORDER — LOSARTAN POTASSIUM 25 MG PO TABS: 25.0000 mg | ORAL_TABLET | Freq: Every day | ORAL | Status: DC

## 2013-12-26 NOTE — Telephone Encounter (Signed)
Spoke with patient and advised of results per Dr Larose Kells notes. Enacted [facility] [address]. Patient is scheduled for a follow up appt on May 5th. Patient states that she is taking her cholesterol medications.

## 2014-01-03 ENCOUNTER — Encounter: Payer: Self-pay | Admitting: Internal Medicine

## 2014-01-03 ENCOUNTER — Ambulatory Visit (INDEPENDENT_AMBULATORY_CARE_PROVIDER_SITE_OTHER): Payer: 59 | Admitting: Internal Medicine

## 2014-01-03 VITALS — BP 116/80 | HR 76 | Temp 97.6°F | Wt 193.0 lb

## 2014-01-03 DIAGNOSIS — Z23 Encounter for immunization: Secondary | ICD-10-CM

## 2014-01-03 DIAGNOSIS — E119 Type 2 diabetes mellitus without complications: Secondary | ICD-10-CM

## 2014-01-03 DIAGNOSIS — R9389 Abnormal findings on diagnostic imaging of other specified body structures: Secondary | ICD-10-CM

## 2014-01-03 DIAGNOSIS — R931 Abnormal findings on diagnostic imaging of heart and coronary circulation: Secondary | ICD-10-CM

## 2014-01-03 DIAGNOSIS — I350 Nonrheumatic aortic (valve) stenosis: Secondary | ICD-10-CM | POA: Insufficient documentation

## 2014-01-03 DIAGNOSIS — E785 Hyperlipidemia, unspecified: Secondary | ICD-10-CM

## 2014-01-03 LAB — COMPREHENSIVE METABOLIC PANEL
ALT: 23 U/L (ref 0–35)
AST: 19 U/L (ref 0–37)
Albumin: 4.1 g/dL (ref 3.5–5.2)
Alkaline Phosphatase: 69 U/L (ref 39–117)
BUN: 17 mg/dL (ref 6–23)
CALCIUM: 9.9 mg/dL (ref 8.4–10.5)
CHLORIDE: 104 meq/L (ref 96–112)
CO2: 29 meq/L (ref 19–32)
CREATININE: 0.9 mg/dL (ref 0.4–1.2)
GFR: 65.93 mL/min (ref >60.00)
GLUCOSE: 162 mg/dL — AB (ref 70–99)
Potassium: 4.2 mEq/L (ref 3.5–5.1)
Sodium: 140 mEq/L (ref 135–145)
Total Bilirubin: 0.3 mg/dL (ref 0.2–1.2)
Total Protein: 7.1 g/dL (ref 6.0–8.3)

## 2014-01-03 LAB — HEMOGLOBIN A1C: Hgb A1c MFr Bld: 7.6 % — ABNORMAL HIGH (ref 4.6–6.5)

## 2014-01-03 LAB — LIPID PANEL
Cholesterol: 159 mg/dL (ref 0–200)
HDL: 49.9 mg/dL (ref >39.00)
LDL Cholesterol: 84 mg/dL (ref 0–99)
Total CHOL/HDL Ratio: 3
Triglycerides: 128 mg/dL (ref 0.0–149.0)
VLDL: 25.6 mg/dL (ref 0.0–40.0)

## 2014-01-03 NOTE — Assessment & Plan Note (Addendum)
Echocardiogram 10-2013 show a grade 2 diastolic dysfunction and mild to moderate aortic stenosis. We had extensive discussion about the results, I suggested a low dose of losartan. Patient is quite concerned about this medication dropping her blood pressure (despite being a low dose and despite having the potential to help her kidneys in the context of diabetes).  We eventually agreed on observation, re-consider losartan and some point. --- ECHO 10-2013  - Left ventricle: The cavity size was normal. There was mild focal basal hypertrophy of the septum. Systolic function was vigorous. The estimated ejection fraction was in the range of 65% to 70%. Wall motion was normal; there were no regional wall motion abnormalities. Features are consistent with a pseudonormal left ventricular filling pattern, with concomitant abnormal relaxation and increased filling pressure (grade 2 diastolic dysfunction). Doppler parameters are consistent with elevated ventricular end-diastolic filling pressure. - Aortic valve: Trileaflet; moderately thickened, moderately calcified leaflets. There was mild to moderate stenosis. Moderate regurgitation. - Aortic root: The aortic root was normal in size. - Mitral valve: Calcified annulus. Trivial regurgitation. - Left atrium: The atrium was mildly dilated. - Tricuspid valve: Trivial regurgitation. - Pulmonic valve: No regurgitation. - Pulmonary arteries: Systolic pressure was within the normal range. - Pericardium, extracardiac: There was no pericardial effusion.

## 2014-01-03 NOTE — Progress Notes (Signed)
Pre visit review using our clinic review tool, if applicable. No additional management support is needed unless otherwise documented below in the visit note. 

## 2014-01-03 NOTE — Progress Notes (Signed)
 Subjective:    Patient ID: Nicole Duran, female    DOB: 06/22/1951, 63 y.o.   MRN: [id number]  DOS:  01/03/2014 Type of  visit: Routine office visit High cholesterol, on Crestor for the last few weeks, see assessment and plan Low vitamin D, on supplements feeling great. Abnormal echocardiogram, it was discussed in detail, see assessment and plan.   ROS Denies chest pain, difficulty breathing or lower extremity edema. No nausea, vomiting, diarrhea. No myalgias.  Past Medical History  Diagnosis Date  . Kidney stone   . Metabolic syndrome   . Menopause   . Insomnia   . ALLERGIC RHINITIS 04/22/2007  . HYPERLIPIDEMIA 04/22/2007  . 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 Anxiety     Past Surgical History  Procedure Laterality Date  . Cholecystectomy      History   Social History  . Marital Status: Widowed    Spouse Name: N/A    Number of Children: 1  . Years of Education: N/A   Occupational History  . 2 jobs    Social History Main Topics  . Smoking status: Former Smoker  . Smokeless tobacco: Never Used     Comment: never heavy use , quit in the 80s  . Alcohol Use: 0.0 oz/week     Comment: socially  . Drug Use: No  . Sexual Activity: Not on file   Other Topics Concern  . Not on file   Social History Narrative   Lives by herself, widow (1990), 1 daughter     Lost mother 2014                 Medication List       This list is accurate as of: 01/03/14 10:32 PM.  Always use your most recent med list.               ACCU-CHEK MULTICLIX LANCET DEV Kit  1 Device by Does not apply route once.     ALPRAZolam 0.5 MG tablet  Commonly known as:  XANAX  One by mouth 3 times a day as needed for anxiety, 2 at bedtime as needed for insomnia     aspirin 81 MG tablet  Take 81 mg by mouth daily.     BENADRYL PO  Take by mouth as needed.     betamethasone (augmented) 0.05 % lotion  Commonly known as:  DIPROLENE  Apply topically 2 (two) times daily.     ergocalciferol 50000  UNITS capsule  Commonly known as:  VITAMIN D2  Take 1 capsule (50,000 Units total) by mouth once a week.     fexofenadine 180 MG tablet  Commonly known as:  ALLEGRA  Take 180 mg by mouth daily.     glucose blood test strip  Commonly known as:  ONE TOUCH ULTRA TEST  Use 1 strip daily as directed.     ibuprofen 200 MG tablet  Commonly known as:  ADVIL,MOTRIN  Take 200 mg by mouth every 6 (six) hours as needed.     JANUVIA 100 MG tablet  Generic drug:  sitaGLIPtin  TAKE 1 TABLET BY MOUTH ONCE DAILY.     losartan 25 MG tablet  Commonly known as:  COZAAR  Take 1 tablet (25 mg total) by mouth daily.     metFORMIN 500 MG 24 hr tablet  Commonly known as:  GLUCOPHAGE-XR  Take 1 tablet (500 mg total) by mouth daily with breakfast.     NASACORT AQ NA  Place into the nose.   Daily     ONE TOUCH ULTRA 2 W/DEVICE Kit  Use once daily to check blood sugar     onetouch ultrasoft lancets  Use as instructed     rosuvastatin 10 MG tablet  Commonly known as:  CRESTOR  Take 1 tablet (10 mg total) by mouth daily.           Objective:   Physical Exam BP 116/80  Pulse 76  Temp(Src) 97.6 F (36.4 C)  Wt 193 lb (87.544 kg)  SpO2 96% General -- alert, well-developed, NAD. Lungs -- normal respiratory effort, no intercostal retractions, no accessory muscle use, and normal breath sounds.  Heart-- normal rate, regular rhythm, II?IV syst murmur at R upper sternum border .   Extremities-- no pretibial edema bilaterally  psych-- Cognition and judgment appear intact. Cooperative with normal attention span and concentration. No anxious or depressed appearing.        Assessment & Plan:   Today , I spent more than 25 min with the patient, >50% of the time counseling; see a/p

## 2014-01-03 NOTE — Assessment & Plan Note (Addendum)
Base on  last FLP was recommended to add zetia to Pravachol however she decided to go back on Crestor. She is tolerating very good, no myalgias, thinks is doing better  b/c she is now on vit d supplements . Plan: labs

## 2014-01-03 NOTE — Assessment & Plan Note (Signed)
Saw  Dr. Loanne Drilling, and now on metformin, request a A1c. Will do

## 2014-01-03 NOTE — Patient Instructions (Signed)
Get your blood work before you leave   Next visit is for routine check up regards your , cholesterol, blood pressure   in 4 months ,  Fasting depending on results  Please make an appointment

## 2014-01-11 ENCOUNTER — Telehealth: Payer: Self-pay

## 2014-01-11 NOTE — Telephone Encounter (Signed)
Relevant patient education assigned to patient using Emmi. ° °

## 2014-01-25 ENCOUNTER — Other Ambulatory Visit: Payer: Self-pay | Admitting: Internal Medicine

## 2014-01-25 DIAGNOSIS — E785 Hyperlipidemia, unspecified: Secondary | ICD-10-CM

## 2014-01-25 NOTE — Telephone Encounter (Signed)
[facility] [address]

## 2014-04-10 ENCOUNTER — Other Ambulatory Visit: Payer: Self-pay | Admitting: Dermatology

## 2014-04-19 ENCOUNTER — Other Ambulatory Visit: Payer: Self-pay | Admitting: Internal Medicine

## 2014-04-20 ENCOUNTER — Telehealth: Payer: Self-pay

## 2014-04-20 MED ORDER — ALPRAZOLAM 0.5 MG PO TABS: ORAL_TABLET | ORAL | Status: DC

## 2014-04-20 NOTE — Telephone Encounter (Signed)
done

## 2014-04-20 NOTE — Telephone Encounter (Signed)
[facility]

## 2014-04-20 NOTE — Telephone Encounter (Signed)
Pt is requesting refill for Xanax.  Last OV: 01/03/2014 Last Fill date: 08/12/2013 # 100 with 3 RF Last UDS: 08/23/2013: Low Risk.   Please Advise.

## 2014-05-17 ENCOUNTER — Other Ambulatory Visit: Payer: Self-pay | Admitting: Internal Medicine

## 2014-06-01 ENCOUNTER — Encounter: Payer: Self-pay | Admitting: Endocrinology

## 2014-06-01 ENCOUNTER — Ambulatory Visit (INDEPENDENT_AMBULATORY_CARE_PROVIDER_SITE_OTHER): Payer: 59 | Admitting: Endocrinology

## 2014-06-01 VITALS — BP 130/74 | HR 76 | Temp 98.1°F | Wt 191.0 lb

## 2014-06-01 DIAGNOSIS — E119 Type 2 diabetes mellitus without complications: Secondary | ICD-10-CM

## 2014-06-01 LAB — HEMOGLOBIN A1C: HEMOGLOBIN A1C: 7.8 % — AB (ref 4.6–6.5)

## 2014-06-01 MED ORDER — CANAGLIFLOZIN 300 MG PO TABS
1.0000 | ORAL_TABLET | Freq: Every day | ORAL | Status: DC
Start: 2014-06-01 — End: 2014-12-21

## 2014-06-01 NOTE — Progress Notes (Signed)
Subjective:    Patient ID: Nicole Duran, female    DOB: 04/01/1951, 63 y.o.   MRN: [id number]  HPI Pt returns for f/u of diabetes mellitus: DM type: 2 Dx'ed: 4098 Complications: none Therapy: 2 orals GDM: never DKA: never Severe hypoglycemia: never Pancreatitis: never Other:  Interval history: She wants to stop the [place], as she believes it is not working.  pt states she feels well in general. Past Medical History  Diagnosis Date  . Kidney stone   . Metabolic syndrome   . Menopause   . Insomnia   . ALLERGIC RHINITIS 04/22/2007  . HYPERLIPIDEMIA 04/22/2007  . 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 Anxiety     Past Surgical History  Procedure Laterality Date  . Cholecystectomy      History   Social History  . Marital Status: Widowed    Spouse Name: N/A    Number of Children: 1  . Years of Education: N/A   Occupational History  . 2 jobs    Social History Main Topics  . Smoking status: Former Research scientist (life sciences)  . Smokeless tobacco: Never Used     Comment: never heavy use , quit in the 80s  . Alcohol Use: 0.0 oz/week     Comment: socially  . Drug Use: No  . Sexual Activity: Not on file   Other Topics Concern  . Not on file   Social History Narrative   Lives by herself, widow (1990), 1 daughter     Lost mother 2014             Current Outpatient Prescriptions on File Prior to Visit  Medication Sig Dispense Refill  . ALPRAZolam (XANAX) 0.5 MG tablet One by mouth 3 times a day as needed for anxiety, 2 at bedtime as needed for insomnia  100 tablet  1  . aspirin 81 MG tablet Take 81 mg by mouth daily.        . betamethasone, augmented, (DIPROLENE) 0.05 % lotion Apply topically 2 (two) times daily.  60 mL  1  . Blood Glucose Monitoring Suppl (ONE TOUCH ULTRA 2) W/DEVICE KIT Use once daily to check blood sugar  1 each  0  . CRESTOR 10 MG tablet TAKE 1 TABLET BY MOUTH EVERY DAY  30 tablet  6  . DiphenhydrAMINE HCl (BENADRYL PO) Take by mouth as needed.      . ergocalciferol (VITAMIN  D2) 50000 UNITS capsule Take 1 capsule (50,000 Units total) by mouth once a week.  12 capsule  0  . fexofenadine (ALLEGRA) 180 MG tablet Take 180 mg by mouth daily.        Marland Kitchen glucose blood (ONE TOUCH ULTRA TEST) test strip Use 1 strip daily as directed.  100 each  12  . ibuprofen (ADVIL,MOTRIN) 200 MG tablet Take 200 mg by mouth every 6 (six) hours as needed.        . Lancets (ONETOUCH ULTRASOFT) lancets Use as instructed  200 each  12  . Lancets Misc. (ACCU-CHEK MULTICLIX LANCET DEV) KIT 1 Device by Does not apply route once.  1 each  0  . losartan (COZAAR) 25 MG tablet Take 1 tablet (25 mg total) by mouth daily.  30 tablet  1  . metFORMIN (GLUCOPHAGE-XR) 500 MG 24 hr tablet Take 1 tablet (500 mg total) by mouth daily with breakfast.  30 tablet  11  . sitaGLIPtin (JANUVIA) 100 MG tablet Take 1 tablet daily. NEEDS APPT WITH DR. PAZ FOR ANY FURTHER REFILLS. [id number].  30  tablet  3  . Triamcinolone Acetonide (NASACORT AQ NA) Place into the nose. Daily       No current facility-administered medications on file prior to visit.    Allergies  Allergen Reactions  . Cefuroxime Axetil Hives, Shortness Of Breath and Itching    Ceftin  . Amoxicillin-Pot Clavulanate     REACTION: n/v  . Codeine     REACTION: n/v  . Ezetimibe-Simvastatin     REACTION: nausea  . Moxifloxacin     REACTION: pt states made her "deathly sick" -- nose bleeds Avelox  . Nabumetone     REACTION: nausea Relafen  . Nitrofurantoin     REACTION: n/v Macrobid  . Prednisone     REACTION: rash  . Rosuvastatin     REACTION: pain Crestor  . Sulfonamide Derivatives     REACTION: nausea    Family History  Problem Relation Age of Onset  . Coronary artery disease Father 74    CABG  . Stroke Father 35  . Diabetes Mother   . Dementia Mother     mother also had- PAD, psychosis  . Hypertension Mother   . Cancer Neg Hx     colon, breast  . Multiple myeloma Father 70    BP 130/74  Pulse 76  Temp(Src) 98.1 F (36.7  C) (Oral)  Wt 191 lb (86.637 kg)  SpO2 94%  Review of Systems Denies edema and weight change.      Objective:   Physical Exam VITAL SIGNS:  See vs page GENERAL: no distress Pulses: dorsalis pedis intact bilat.   Feet: no deformity.  no edema Skin:  no ulcer on the feet.  normal color and temp. Neuro: sensation is intact to touch on the feet.  Lab Results  Component Value Date   HGBA1C 7.8* 06/01/2014       Assessment & Plan:  DM: mild exacerbation. Obesity, persistent.  Pt is advised to continue her efforts.    Patient is advised the following: Patient Instructions  check your blood sugar once a day.  vary the time of day when you check, between before the 3 meals, and at bedtime.  also check if you have symptoms of your blood sugar being too high or too low.  please keep a record of the readings and bring it to your next appointment here.  please call us sooner if your blood sugar goes below 70, or if you have a lot of readings over 200.  blood tests are being requested for you today.  We'll contact you with results.  Please come back for a follow-up appointment in January.

## 2014-06-01 NOTE — Patient Instructions (Addendum)
check your blood sugar once a day.  vary the time of day when you check, between before the 3 meals, and at bedtime.  also check if you have symptoms of your blood sugar being too high or too low.  please keep a record of the readings and bring it to your next appointment here.  please call us sooner if your blood sugar goes below 70, or if you have a lot of readings over 200.  blood tests are being requested for you today.  We'll contact you with results.  Please come back for a follow-up appointment in January.

## 2014-06-07 ENCOUNTER — Telehealth: Payer: Self-pay | Admitting: *Deleted

## 2014-06-07 ENCOUNTER — Other Ambulatory Visit: Payer: Self-pay

## 2014-06-07 MED ORDER — SITAGLIPTIN PHOSPHATE 100 MG PO TABS: ORAL_TABLET | ORAL | Status: DC

## 2014-06-07 MED ORDER — METFORMIN HCL ER 500 MG PO TB24
500.0000 mg | ORAL_TABLET | Freq: Every day | ORAL | Status: DC
Start: 2014-06-07 — End: 2014-12-21

## 2014-06-07 NOTE — Telephone Encounter (Signed)
Wants you to contact her concerning her medication

## 2014-06-07 NOTE — Telephone Encounter (Signed)
Called pt. She states that she does not want to begin taking Invokanna. Pt states that she is going to continue taking the Metformin and Januvia until she comes back for her follow up appointment in January. When she comes back for her follow up and has her labs done again she will decide then if she will start taking the Invokanna.

## 2014-06-07 NOTE — Telephone Encounter (Signed)
ok 

## 2014-07-24 ENCOUNTER — Other Ambulatory Visit: Payer: Self-pay

## 2014-09-20 ENCOUNTER — Telehealth: Payer: Self-pay

## 2014-09-20 MED ORDER — ALPRAZOLAM 0.5 MG PO TABS: ORAL_TABLET | ORAL | Status: DC

## 2014-09-20 NOTE — Telephone Encounter (Signed)
Pt is requesting refill on Alprazolam.  Last OV: 01/03/2014 Last Fill: 04/20/2014 # 100 1RF UDS: 08/23/2013 Low risk  Please advise.

## 2014-09-20 NOTE — Telephone Encounter (Signed)
Ok RF but she is due for a visit, please arrange.

## 2014-09-20 NOTE — Telephone Encounter (Signed)
[facility]. Letter printed and mailed to Pt informing her she is due for OV.

## 2014-09-21 ENCOUNTER — Telehealth: Payer: Self-pay | Admitting: Internal Medicine

## 2014-09-21 NOTE — Telephone Encounter (Signed)
error 

## 2014-10-19 ENCOUNTER — Other Ambulatory Visit: Payer: Self-pay | Admitting: Obstetrics and Gynecology

## 2014-10-19 DIAGNOSIS — M8580 Other specified disorders of bone density and structure, unspecified site: Secondary | ICD-10-CM

## 2014-11-01 ENCOUNTER — Encounter: Payer: 59 | Admitting: Internal Medicine

## 2014-11-06 ENCOUNTER — Other Ambulatory Visit: Payer: Self-pay

## 2014-11-11 ENCOUNTER — Other Ambulatory Visit: Payer: Self-pay | Admitting: Endocrinology

## 2014-11-14 ENCOUNTER — Other Ambulatory Visit: Payer: Self-pay | Admitting: Internal Medicine

## 2014-11-30 ENCOUNTER — Telehealth: Payer: Self-pay | Admitting: Internal Medicine

## 2014-11-30 NOTE — Telephone Encounter (Signed)
Pre Visit letter sent  °

## 2014-12-06 ENCOUNTER — Other Ambulatory Visit: Payer: Self-pay

## 2014-12-20 ENCOUNTER — Telehealth: Payer: Self-pay

## 2014-12-20 NOTE — Telephone Encounter (Signed)
Pre visit call completed 

## 2014-12-21 ENCOUNTER — Encounter: Payer: Self-pay | Admitting: Internal Medicine

## 2014-12-21 ENCOUNTER — Ambulatory Visit (INDEPENDENT_AMBULATORY_CARE_PROVIDER_SITE_OTHER): Payer: 59 | Admitting: Internal Medicine

## 2014-12-21 VITALS — BP 124/74 | HR 74 | Temp 97.8°F | Ht 66.0 in | Wt 188.4 lb

## 2014-12-21 DIAGNOSIS — M25519 Pain in unspecified shoulder: Secondary | ICD-10-CM

## 2014-12-21 DIAGNOSIS — Z23 Encounter for immunization: Secondary | ICD-10-CM

## 2014-12-21 DIAGNOSIS — R5382 Chronic fatigue, unspecified: Secondary | ICD-10-CM | POA: Diagnosis not present

## 2014-12-21 DIAGNOSIS — E119 Type 2 diabetes mellitus without complications: Secondary | ICD-10-CM

## 2014-12-21 DIAGNOSIS — Z0000 Encounter for general adult medical examination without abnormal findings: Secondary | ICD-10-CM

## 2014-12-21 LAB — COMPREHENSIVE METABOLIC PANEL
ALBUMIN: 4.2 g/dL (ref 3.5–5.2)
ALK PHOS: 99 U/L (ref 39–117)
ALT: 17 U/L (ref 0–35)
AST: 13 U/L (ref 0–37)
BILIRUBIN TOTAL: 0.4 mg/dL (ref 0.2–1.2)
BUN: 13 mg/dL (ref 6–23)
CO2: 24 mEq/L (ref 19–32)
Calcium: 10.2 mg/dL (ref 8.4–10.5)
Chloride: 105 mEq/L (ref 96–112)
Creatinine, Ser: 0.77 mg/dL (ref 0.40–1.20)
GFR: 79.71 mL/min (ref >60.00)
Glucose, Bld: 179 mg/dL — ABNORMAL HIGH (ref 70–99)
Potassium: 4.1 mEq/L (ref 3.5–5.1)
Sodium: 138 mEq/L (ref 135–145)
Total Protein: 7.5 g/dL (ref 6.0–8.3)

## 2014-12-21 LAB — LIPID PANEL
CHOL/HDL RATIO: 3
Cholesterol: 167 mg/dL (ref 0–200)
HDL: 51.3 mg/dL (ref >39.00)
LDL CALC: 95 mg/dL (ref 0–99)
NONHDL: 115.7
Triglycerides: 102 mg/dL (ref 0.0–149.0)
VLDL: 20.4 mg/dL (ref 0.0–40.0)

## 2014-12-21 LAB — CBC WITH DIFFERENTIAL/PLATELET
BASOS ABS: 0 10*3/uL (ref 0.0–0.1)
BASOS PCT: 0.3 % (ref 0.0–3.0)
Eosinophils Absolute: 0.1 10*3/uL (ref 0.0–0.7)
Eosinophils Relative: 0.5 % (ref 0.0–5.0)
HCT: 46.2 % — ABNORMAL HIGH (ref 36.0–46.0)
HEMOGLOBIN: 15.5 g/dL — AB (ref 12.0–15.0)
Lymphocytes Relative: 16.2 % (ref 12.0–46.0)
Lymphs Abs: 1.8 10*3/uL (ref 0.7–4.0)
MCHC: 33.6 g/dL (ref 30.0–36.0)
MCV: 92.9 fl (ref 78.0–100.0)
MONO ABS: 0.4 10*3/uL (ref 0.1–1.0)
MONOS PCT: 3.7 % (ref 3.0–12.0)
Neutro Abs: 8.7 10*3/uL — ABNORMAL HIGH (ref 1.4–7.7)
Neutrophils Relative %: 79.3 % — ABNORMAL HIGH (ref 43.0–77.0)
PLATELETS: 310 10*3/uL (ref 150.0–400.0)
RBC: 4.98 Mil/uL (ref 3.87–5.11)
RDW: 13.9 % (ref 11.5–15.5)
WBC: 11 10*3/uL — AB (ref 4.0–10.5)

## 2014-12-21 LAB — VITAMIN B12: VITAMIN B 12: 478 pg/mL (ref 211–911)

## 2014-12-21 LAB — TSH: TSH: 1.88 u[IU]/mL (ref 0.35–4.50)

## 2014-12-21 LAB — VITAMIN D 25 HYDROXY (VIT D DEFICIENCY, FRACTURES): VITD: 37.35 ng/mL (ref 30.00–100.00)

## 2014-12-21 LAB — HEMOGLOBIN A1C: Hgb A1c MFr Bld: 7.9 % — ABNORMAL HIGH (ref 4.6–6.5)

## 2014-12-21 MED ORDER — LOSARTAN POTASSIUM 25 MG PO TABS: 25.0000 mg | ORAL_TABLET | Freq: Every day | ORAL | Status: DC

## 2014-12-21 MED ORDER — SITAGLIPTIN PHOSPHATE 100 MG PO TABS: ORAL_TABLET | ORAL | Status: DC

## 2014-12-21 MED ORDER — METFORMIN HCL ER 500 MG PO TB24: 500.0000 mg | ORAL_TABLET | Freq: Every day | ORAL | Status: DC

## 2014-12-21 MED ORDER — CANAGLIFLOZIN 300 MG PO TABS
300.0000 mg | ORAL_TABLET | Freq: Every day | ORAL | Status: DC
Start: 2014-12-21 — End: 2015-07-23

## 2014-12-21 NOTE — Assessment & Plan Note (Addendum)
Td 2015 S/p Shingles shot  PNM shot 2014 prevnar 12-2014   never had a Cscope , again is interested but not ready for a referral, request an iFOB  Gyn -- sees Dr Rogue Bussing , they are in charge of PAPs, MMG 09-2014 per pt, DEXA pending per gyn  Discussed  diet and exercise Labs     Other issues H/o diastolic dysfunction per echo last year, no murmur felt today, on ARBs Fatigue, episode of sleepiness scale 9, borderline positive for sleep apnea, recommend weight loss and reassess in 6 months. Sleep study? Diabetes, likes to switch endocrinologists, will refer to Dr. Buddy Duty Right shoulder pain, refer to sports medicine High cholesterol, on Crestor, check labs

## 2014-12-21 NOTE — Patient Instructions (Signed)
Get your blood work before you leave    Come back to the office in 6 months   for a routine check up

## 2014-12-21 NOTE — Progress Notes (Signed)
Pre visit review using our clinic review tool, if applicable. No additional management support is needed unless otherwise documented below in the visit note. 

## 2014-12-21 NOTE — Progress Notes (Signed)
Subjective:    Patient ID: Nicole Duran, female    DOB: 02-09-1951, 64 y.o.   MRN: [id number]  DOS:  12/21/2014 Type of visit - description : cpx Interval history: Complain of fatigue, feeling tired in the afternoons usually after she takes metformin. CBGs when she feels fatigued around 130. Does not know if she snores, she does feel sleepy sometimes.   she is able to walk in the treadmill without symptoms Also complains of right shoulder pain on and off since she did some painting at home. Denies neck pain or numbness and the upper extremities ROS is otherwise negative    Review of Systems Constitutional: No fever, chills. No unexplained wt changes. No unusual sweats HEENT: No dental problems, ear discharge, facial swelling, voice changes. No eye discharge, redness or intolerance to light Respiratory: No wheezing or difficulty breathing. No cough , mucus production Cardiovascular: No CP, leg swelling or palpitations GI: no nausea, vomiting, diarrhea or abdominal pain.  No blood in the stools. No dysphagia   Endocrine: No polyphagia, polyuria or polydipsia GU: No dysuria, gross hematuria, difficulty urinating. No urinary urgency or frequency. Musculoskeletal: No joint swellings   Skin: No change in the color of the skin, palor or rash Allergic, immunologic: No environmental allergies or food allergies Neurological: No dizziness or syncope. No headaches. No diplopia, slurred speech, motor deficits, facial numbness Hematological: No enlarged lymph nodes, easy bruising or bleeding Psychiatry: No suicidal ideas, hallucinations, behavior problems or confusion. No unusual/severe anxiety or depression.     Past Medical History  Diagnosis Date  . Kidney stone   . Metabolic syndrome   . Menopause   . Insomnia   . ALLERGIC RHINITIS 04/22/2007  . HYPERLIPIDEMIA 04/22/2007  . 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 Anxiety     Past Surgical History  Procedure Laterality Date  . Cholecystectomy    .  Cataracts Bilateral 06/15 11/15    History   Social History  . Marital Status: Widowed    Spouse Name: N/A  . Number of Children: 1  . Years of Education: N/A   Occupational History  . 2 jobs    Social History Main Topics  . Smoking status: Former Research scientist (life sciences)  . Smokeless tobacco: Never Used     Comment: never heavy use , quit in the 80s  . Alcohol Use: 0.0 oz/week     Comment: socially  . Drug Use: No  . Sexual Activity: Not on file   Other Topics Concern  . Not on file   Social History Narrative   Lives by herself, widow (1990), 1 daughter ([place], [place])  , 1 Nigel Bridgeman    Lost mother 2014              Family History  Problem Relation Age of Onset  . Coronary artery disease Father 54    CABG  . Stroke Father 38  . Diabetes Mother   . Dementia Mother     mother also had- PAD, psychosis  . Hypertension Mother   . Cancer Neg Hx     colon, breast  . Multiple myeloma Father 68       Medication List       This list is accurate as of: 12/21/14  2:56 PM.  Always use your most recent med list.               ALPRAZolam 0.5 MG tablet  Commonly known as:  XANAX  One by mouth 3 times a day as needed  for anxiety, 2 at bedtime as needed for insomnia     aspirin 81 MG tablet  Take 81 mg by mouth daily.     BENADRYL PO  Take by mouth as needed.     betamethasone (augmented) 0.05 % lotion  Commonly known as:  DIPROLENE  Apply topically 2 (two) times daily.     canagliflozin 300 MG Tabs tablet  Commonly known as:  INVOKANA  Take 300 mg by mouth daily.     CRESTOR 10 MG tablet  Generic drug:  rosuvastatin  TAKE 1 TABLET BY MOUTH EVERY DAY     ibuprofen 200 MG tablet  Commonly known as:  ADVIL,MOTRIN  Take 200 mg by mouth every 6 (six) hours as needed.     losartan 25 MG tablet  Commonly known as:  COZAAR  Take 1 tablet (25 mg total) by mouth daily.     metFORMIN 500 MG 24 hr tablet  Commonly known as:  GLUCOPHAGE-XR  Take 1 tablet (500 mg total) by mouth  daily with breakfast.     NASACORT AQ NA  Place into the nose. Daily     ONE TOUCH ULTRA 2 W/DEVICE Kit  Use once daily to check blood sugar     ONE TOUCH ULTRA TEST test strip  Generic drug:  glucose blood  USE 1 STRIP DAILY AS DIRECTED.     onetouch ultrasoft lancets  Use as instructed     sitaGLIPtin 100 MG tablet  Commonly known as:  JANUVIA  Take 1 tablet daily.     ZYRTEC PO  Take 1 tablet by mouth daily as needed.           Objective:   Physical Exam BP 124/74 mmHg  Pulse 74  Temp(Src) 97.8 F (36.6 C) (Oral)  Ht _0  (1.676 m)  Wt 188 lb 6 oz (85.446 kg)  BMI 30.42 kg/m2  SpO2 97%  General:   Well developed, well nourished . NAD.  Neck:  Full range of motion. Supple. No  Thyromegaly  HEENT:  Normocephalic . Face symmetric, atraumatic Lungs:  CTA B Normal respiratory effort, no intercostal retractions, no accessory muscle use. Heart: RRR,  no murmur.  Abdomen:  Not distended, soft, non-tender. No rebound or rigidity. No mass,organomegaly Muscle skeletal: no pretibial edema bilaterally  Skin: Exposed areas without rash. Not pale. Not jaundice Neurologic:  alert & oriented X3.  Speech normal, gait appropriate for age and unassisted Strength symmetric and appropriate for age.  Psych: Cognition and judgment appear intact.  Cooperative with normal attention span and concentration.  Behavior appropriate. No anxious or depressed appearing.       Assessment & Plan:   Today , I spent more than  15  min with the patient in addition to her regular physical addressing other issues, see assessment and plan

## 2014-12-23 ENCOUNTER — Other Ambulatory Visit: Payer: Self-pay | Admitting: Internal Medicine

## 2014-12-25 ENCOUNTER — Ambulatory Visit: Payer: 59 | Admitting: Family Medicine

## 2014-12-28 ENCOUNTER — Encounter: Payer: Self-pay | Admitting: Family Medicine

## 2014-12-28 ENCOUNTER — Ambulatory Visit (INDEPENDENT_AMBULATORY_CARE_PROVIDER_SITE_OTHER): Payer: 59 | Admitting: Family Medicine

## 2014-12-28 VITALS — BP 140/70 | Ht 67.0 in | Wt 188.0 lb

## 2014-12-28 DIAGNOSIS — M25511 Pain in right shoulder: Secondary | ICD-10-CM | POA: Diagnosis not present

## 2014-12-28 NOTE — Patient Instructions (Signed)
You have rotator cuff impingement Try to avoid painful activities (overhead activities, lifting with extended arm) as much as possible. Consider aleve 2 tabs twice a day with food OR ibuprofen 3 tabs three times a day with food for pain and inflammation. Can take tylenol in addition to this. Subacromial injection may be beneficial to help with pain and to decrease inflammation. Consider physical therapy with transition to home exercise program. Do home exercise program with theraband and scapular stabilization exercises daily - these are very important for long term relief - 3 sets of 10 once a day for next 6 weeks. If not improving at follow-up we will consider further imaging, injection, physical therapy, and/or nitro patches. Follow up with me in 6 weeks or as needed.

## 2015-01-01 NOTE — Progress Notes (Signed)
PCP and referred by: Kathlene November, MD  Subjective:   HPI: Patient is a 64 y.o. female here for right shoulder pain.  Patient reports her shoulder pain started about 5 months ago. 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 Pain gets up to 4/28 with certain movements though no pain at rest. No swelling. Motion still is good. Is right handed. Worse with shrugging, using computer. No night pain  Past Medical History  Diagnosis Date  . Kidney stone   . Metabolic syndrome   . Menopause   . Insomnia   . ALLERGIC RHINITIS 04/22/2007  . HYPERLIPIDEMIA 04/22/2007  . 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 Anxiety     Current Outpatient Prescriptions on File Prior to Visit  Medication Sig Dispense Refill  . ALPRAZolam (XANAX) 0.5 MG tablet One by mouth 3 times a day as needed for anxiety, 2 at bedtime as needed for insomnia 100 tablet 0  . aspirin 81 MG tablet Take 81 mg by mouth daily.      . betamethasone, augmented, (DIPROLENE) 0.05 % lotion Apply topically 2 (two) times daily. 60 mL 1  . Blood Glucose Monitoring Suppl (ONE TOUCH ULTRA 2) W/DEVICE KIT Use once daily to check blood sugar (Patient not taking: Reported on 12/21/2014) 1 each 0  . canagliflozin (INVOKANA) 300 MG TABS tablet Take 300 mg by mouth daily. 30 tablet 4  . Cetirizine HCl (ZYRTEC PO) Take 1 tablet by mouth daily as needed.    . CRESTOR 10 MG tablet TAKE 1 TABLET BY MOUTH EVERY DAY 30 tablet 1  . DiphenhydrAMINE HCl (BENADRYL PO) Take by mouth as needed.    Marland Kitchen ibuprofen (ADVIL,MOTRIN) 200 MG tablet Take 200 mg by mouth every 6 (six) hours as needed.      Marland Kitchen losartan (COZAAR) 25 MG tablet Take 1 tablet (25 mg total) by mouth daily. 30 tablet 6  . metFORMIN (GLUCOPHAGE-XR) 500 MG 24 hr tablet Take 1 tablet (500 mg total) by mouth daily with breakfast. 30 tablet 4  . ONE TOUCH ULTRA TEST test strip USE 1 STRIP DAILY AS DIRECTED. (Patient not taking: Reported on 12/21/2014) 100 each 2  . ONETOUCH DELICA LANCETS 76O MISC Check blood sugar no more than  twice daily 100 each 11  . sitaGLIPtin (JANUVIA) 100 MG tablet Take 1 tablet daily. 30 tablet 4  . Triamcinolone Acetonide (NASACORT AQ NA) Place into the nose. Daily     No current facility-administered medications on file prior to visit.    Past Surgical History  Procedure Laterality Date  . Cholecystectomy    . Cataracts Bilateral 06/15 11/15    Allergies  Allergen Reactions  . Cefuroxime Axetil Hives, Shortness Of Breath and Itching    Ceftin  . Amoxicillin-Pot Clavulanate     REACTION: n/v  . Codeine     REACTION: n/v  . Ezetimibe-Simvastatin     REACTION: nausea  . Moxifloxacin     REACTION: pt states made her "deathly sick" -- nose bleeds Avelox  . Nabumetone     REACTION: nausea Relafen  . Nitrofurantoin     REACTION: n/v Macrobid  . Prednisone     REACTION: rash  . Rosuvastatin     REACTION: pain Crestor  . Sulfonamide Derivatives     REACTION: nausea    History   Social History  . Marital Status: Widowed    Spouse Name: N/A  . Number of Children: 1  . Years of Education: N/A   Occupational History  . 2 jobs  Social History Main Topics  . Smoking status: Former Research scientist (life sciences)  . Smokeless tobacco: Never Used     Comment: never heavy use , quit in the 80s  . Alcohol Use: 0.0 oz/week    0 Standard drinks or equivalent per week     Comment: socially  . Drug Use: No  . Sexual Activity: Not on file   Other Topics Concern  . Not on file   Social History Narrative   Lives by herself, widow (1990), 1 daughter ([place], [place])  , 1 Nigel Bridgeman    Lost mother 2014             Family History  Problem Relation Age of Onset  . Coronary artery disease Father 53    CABG  . Stroke Father 60  . Diabetes Mother   . Dementia Mother     mother also had- PAD, psychosis  . Hypertension Mother   . Cancer Neg Hx     colon, breast  . Multiple myeloma Father 70    BP 140/70 mmHg  Ht _0  (1.702 m)  Wt 188 lb (85.276 kg)  BMI 29.44 kg/m2  Review of  Systems: See HPI above.    Objective:  Physical Exam:  Gen: NAD  Right shoulder: No swelling, ecchymoses.  No gross deformity. No TTP. FROM with mild painful arc. Negative Hawkins, Neers. Negative Speeds, Yergasons. Strength 5/5 with empty can and resisted internal/external rotation. Negative apprehension. NV intact distally.    Assessment & Plan:  1. Right shoulder pain - exam relatively benign though she describes rotator cuff impingement and this came on with repetitive overhead motions when painting.  Shown home exercises to do daily.  Icing, nsaids/tylenol as needed.  Consider injection, PT, nitro patches if not improving.  F/u in 6 weeks or prn.

## 2015-01-01 NOTE — Assessment & Plan Note (Signed)
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  Shown home exercises to do daily.  Icing, nsaids/tylenol as needed.  Consider injection, PT, nitro patches if not improving.  F/u in 6 weeks or prn.

## 2015-01-05 ENCOUNTER — Other Ambulatory Visit: Payer: Self-pay | Admitting: Internal Medicine

## 2015-01-05 NOTE — Telephone Encounter (Signed)
Rx printed, awaiting MD signature.  

## 2015-01-05 NOTE — Telephone Encounter (Signed)
Faxed to [facility].

## 2015-01-05 NOTE — Telephone Encounter (Signed)
Ok 100 and 1 RF

## 2015-01-05 NOTE — Telephone Encounter (Signed)
Pt is requesting refill on Alprazolam.  Last OV: 12/21/2014  Last Fill: 09/20/2014 #100 0RF UDS: Pending  Please advise.

## 2015-01-17 ENCOUNTER — Other Ambulatory Visit: Payer: Self-pay | Admitting: Internal Medicine

## 2015-02-16 LAB — HM DIABETES FOOT EXAM: HM Diabetic Foot Exam: NORMAL

## 2015-02-27 ENCOUNTER — Ambulatory Visit
Admission: RE | Admit: 2015-02-27 | Discharge: 2015-02-27 | Disposition: A | Discharge status: Home / Self Care | Payer: 59 | Source: Ambulatory Visit | Attending: Obstetrics and Gynecology | Admitting: Obstetrics and Gynecology

## 2015-02-27 DIAGNOSIS — M8580 Other specified disorders of bone density and structure, unspecified site: Secondary | ICD-10-CM

## 2015-03-12 ENCOUNTER — Other Ambulatory Visit: Payer: Self-pay | Admitting: Internal Medicine

## 2015-03-12 NOTE — Telephone Encounter (Signed)
Caller name: Shakenya Stoneberg  Relation to pt: self  Call back number: [phone] Pharmacy:  [id number] - [place], [place] [phone] (Phone) [phone] (Fax)         Reason for call:  Pt requesting a refil rosuvastatin (CRESTOR) 10 MG tablet.l

## 2015-03-12 NOTE — Telephone Encounter (Signed)
[facility]rest[facility] to [facility] on 01/17/2015 #30 tablets and 5 refills. Informed pharmacy to refill medications.

## 2015-04-12 ENCOUNTER — Other Ambulatory Visit: Payer: Self-pay

## 2015-04-12 MED ORDER — ROSUVASTATIN CALCIUM 10 MG PO TABS: 10.0000 mg | ORAL_TABLET | Freq: Every day | ORAL | Status: DC

## 2015-05-14 ENCOUNTER — Other Ambulatory Visit: Payer: Self-pay | Admitting: Internal Medicine

## 2015-05-15 NOTE — Telephone Encounter (Signed)
Pt is requesting refill on Alprazolam.  Last OV: 12/21/2014 Last Fill: 01/05/2015 #100 1RF UDS: 08/23/2013 Low risk  Pt due for UDS and contract at next OV.   Please advise.

## 2015-05-15 NOTE — Telephone Encounter (Signed)
Rx printed, awaiting MD signature.  

## 2015-05-15 NOTE — Telephone Encounter (Signed)
Rx faxed to [facility].  

## 2015-05-15 NOTE — Telephone Encounter (Signed)
Okay 100 and 1 RF

## 2015-05-17 ENCOUNTER — Other Ambulatory Visit: Payer: Self-pay | Admitting: Internal Medicine

## 2015-06-27 LAB — HEMOGLOBIN A1C: HEMOGLOBIN A1C: 7.5 % — AB (ref 4.0–6.0)

## 2015-06-28 ENCOUNTER — Other Ambulatory Visit: Payer: Self-pay

## 2015-07-02 ENCOUNTER — Telehealth: Payer: Self-pay | Admitting: Internal Medicine

## 2015-07-02 NOTE — Telephone Encounter (Signed)
Caller name: Meyer   Relationship to patient: Self   Can be reached: [phone]  Pharmacy:  Reason for call: pt would like to know what her 9mths fu visit is for? She says that she isn't sure.    Please advise.

## 2015-07-02 NOTE — Telephone Encounter (Signed)
Routine check-up, she is on several medication that she needs repeat labs for when she returns for 6 month F/U.

## 2015-07-03 NOTE — Telephone Encounter (Signed)
Informed pt, she expressed understanding 

## 2015-07-04 ENCOUNTER — Ambulatory Visit: Payer: 59 | Admitting: Internal Medicine

## 2015-07-23 ENCOUNTER — Ambulatory Visit (INDEPENDENT_AMBULATORY_CARE_PROVIDER_SITE_OTHER): Payer: 59 | Admitting: Internal Medicine

## 2015-07-23 ENCOUNTER — Encounter: Payer: Self-pay | Admitting: Internal Medicine

## 2015-07-23 VITALS — BP 126/72 | HR 68 | Temp 97.6°F | Ht 66.0 in | Wt 188.2 lb

## 2015-07-23 DIAGNOSIS — G4700 Insomnia, unspecified: Secondary | ICD-10-CM

## 2015-07-23 DIAGNOSIS — E119 Type 2 diabetes mellitus without complications: Secondary | ICD-10-CM

## 2015-07-23 DIAGNOSIS — D72829 Elevated white blood cell count, unspecified: Secondary | ICD-10-CM

## 2015-07-23 DIAGNOSIS — Z09 Encounter for follow-up examination after completed treatment for conditions other than malignant neoplasm: Secondary | ICD-10-CM | POA: Insufficient documentation

## 2015-07-23 DIAGNOSIS — F411 Generalized anxiety disorder: Secondary | ICD-10-CM

## 2015-07-23 LAB — BASIC METABOLIC PANEL
BUN: 14 mg/dL (ref 6–23)
CHLORIDE: 104 meq/L (ref 96–112)
CO2: 29 mEq/L (ref 19–32)
CREATININE: 0.77 mg/dL (ref 0.40–1.20)
Calcium: 9.9 mg/dL (ref 8.4–10.5)
GFR: 79.56 mL/min (ref >60.00)
Glucose, Bld: 176 mg/dL — ABNORMAL HIGH (ref 70–99)
POTASSIUM: 4.4 meq/L (ref 3.5–5.1)
SODIUM: 140 meq/L (ref 135–145)

## 2015-07-23 LAB — MICROALBUMIN / CREATININE URINE RATIO
CREATININE, U: 232.3 mg/dL
MICROALB UR: 2.2 mg/dL — AB (ref 0.0–1.9)
Microalb Creat Ratio: 0.9 mg/g (ref 0.0–30.0)

## 2015-07-23 NOTE — Assessment & Plan Note (Signed)
DM: Now under the care of endocrinology, invokana was discontinued, on Janumet. Patient not taking losartan (?reason). Check a BMP and microalbumin. Anxiety and insomnia: Xanax, check a UDS , sign a contract High cholesterol: On Crestor RTC 4 /2017 for a CPX

## 2015-07-23 NOTE — Progress Notes (Signed)
Subjective:    Patient ID: Nicole Duran, female    DOB: Apr 28, 1951, 64 y.o.   MRN: [id number]  DOS:  07/23/2015 Type of visit - description : Routine visit Interval history: DM: Now per endocrinology, medications were changed. Not taking losartan High cholesterol: Taking Crestor without apparent side effects, Crestor was removed from her allergy list. Taking Xanax as needed with good results.   Review of Systems Denies chest pain or difficulty breathing. No nausea vomiting. Occasional diarrhea since she started Janumet  Past Medical History  Diagnosis Date  . Kidney stone   . Metabolic syndrome   . Menopause   . Insomnia   . ALLERGIC RHINITIS 04/22/2007  . HYPERLIPIDEMIA 04/22/2007  . 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 Anxiety     Past Surgical History  Procedure Laterality Date  . Cholecystectomy    . Cataracts Bilateral 06/15 11/15    Social History   Social History  . Marital Status: Widowed    Spouse Name: N/A  . Number of Children: 1  . Years of Education: N/A   Occupational History  . 2 jobs    Social History Main Topics  . Smoking status: Former Research scientist (life sciences)  . Smokeless tobacco: Never Used     Comment: never heavy use , quit in the 80s  . Alcohol Use: 0.0 oz/week    0 Standard drinks or equivalent per week     Comment: socially  . Drug Use: No  . Sexual Activity: Not on file   Other Topics Concern  . Not on file   Social History Narrative   Lives by herself, widow (1990), 1 daughter [place], [place])  , [place] mother 2014                 Medication List       This list is accurate as of: 07/23/15  5:05 PM.  Always use your most recent med list.               ALPRAZolam 0.5 MG tablet  Commonly known as:  XANAX  Take 1 tablet by mouth three times daily as needed for anxiety and take 2 tablets by mouth at bedtime as needed for sleep.     aspirin 81 MG tablet  Take 81 mg by mouth daily.     BENADRYL PO  Take by mouth as needed.     betamethasone (augmented) 0.05 % lotion  Commonly known as:  DIPROLENE  Apply topically 2 (two) times daily.     ibuprofen 200 MG tablet  Commonly known as:  ADVIL,MOTRIN  Take 200 mg by mouth every 6 (six) hours as needed.     JANUMET XR 50-1000 MG Tb24  Generic drug:  SitaGLIPtin-MetFORMIN HCl  Take 1 tablet by mouth daily.     NASACORT AQ NA  Place into the nose. Daily     ONE TOUCH ULTRA 2 W/DEVICE Kit  Use once daily to check blood sugar     ONE TOUCH ULTRA TEST test strip  Generic drug:  glucose blood  USE 1 STRIP DAILY AS DIRECTED.     ONETOUCH DELICA LANCETS 37Q Misc  Check blood sugar no more than twice daily     rosuvastatin 10 MG tablet  Commonly known as:  CRESTOR  Take 1 tablet (10 mg total) by mouth daily.     ZYRTEC PO  Take 1 tablet by mouth daily as needed.  Objective:   Physical Exam BP 126/72 mmHg  Pulse 68  Temp(Src) 97.6 F (36.4 C) (Oral)  Ht '5\' 6"'  (1.676 m)  Wt 188 lb 4 oz (85.39 kg)  BMI 30.40 kg/m2  SpO2 96% General:   Well developed, well nourished . NAD.  HEENT:  Normocephalic . Face symmetric, atraumatic Lungs:  CTA B Normal respiratory effort, no intercostal retractions, no accessory muscle use. Heart: RRR,  no murmur.  No pretibial edema bilaterally  Skin: Not pale. Not jaundice Neurologic:  alert & oriented X3.  Speech normal, gait appropriate for age and unassisted Psych--  Cognition and judgment appear intact.  Cooperative with normal attention span and concentration.  Behavior appropriate. No anxious or depressed appearing.      Assessment & Plan:   Assessment> DM  Dx 2009-- Dr Chalmers Cater  Hyperlipidemia diastolic dysfx per echo 0630  Anxiety, insomnia Menopausal H/o Kidney stones  Plan: DM: Now under the care of endocrinology, invokana was discontinued, on Janumet. Patient not taking losartan (?reason). Check a BMP and microalbumin. Anxiety and insomnia: Xanax, check a UDS , sign a contract High  cholesterol: On Crestor RTC 4 /2017 for a CPX

## 2015-07-23 NOTE — Patient Instructions (Signed)
Get your blood work before you leave     Next visit  for a  complete physical exam and by April 2017  Please schedule an appointment at the front desk Please come back fasting

## 2015-07-23 NOTE — Progress Notes (Signed)
Pre visit review using our clinic review tool, if applicable. No additional management support is needed unless otherwise documented below in the visit note. 

## 2015-07-24 LAB — CBC WITH DIFFERENTIAL/PLATELET
BASOS ABS: 0 10*3/uL (ref 0.0–0.1)
Basophils Relative: 0.4 % (ref 0.0–3.0)
EOS ABS: 0.1 10*3/uL (ref 0.0–0.7)
Eosinophils Relative: 1.7 % (ref 0.0–5.0)
HEMATOCRIT: 45.5 % (ref 36.0–46.0)
Hemoglobin: 14.9 g/dL (ref 12.0–15.0)
LYMPHS PCT: 27.5 % (ref 12.0–46.0)
Lymphs Abs: 2.3 10*3/uL (ref 0.7–4.0)
MCHC: 32.7 g/dL (ref 30.0–36.0)
MCV: 94.1 fl (ref 78.0–100.0)
Monocytes Absolute: 0.3 10*3/uL (ref 0.1–1.0)
Monocytes Relative: 3.4 % (ref 3.0–12.0)
NEUTROS PCT: 67 % (ref 43.0–77.0)
Neutro Abs: 5.5 10*3/uL (ref 1.4–7.7)
PLATELETS: 293 10*3/uL (ref 150.0–400.0)
RBC: 4.83 Mil/uL (ref 3.87–5.11)
RDW: 14 % (ref 11.5–15.5)
WBC: 8.2 10*3/uL (ref 4.0–10.5)

## 2015-07-28 ENCOUNTER — Other Ambulatory Visit: Payer: Self-pay | Admitting: Endocrinology

## 2015-08-03 ENCOUNTER — Other Ambulatory Visit: Payer: Self-pay | Admitting: Internal Medicine

## 2015-11-07 LAB — HM PAP SMEAR

## 2015-11-07 LAB — HM MAMMOGRAPHY

## 2015-11-09 ENCOUNTER — Encounter: Payer: Self-pay | Admitting: Internal Medicine

## 2015-12-03 ENCOUNTER — Other Ambulatory Visit: Payer: Self-pay | Admitting: Internal Medicine

## 2015-12-04 NOTE — Telephone Encounter (Signed)
Rx faxed to [facility].  

## 2015-12-04 NOTE — Telephone Encounter (Signed)
Okay #100 and 1 refill

## 2015-12-04 NOTE — Telephone Encounter (Signed)
Pt is requesting refill on Alprazolam.  Last OV: 07/23/2015 Last Fill: 05/15/2015 #100 and 1RF UDS: 08/23/2013 Low risk  Please advise.

## 2015-12-04 NOTE — Telephone Encounter (Signed)
Rx printed, awaiting MD signature.  

## 2016-02-14 ENCOUNTER — Encounter: Payer: Self-pay | Admitting: Internal Medicine

## 2016-02-14 ENCOUNTER — Ambulatory Visit (INDEPENDENT_AMBULATORY_CARE_PROVIDER_SITE_OTHER): Payer: 59 | Admitting: Internal Medicine

## 2016-02-14 ENCOUNTER — Telehealth: Payer: Self-pay

## 2016-02-14 VITALS — BP 122/76 | HR 66 | Temp 98.1°F | Ht 66.0 in | Wt 186.1 lb

## 2016-02-14 DIAGNOSIS — Z0000 Encounter for general adult medical examination without abnormal findings: Secondary | ICD-10-CM | POA: Diagnosis not present

## 2016-02-14 DIAGNOSIS — E559 Vitamin D deficiency, unspecified: Secondary | ICD-10-CM | POA: Diagnosis not present

## 2016-02-14 DIAGNOSIS — E785 Hyperlipidemia, unspecified: Secondary | ICD-10-CM | POA: Diagnosis not present

## 2016-02-14 LAB — CBC WITH DIFFERENTIAL/PLATELET
Basophils Absolute: 0.1 10*3/uL (ref 0.0–0.1)
Basophils Relative: 0.6 % (ref 0.0–3.0)
EOS PCT: 1.6 % (ref 0.0–5.0)
Eosinophils Absolute: 0.1 10*3/uL (ref 0.0–0.7)
HEMATOCRIT: 44 % (ref 36.0–46.0)
HEMOGLOBIN: 14.5 g/dL (ref 12.0–15.0)
LYMPHS ABS: 2 10*3/uL (ref 0.7–4.0)
LYMPHS PCT: 24 % (ref 12.0–46.0)
MCHC: 32.9 g/dL (ref 30.0–36.0)
MCV: 93.9 fl (ref 78.0–100.0)
MONOS PCT: 4.9 % (ref 3.0–12.0)
Monocytes Absolute: 0.4 10*3/uL (ref 0.1–1.0)
Neutro Abs: 5.8 10*3/uL (ref 1.4–7.7)
Neutrophils Relative %: 68.9 % (ref 43.0–77.0)
Platelets: 266 10*3/uL (ref 150.0–400.0)
RBC: 4.69 Mil/uL (ref 3.87–5.11)
RDW: 13.7 % (ref 11.5–15.5)
WBC: 8.4 10*3/uL (ref 4.0–10.5)

## 2016-02-14 LAB — URINALYSIS, ROUTINE W REFLEX MICROSCOPIC
BILIRUBIN URINE: NEGATIVE
Hgb urine dipstick: NEGATIVE
KETONES UR: NEGATIVE
NITRITE: NEGATIVE
Specific Gravity, Urine: 1.015 (ref 1.000–1.030)
Total Protein, Urine: NEGATIVE
URINE GLUCOSE: NEGATIVE
Urobilinogen, UA: 0.2 (ref 0.0–1.0)
pH: 6 (ref 5.0–8.0)

## 2016-02-14 LAB — BASIC METABOLIC PANEL
BUN: 18 mg/dL (ref 6–23)
CHLORIDE: 104 meq/L (ref 96–112)
CO2: 31 mEq/L (ref 19–32)
CREATININE: 0.8 mg/dL (ref 0.40–1.20)
Calcium: 9.6 mg/dL (ref 8.4–10.5)
GFR: 76 mL/min (ref >60.00)
Glucose, Bld: 182 mg/dL — ABNORMAL HIGH (ref 70–99)
Potassium: 4.4 mEq/L (ref 3.5–5.1)
Sodium: 139 mEq/L (ref 135–145)

## 2016-02-14 LAB — LIPID PANEL
Cholesterol: 163 mg/dL (ref 0–200)
HDL: 46.4 mg/dL (ref >39.00)
LDL Cholesterol: 96 mg/dL (ref 0–99)
NONHDL: 116.77
Total CHOL/HDL Ratio: 4
Triglycerides: 105 mg/dL (ref 0.0–149.0)
VLDL: 21 mg/dL (ref 0.0–40.0)

## 2016-02-14 LAB — ALT: ALT: 15 U/L (ref 0–35)

## 2016-02-14 LAB — AST: AST: 13 U/L (ref 0–37)

## 2016-02-14 MED ORDER — AZELASTINE HCL 0.1 % NA SOLN: 2.0000 | Freq: Every evening | NASAL | Status: DC | PRN

## 2016-02-14 NOTE — Progress Notes (Signed)
Subjective:    Patient ID: Nicole Duran, female    DOB: 06-14-1951, 65 y.o.   MRN: [id number]  DOS:  02/14/2016 Type of visit - description : cpx Interval history: no major concerns    Review of Systems Constitutional: No fever. No chills. No unexplained wt changes. No unusual sweats  HEENT: No dental problems, no ear discharge, no facial swelling, no voice changes. No eye discharge, no eye  redness , no  intolerance to light   Respiratory: No wheezing , no  difficulty breathing. No cough , no mucus production  Cardiovascular: No CP, no leg swelling , no  Palpitations  GI: no nausea, no vomiting, no diarrhea , no  abdominal pain.  No blood in the stools. No dysphagia, no odynophagia    Endocrine: No polyphagia, no polyuria , no polydipsia  GU: long history of urinary frequently but no other symptoms. No dysuria, gross hematuria, difficulty urinating. No urinary urgency   Musculoskeletal: Occasional knee pain after walking in the treadmill, worse on the right. No swelling  Skin: No change in the color of the skin, palor , no  Rash  Allergic, immunologic: On and off sinus congestion associated with headache and left ear discomfort. + Sneezing, itchy eyes and nose. Occasional  nasal discharge  Neurological: No dizziness no  syncope. . No diplopia, no slurred, no slurred speech, no motor deficits, no facial  Numbness  Hematological: No enlarged lymph nodes, no easy bruising , no unusual bleedings  Psychiatry: No suicidal ideas, no hallucinations, no beavior problems, no confusion.  No unusual/severe anxiety, no depression   Past Medical History  Diagnosis Date  . Kidney stone   . Metabolic syndrome   . Menopause   . Insomnia   . ALLERGIC RHINITIS 04/22/2007  . HYPERLIPIDEMIA 04/22/2007  . 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 Anxiety     Past Surgical History  Procedure Laterality Date  . Cholecystectomy    . Cataracts Bilateral 06/15 11/15    Social History   Social History    . Marital Status: Widowed    Spouse Name: N/A  . Number of Children: 1  . Years of Education: N/A   Occupational History  . [employer], customer service    Social History Main Topics  . Smoking status: Former Research scientist (life sciences)  . Smokeless tobacco: Never Used     Comment: never heavy use , quit in the 80s  . Alcohol Use: 0.0 oz/week    0 Standard drinks or equivalent per week     Comment: socially  . Drug Use: No  . Sexual Activity: Not on file   Other Topics Concern  . Not on file   Social History Narrative   Lives by herself, widow (1990), 1 daughter social worker ([place], [place])  , 1 Nigel Bridgeman    Lost mother 2014              Family History  Problem Relation Age of Onset  . Coronary artery disease Father 34    CABG  . Stroke Father 27  . Diabetes Mother   . Dementia Mother     mother also had- PAD, psychosis  . Hypertension Mother   . Cancer Neg Hx     colon, breast  . Multiple myeloma Father 33       Medication List       This list is accurate as of: 02/14/16  6:58 PM.  Always use your most recent med list.  ALPRAZolam 0.5 MG tablet  Commonly known as:  XANAX  TAKE 1 TAB THREE TIMES A DAY AS NEEDED FOR ANXIETY AND 2 TABLETS AT BEDTIME IF NEEDED FOR SLEEP     aspirin 81 MG tablet  Take 81 mg by mouth daily.     azelastine 0.1 % nasal spray  Commonly known as:  ASTELIN  Place 2 sprays into both nostrils at bedtime as needed for rhinitis. Use in each nostril as directed     BENADRYL PO  Take by mouth as needed. Reported on 02/14/2016     ibuprofen 200 MG tablet  Commonly known as:  ADVIL,MOTRIN  Take 200 mg by mouth every 6 (six) hours as needed.     JANUMET XR 50-500 MG Tb24  Generic drug:  SitaGLIPtin-MetFORMIN HCl  Take 1 tablet by mouth daily. Reported on 02/14/2016     NASACORT AQ NA  Place into the nose. Daily     ONE TOUCH ULTRA 2 w/Device Kit  Use once daily to check blood sugar     ONE TOUCH ULTRA TEST test strip  Generic drug:   glucose blood  USE 1 STRIP DAILY AS DIRECTED.     ONETOUCH DELICA LANCETS 09P Misc  Check blood sugar no more than twice daily     rosuvastatin 10 MG tablet  Commonly known as:  CRESTOR  Take 1 tablet (10 mg total) by mouth daily.     Vitamin D (Cholecalciferol) 1000 units Tabs  Take 400 mg by mouth daily.     ZYRTEC PO  Take 1 tablet by mouth daily as needed.           Objective:   Physical Exam BP 122/76 mmHg  Pulse 66  Temp(Src) 98.1 F (36.7 C) (Oral)  Ht '5\' 6"'  (1.676 m)  Wt 186 lb 2 oz (84.426 kg)  BMI 30.06 kg/m2  SpO2 97%  General:   Well developed, well nourished . NAD.  Neck: No  thyromegaly  HEENT:  Normocephalic . Face symmetric, atraumatic. Nose is slightly congested, TMs normal, sinuses mildly TTP bilaterally. Lungs:  CTA B Normal respiratory effort, no intercostal retractions, no accessory muscle use. Heart: RRR,  no murmur.  No pretibial edema bilaterally  Abdomen:  Not distended, soft, non-tender. No rebound or rigidity.   Skin: Exposed areas without rash. Not pale. Not jaundice Neurologic:  alert & oriented X3.  Speech normal, gait appropriate for age and unassisted Strength symmetric and appropriate for age.  Psych: Cognition and judgment appear intact.  Cooperative with normal attention span and concentration.  Behavior appropriate. No anxious or depressed appearing.    Assessment & Plan:   Assessment> DM  Dx 2009--per endo  Hyperlipidemia Anxiety, insomnia Echocardiogram 10-2013 show a grade 2 diastolic dysfunction and mild to moderate aortic stenosis. Menopausal H/o Kidney stones  PLAN: Hyperlipidemia: Continue Crestor, check a FLP, AST, ALT Diastolic dysfunction: BP normal, not taking ARBs, no sx, no murmurs. Consider recheck echocardiogram in 2 or 3 years. Anxiety: Takes Xanax rarely, check a UDS Vitamin D deficiency: Check labs Allergies: Continue with Flonase, add Astelin, if not better will let me know (abx?). RTC 6 months

## 2016-02-14 NOTE — Telephone Encounter (Signed)
Form completed and faxed to [facility] at [phone], copy sent for scanning and original mailed to Pt. Received fax confirmation on 02/14/2016 at 1143.

## 2016-02-14 NOTE — Assessment & Plan Note (Addendum)
Td 2015;  zostavax 2013; PNM shot 2014; prevnar 12-2014   CCS: did no sent iFOB back last year, 3 modalities for CCS discussed, not ready for cscope, will think about options  Gyn -- saw Dr Rogue Bussing this year and  had a  MMG 10-2015 DEXA -- per gyn  Discussed  diet and exercise Labs   : BMP, LFTs, FLP, CBC, vitamin D

## 2016-02-14 NOTE — Assessment & Plan Note (Signed)
Hyperlipidemia: Continue Crestor, check a FLP, AST, ALT Diastolic dysfunction: BP normal, not taking ARBs, no sx, no murmurs. Consider recheck echocardiogram in 2 or 3 years. Anxiety: Takes Xanax rarely, check a UDS Vitamin D deficiency: Check labs Allergies: Continue with Flonase, add Astelin, if not better will let me know (abx?). RTC 6 months

## 2016-02-14 NOTE — Patient Instructions (Signed)
Get your blood work before you leave    Next visit in 6 months, no fasting.  Please consider visit these websites for more information Regards the healthcare power of attorney:  www.begintheconversation.org  theconversationproject.org    Fall Prevention and Home Safety Falls cause injuries and can affect all age groups. It is possible to use preventive measures to significantly decrease the likelihood of falls. There are many simple measures which can make your home safer and prevent falls. OUTDOORS  Repair cracks and edges of walkways and driveways.  Remove high doorway thresholds.  Trim shrubbery on the main path into your home.  Have good outside lighting.  Clear walkways of tools, rocks, debris, and clutter.  Check that handrails are not broken and are securely fastened. Both sides of steps should have handrails.  Have leaves, snow, and ice cleared regularly.  Use sand or salt on walkways during winter months.  In the garage, clean up grease or oil spills. BATHROOM  Install night lights.  Install grab bars by the toilet and in the tub and shower.  Use non-skid mats or decals in the tub or shower.  Place a plastic non-slip stool in the shower to sit on, if needed.  Keep floors dry and clean up all water on the floor immediately.  Remove soap buildup in the tub or shower on a regular basis.  Secure bath mats with non-slip, double-sided rug tape.  Remove throw rugs and tripping hazards from the floors. BEDROOMS  Install night lights.  Make sure a bedside light is easy to reach.  Do not use oversized bedding.  Keep a telephone by your bedside.  Have a firm chair with side arms to use for getting dressed.  Remove throw rugs and tripping hazards from the floor. KITCHEN  Keep handles on pots and pans turned toward the center of the stove. Use back burners when possible.  Clean up spills quickly and allow time for drying.  Avoid walking on wet  floors.  Avoid hot utensils and knives.  Position shelves so they are not too high or low.  Place commonly used objects within easy reach.  If necessary, use a sturdy step stool with a grab bar when reaching.  Keep electrical cables out of the way.  Do not use floor polish or wax that makes floors slippery. If you must use wax, use non-skid floor wax.  Remove throw rugs and tripping hazards from the floor. STAIRWAYS  Never leave objects on stairs.  Place handrails on both sides of stairways and use them. Fix any loose handrails. Make sure handrails on both sides of the stairways are as long as the stairs.  Check carpeting to make sure it is firmly attached along stairs. Make repairs to worn or loose carpet promptly.  Avoid placing throw rugs at the top or bottom of stairways, or properly secure the rug with carpet tape to prevent slippage. Get rid of throw rugs, if possible.  Have an electrician put in a light switch at the top and bottom of the stairs. OTHER FALL PREVENTION TIPS  Wear low-heel or rubber-soled shoes that are supportive and fit well. Wear closed toe shoes.  When using a stepladder, make sure it is fully opened and both spreaders are firmly locked. Do not climb a closed stepladder.  Add color or contrast paint or tape to grab bars and handrails in your home. Place contrasting color strips on first and last steps.  Learn and use mobility aids as needed.  Install an electrical emergency response system.  Turn on lights to avoid dark areas. Replace light bulbs that burn out immediately. Get light switches that glow.  Arrange furniture to create clear pathways. Keep furniture in the same place.  Firmly attach carpet with non-skid or double-sided tape.  Eliminate uneven floor surfaces.  Select a carpet pattern that does not visually hide the edge of steps.  Be aware of all pets. OTHER HOME SAFETY TIPS  Set the water temperature for 120 F (48.8 C).  Keep  emergency numbers on or near the telephone.  Keep smoke detectors on every level of the home and near sleeping areas. Document Released: 08/08/2002 Document Revised: 02/17/2012 Document Reviewed: 11/07/2011 [facility] Patient Information 2015 [place], [place]. This information is not intended to replace advice given to you by your health care provider. Make sure you discuss any questions you have with your health care provider.   Preventive Care for Adults Ages 85 and over  Blood pressure check.** / Every 1 to 2 years.  Lipid and cholesterol check.**/ Every 5 years beginning at age 36.  Lung cancer screening. / Every year if you are aged 69-80 years and have a 30-pack-year history of smoking and currently smoke or have quit within the past 15 years. Yearly screening is stopped once you have quit smoking for at least 15 years or develop a health problem that would prevent you from having lung cancer treatment.  Fecal occult blood test (FOBT) of stool. / Every year beginning at age 41 and continuing until age 13. You may not have to do this test if you get a colonoscopy every 10 years.  Flexible sigmoidoscopy** or colonoscopy.** / Every 5 years for a flexible sigmoidoscopy or every 10 years for a colonoscopy beginning at age 57 and continuing until age 8.  Hepatitis C blood test.** / For all people born from [birth year] through 1967 and any individual with known risks for hepatitis C.  Abdominal aortic aneurysm (AAA) screening.** / A one-time screening for ages 28 to 39 years who are current or former smokers.  Skin self-exam. / Monthly.  Influenza vaccine. / Every year.  Tetanus, diphtheria, and acellular pertussis (Tdap/Td) vaccine.** / 1 dose of Td every 10 years.  Varicella vaccine.** / Consult your health care provider.  Zoster vaccine.** / 1 dose for adults aged 82 years or older.  Pneumococcal 13-valent conjugate (PCV13) vaccine.** / Consult your health care provider.  Pneumococcal  polysaccharide (PPSV23) vaccine.** / 1 dose for all adults aged 87 years and older.  Meningococcal vaccine.** / Consult your health care provider.  Hepatitis A vaccine.** / Consult your health care provider.  Hepatitis B vaccine.** / Consult your health care provider.  Haemophilus influenzae type b (Hib) vaccine.** / Consult your health care provider. **Family history and personal history of risk and conditions may change your health care provider's recommendations. Document Released: 10/14/2001 Document Revised: 08/23/2013 Document Reviewed: 01/13/2011 [facility] Patient Information 2015 [place], [place]. This information is not intended to replace advice given to you by your health care provider. Make sure you discuss any questions you have with your health care provider.

## 2016-02-14 NOTE — Progress Notes (Signed)
Pre visit review using our clinic review tool, if applicable. No additional management support is needed unless otherwise documented below in the visit note. 

## 2016-02-17 LAB — VITAMIN D 1,25 DIHYDROXY
VITAMIN D 1, 25 (OH) TOTAL: 47 pg/mL (ref 18–72)
VITAMIN D3 1, 25 (OH): 47 pg/mL

## 2016-04-29 ENCOUNTER — Other Ambulatory Visit: Payer: Self-pay | Admitting: Internal Medicine

## 2016-04-30 NOTE — Telephone Encounter (Signed)
Rx faxed to [facility].  

## 2016-04-30 NOTE — Telephone Encounter (Signed)
Pt is requesting refill on Alprazolam.  Last OV: 02/14/2016 Last Fill: 12/04/2015 #100 and 1RF UDS: 02/14/2016 Pending  Please advise.

## 2016-04-30 NOTE — Telephone Encounter (Signed)
Rx printed, awaiting MD signature.  

## 2016-04-30 NOTE — Telephone Encounter (Signed)
Okay #100, 1 refill

## 2016-05-05 ENCOUNTER — Other Ambulatory Visit: Payer: Self-pay | Admitting: Internal Medicine

## 2016-06-05 LAB — HM DIABETES EYE EXAM

## 2016-07-31 ENCOUNTER — Encounter: Payer: Self-pay | Admitting: Family Medicine

## 2016-07-31 ENCOUNTER — Ambulatory Visit (INDEPENDENT_AMBULATORY_CARE_PROVIDER_SITE_OTHER): Payer: 59 | Admitting: Family Medicine

## 2016-07-31 VITALS — BP 124/78 | HR 67 | Temp 98.0°F | Ht 66.0 in | Wt 187.0 lb

## 2016-07-31 DIAGNOSIS — J209 Acute bronchitis, unspecified: Secondary | ICD-10-CM

## 2016-07-31 MED ORDER — PROMETHAZINE-CODEINE 6.25-10 MG/5ML PO SYRP: 5.0000 mL | ORAL_SOLUTION | Freq: Four times a day (QID) | ORAL | 0 refills | Status: DC | PRN

## 2016-07-31 MED ORDER — BENZONATATE 100 MG PO CAPS: 100.0000 mg | ORAL_CAPSULE | Freq: Three times a day (TID) | ORAL | 0 refills | Status: DC | PRN

## 2016-07-31 MED FILL — PROMETHAZINE-CODEINE SYRUP: 6.25-10 | 6 days supply | Qty: 120 | Fill #0

## 2016-07-31 NOTE — Progress Notes (Addendum)
Chief Complaint  Patient presents with  . Cough    Nicole Duran here for URI complaints.  Duration: 6 days  Associated symptoms: productive cough, ST, congestion, L ear pain Denies: fever, SOB, ST Treatment to date: ear drainage, mucinex, Allegra Sick contacts: Yes  ROS:  Const: Denies fevers HEENT: As noted in HPI Lungs: No SOB  Past Medical History:  Diagnosis Date  . ALLERGIC RHINITIS 04/22/2007  . Anxiety   . 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 HYPERLIPIDEMIA 04/22/2007  . Insomnia   . Kidney stone   . Menopause   . Metabolic syndrome    Family History  Problem Relation Age of Onset  . Coronary artery disease Father 13    CABG  . Stroke Father 28  . Diabetes Mother   . Dementia Mother     mother also had- PAD, psychosis  . Hypertension Mother   . Cancer Neg Hx     colon, breast  . Multiple myeloma Father 70    BP 124/78 (BP Location: Left Arm, Patient Position: Sitting, Cuff Size: Normal)   Pulse 67   Temp 98 F (36.7 C) (Oral)   Ht _0  (1.676 m)   Wt 187 lb (84.8 kg)   BMI 30.18 kg/m  General: Awake, alert, appears stated age HEENT: AT, [place], ears patent b/l and TM's neg, nares patent w/o discharge, pharynx pink and without exudates, MMM Neck: No masses or asymmetry Heart: RRR Lungs: CTAB, no accessory muscle use Psych: Age appropriate judgment and insight, normal mood and affect  Acute bronchitis, unspecified organism - Plan: promethazine-codeine (PHENERGAN WITH CODEINE) 6.25-10 MG/5ML syrup, DISCONTINUED: benzonatate (TESSALON) 100 MG capsule  Continue only the home remedies that are helpful.  Orders as above. If medicine makes her drowsy, call back and I will call in Tessalon perles.  F/u in 1 week if symptoms worsen or fail to improve. Pt voiced understanding and agreement to the plan.  [place], DO 07/31/16 11:37 AM

## 2016-07-31 NOTE — Progress Notes (Signed)
Pre visit review using our clinic review tool, if applicable. No additional management support is needed unless otherwise documented below in the visit note. 

## 2016-08-13 ENCOUNTER — Encounter: Payer: Self-pay | Admitting: Internal Medicine

## 2016-08-14 ENCOUNTER — Encounter: Payer: Self-pay | Admitting: Internal Medicine

## 2016-08-14 ENCOUNTER — Ambulatory Visit (INDEPENDENT_AMBULATORY_CARE_PROVIDER_SITE_OTHER): Payer: 59 | Admitting: Internal Medicine

## 2016-08-14 VITALS — BP 126/74 | HR 77 | Temp 97.4°F | Resp 14 | Ht 66.0 in | Wt 190.0 lb

## 2016-08-14 DIAGNOSIS — F411 Generalized anxiety disorder: Secondary | ICD-10-CM

## 2016-08-14 DIAGNOSIS — J209 Acute bronchitis, unspecified: Secondary | ICD-10-CM

## 2016-08-14 DIAGNOSIS — N390 Urinary tract infection, site not specified: Secondary | ICD-10-CM | POA: Diagnosis not present

## 2016-08-14 DIAGNOSIS — Z1159 Encounter for screening for other viral diseases: Secondary | ICD-10-CM

## 2016-08-14 DIAGNOSIS — E785 Hyperlipidemia, unspecified: Secondary | ICD-10-CM | POA: Diagnosis not present

## 2016-08-14 DIAGNOSIS — R8281 Pyuria: Secondary | ICD-10-CM

## 2016-08-14 LAB — URINALYSIS, ROUTINE W REFLEX MICROSCOPIC
Bilirubin Urine: NEGATIVE
Ketones, ur: NEGATIVE
Leukocytes, UA: NEGATIVE
Nitrite: NEGATIVE
Specific Gravity, Urine: >=1.03 — AB (ref 1.000–1.030)
TOTAL PROTEIN, URINE-UPE24: NEGATIVE
Urine Glucose: 500 — AB
Urobilinogen, UA: 0.2 (ref 0.0–1.0)
pH: 6 (ref 5.0–8.0)

## 2016-08-14 MED ORDER — PROMETHAZINE-CODEINE 6.25-10 MG/5ML PO SYRP: 5.0000 mL | ORAL_SOLUTION | Freq: Three times a day (TID) | ORAL | 0 refills | Status: DC | PRN

## 2016-08-14 MED ORDER — DOXYCYCLINE HYCLATE 100 MG PO TABS: 100.0000 mg | ORAL_TABLET | Freq: Two times a day (BID) | ORAL | 0 refills | Status: DC

## 2016-08-14 MED ORDER — AZELASTINE HCL 0.1 % NA SOLN: 2.0000 | Freq: Every evening | NASAL | 3 refills | Status: DC | PRN

## 2016-08-14 NOTE — Progress Notes (Signed)
Pre visit review using our clinic review tool, if applicable. No additional management support is needed unless otherwise documented below in the visit note. 

## 2016-08-14 NOTE — Progress Notes (Signed)
Subjective:    Patient ID: Nicole Duran, female    DOB: 10-12-1950, 65 y.o.   MRN: [id number]  DOS:  08/14/2016 Type of visit - description : rov Interval history: Recently seen with URI, still coughing, had sinus pain and congestion. Occasionally brings up sputum, sometimes mucus is bloody (from the nose). High cholesterol: Good medication compliance    Review of Systems Denies dysuria or gross hematuria Denies fever chills No chest pain or difficulty breathing   Past Medical History:  Diagnosis Date  . ALLERGIC RHINITIS 04/22/2007  . Anxiety   . 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 HYPERLIPIDEMIA 04/22/2007  . Insomnia   . Kidney stone   . Menopause   . Metabolic syndrome     Past Surgical History:  Procedure Laterality Date  . cataracts Bilateral 06/15 11/15  . CHOLECYSTECTOMY      Social History   Social History  . Marital status: Widowed    Spouse name: N/A  . Number of children: 1  . Years of education: N/A   Occupational History  . [employer], customer service [employer]   Social History Main Topics  . Smoking status: Former Research scientist (life sciences)  . Smokeless tobacco: Never Used     Comment: never heavy use , quit in the 80s  . Alcohol use 0.0 oz/week     Comment: socially  . Drug use: No  . Sexual activity: Not on file   Other Topics Concern  . Not on file   Social History Narrative   Lives by herself, widow (1990), 1 daughter social worker ([place], [place])  , 1 Nigel Bridgeman    Lost mother 2014               Allergies as of 08/14/2016      Reactions   Cefuroxime Axetil Hives, Shortness Of Breath, Itching   Ceftin   Amoxicillin-pot Clavulanate Nausea And Vomiting   Ezetimibe-simvastatin Nausea Only   Moxifloxacin Other (See Comments)   REACTION: pt states made her "deathly sick" -- nose bleeds Avelox   Nabumetone Nausea Only   Nitrofurantoin Nausea And Vomiting   Sulfonamide Derivatives Nausea Only   Prednisone Rash      Medication List       Accurate as of  08/14/16  4:29 PM. Always use your most recent med list.          ALPRAZolam 0.5 MG tablet Commonly known as:  XANAX Take 1 tablet by mouth three times daily as needed for anxiety, and 2 tablets at bedtime if needed for sleep   aspirin 81 MG tablet Take 81 mg by mouth daily.   azelastine 0.1 % nasal spray Commonly known as:  ASTELIN Place 2 sprays into both nostrils at bedtime as needed for rhinitis. Use in each nostril as directed   BENADRYL PO Take by mouth as needed. Reported on 02/14/2016   doxycycline 100 MG tablet Commonly known as:  VIBRA-TABS Take 1 tablet (100 mg total) by mouth 2 (two) times daily.   ibuprofen 200 MG tablet Commonly known as:  ADVIL,MOTRIN Take 200 mg by mouth every 6 (six) hours as needed.   JANUMET XR 50-500 MG Tb24 Generic drug:  SitaGLIPtin-MetFORMIN HCl Take 1 tablet by mouth daily. Reported on 02/14/2016   NASACORT AQ NA Place into the nose. Daily   ONE TOUCH ULTRA 2 w/Device Kit Use once daily to check blood sugar   ONE TOUCH ULTRA TEST test strip Generic drug:  glucose blood USE 1 STRIP DAILY  AS DIRECTED.   ONETOUCH DELICA LANCETS 40J Misc Check blood sugar no more than twice daily   promethazine-codeine 6.25-10 MG/5ML syrup Commonly known as:  PHENERGAN with CODEINE Take 5 mLs by mouth every 8 (eight) hours as needed for cough.   rosuvastatin 10 MG tablet Commonly known as:  CRESTOR Take 1 tablet (10 mg total) by mouth daily.   Vitamin D (Cholecalciferol) 1000 units Tabs Take 400 mg by mouth daily.   ZYRTEC PO Take 1 tablet by mouth daily as needed.          Objective:   Physical Exam BP 126/74 (BP Location: Left Arm, Patient Position: Sitting, Cuff Size: Normal)   Pulse 77   Temp 97.4 F (36.3 C) (Oral)   Resp 14   Ht '5\' 6"'  (1.676 m)   Wt 190 lb (86.2 kg)   SpO2 98%   BMI 30.67 kg/m  General:   Well developed, well nourished . NAD.  HEENT:  Normocephalic . Face symmetric, atraumatic. Nose congested,  sinuses TTP bilaterally Lungs:  CTA B Normal respiratory effort, no intercostal retractions, no accessory muscle use. Heart: RRR,  no murmur.  No pretibial edema bilaterally  Skin: Not pale. Not jaundice Neurologic:  alert & oriented X3.  Speech normal, gait appropriate for age and unassisted Psych--  Cognition and judgment appear intact.  Cooperative with normal attention span and concentration.  Behavior appropriate. No anxious or depressed appearing.      Assessment & Plan:   Assessment> DM  Dx 2009--per endo Dr Chalmers Cater Hyperlipidemia Anxiety, insomnia-- xanax per pcp Echocardiogram 10-2013 show a grade 2 diastolic dysfunction and mild to moderate aortic stenosis. Menopausal H/o Kidney stones  PLAN: Suspected sinusitis: Not improving in several days, will try doxycycline, nasal sprays, continue with a low dose of codeine. Call if not better High cholesterol: Good compliance of medication, controlled. Anxiety: on Xanax: Check a UDS Screening for hepatitis C History of pyuria: Asx, check a UA urine culture RTC 01/2016 CPX

## 2016-08-14 NOTE — Patient Instructions (Signed)
Rest, fluids , tylenol  For cough:  Take Mucinex DM twice a day as needed until better  For nasal congestion: Use OTC Nasocort or Flonase : 2 nasal sprays on each side of the nose in the morning until you feel better Use ASTELIN a prescribed spray : 2 nasal sprays on each side of the nose at night until you feel better   Avoid decongestants such as  Pseudoephedrine or phenylephrine    Take the antibiotic as prescribed  (doxycycline)  Call if not gradually better over the next  10 days  Call anytime if the symptoms are severe  ------------------------------ Next visit  for a physical exam 01/2017

## 2016-08-14 NOTE — Assessment & Plan Note (Signed)
Suspected sinusitis: Not improving in several days, will try doxycycline, nasal sprays, continue with a low dose of codeine. Call if not better High cholesterol: Good compliance of medication, controlled. Anxiety: on Xanax: Check a UDS Screening for hepatitis C History of pyuria: Asx, check a UA urine culture RTC 01/2016 CPX

## 2016-08-15 LAB — URINE CULTURE

## 2016-08-15 LAB — HEPATITIS C ANTIBODY: HCV AB: NEGATIVE

## 2016-08-21 ENCOUNTER — Telehealth: Payer: Self-pay

## 2016-08-21 NOTE — Telephone Encounter (Signed)
UDS: 08/14/2016  Positive for Alprazolam   Low risk per PCP 08/21/2016

## 2016-08-24 ENCOUNTER — Other Ambulatory Visit: Payer: Self-pay | Admitting: Internal Medicine

## 2016-08-26 ENCOUNTER — Telehealth: Payer: Self-pay | Admitting: Internal Medicine

## 2016-08-26 MED ORDER — ROSUVASTATIN CALCIUM 10 MG PO TABS: 10.0000 mg | ORAL_TABLET | Freq: Every day | ORAL | 1 refills | Status: DC

## 2016-08-26 MED ORDER — JANUMET XR 50-500 MG PO TB24: 1.0000 | ORAL_TABLET | Freq: Every day | ORAL | 1 refills | Status: DC

## 2016-08-26 NOTE — Telephone Encounter (Signed)
Pt provided phone number for [facility] - [phone] and fax : [phone]

## 2016-08-26 NOTE — Telephone Encounter (Signed)
Pt called in because she says that next year she will be going on medicare/ retiring. She says that she spoke with her insurance and they stated that she will be using [facility] [place]. She says that she would like to have 3 month supply on her medication Rosuvastatin.    Rx Id # [id number]

## 2016-08-26 NOTE — Telephone Encounter (Signed)
Rxs sent to [facility]. 

## 2016-10-08 ENCOUNTER — Other Ambulatory Visit: Payer: Self-pay | Admitting: Internal Medicine

## 2016-10-08 NOTE — Telephone Encounter (Signed)
Ok 100 and 1 RF 

## 2016-10-08 NOTE — Telephone Encounter (Signed)
Rx faxed to [facility].  

## 2016-10-08 NOTE — Telephone Encounter (Signed)
Pt is requesting refill on Alprazolam.  Last OV: 08/14/2016 Last Fill: 04/30/2016 #100 and 1RF UDS: 08/14/2016 Low risk  Please advise.

## 2016-10-08 NOTE — Telephone Encounter (Signed)
Rx printed, awaiting MD signature.  

## 2016-10-16 DIAGNOSIS — E1165 Type 2 diabetes mellitus with hyperglycemia: Secondary | ICD-10-CM | POA: Diagnosis not present

## 2016-10-16 DIAGNOSIS — E7800 Pure hypercholesterolemia, unspecified: Secondary | ICD-10-CM | POA: Diagnosis not present

## 2016-10-23 DIAGNOSIS — E1165 Type 2 diabetes mellitus with hyperglycemia: Secondary | ICD-10-CM | POA: Diagnosis not present

## 2016-10-23 DIAGNOSIS — E7800 Pure hypercholesterolemia, unspecified: Secondary | ICD-10-CM | POA: Diagnosis not present

## 2016-11-02 ENCOUNTER — Other Ambulatory Visit: Payer: Self-pay | Admitting: Internal Medicine

## 2016-11-21 ENCOUNTER — Ambulatory Visit (INDEPENDENT_AMBULATORY_CARE_PROVIDER_SITE_OTHER): Payer: Medicare Other | Admitting: Internal Medicine

## 2016-11-21 ENCOUNTER — Encounter: Payer: Self-pay | Admitting: Internal Medicine

## 2016-11-21 VITALS — BP 118/68 | HR 60 | Temp 97.8°F | Resp 14 | Ht 66.0 in | Wt 185.4 lb

## 2016-11-21 DIAGNOSIS — J320 Chronic maxillary sinusitis: Secondary | ICD-10-CM

## 2016-11-21 NOTE — Progress Notes (Signed)
Pre visit review using our clinic review tool, if applicable. No additional management support is needed unless otherwise documented below in the visit note. 

## 2016-11-21 NOTE — Patient Instructions (Addendum)
Rest, fluids , tylenol  For cough:  Take Mucinex DM twice a day as needed until better  For nasal congestion: Use OTC Nasocort or Flonase : 2 nasal sprays on each side of the nose in the morning until you feel better Use ASTELIN a prescribed spray : 2 nasal sprays on each side of the nose at night until you feel better   Avoid decongestants such as  Pseudoephedrine or phenylephrine     Call if not gradually better over the next  10 days  Call anytime if the symptoms are severe

## 2016-11-21 NOTE — Progress Notes (Signed)
Subjective:    Patient ID: Nicole Duran, female    DOB: April 20, 1951, 66 y.o.   MRN: [id number]  DOS:  11/21/2016 Type of visit - description : acute Interval history: Sx started 3 days ago: Face congestion, postnasal dripping, cough. She is coughing greenish sputum but she feels is from the postnasal dripping.   Review of Systems Subjective fever with the onset of symptoms. No nausea or vomiting No achiness No itchy eyes or nose  Past Medical History:  Diagnosis Date  . ALLERGIC RHINITIS 04/22/2007  . Anxiety   . 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 HYPERLIPIDEMIA 04/22/2007  . Insomnia   . Kidney stone   . Menopause   . Metabolic syndrome     Past Surgical History:  Procedure Laterality Date  . cataracts Bilateral 06/15 11/15  . CHOLECYSTECTOMY      Social History   Social History  . Marital status: Widowed    Spouse name: N/A  . Number of children: 1  . Years of education: N/A   Occupational History  . 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 Smoking status: Former Research scientist (life sciences)  . Smokeless tobacco: Never Used     Comment: never heavy use , quit in the 80s  . Alcohol use 0.0 oz/week     Comment: socially  . Drug use: No  . Sexual activity: Not on file   Other Topics Concern  . Not on file   Social History Narrative   Lives by herself, widow (1990), 1 daughter social worker ([place], [place])  , 1 Nigel Bridgeman    Lost mother 2014               Allergies as of 11/21/2016      Reactions   Cefuroxime Axetil Hives, Shortness Of Breath, Itching   Ceftin   Amoxicillin-pot Clavulanate Nausea And Vomiting   Ezetimibe-simvastatin Nausea Only   Moxifloxacin Other (See Comments)   REACTION: pt states made her "deathly sick" -- nose bleeds Avelox   Nabumetone Nausea Only   Nitrofurantoin Nausea And Vomiting   Sulfonamide Derivatives Nausea Only   Prednisone Rash      Medication List       Accurate as of 11/21/16 11:59  PM. Always use your most recent med list.          ALPRAZolam 0.5 MG tablet Commonly known as:  XANAX TAKE 1 TABLET BY MOUTH 3 TIMES A DAY AS NEEDED FOR ANIXETY AND 2 TABS AT BEDTIME IF NEEDED FOR SLEEP   aspirin 81 MG tablet Take 81 mg by mouth daily.   azelastine 0.1 % nasal spray Commonly known as:  ASTELIN Place 2 sprays into both nostrils at bedtime as needed for rhinitis. Use in each nostril as directed   BENADRYL PO Take by mouth as needed. Reported on 02/14/2016   ibuprofen 200 MG tablet Commonly known as:  ADVIL,MOTRIN Take 200 mg by mouth every 6 (six) hours as needed.   JANUMET XR 50-500 MG Tb24 Generic drug:  SitaGLIPtin-MetFORMIN HCl Take 1 tablet by mouth daily.   NASACORT AQ NA Place into the nose. Daily   ONE TOUCH ULTRA 2 w/Device Kit Use once daily to check blood sugar   ONE TOUCH ULTRA TEST test strip Generic drug:  glucose blood USE 1 STRIP DAILY AS DIRECTED.   ONETOUCH DELICA LANCETS 16O Misc Check blood sugar no more than twice daily   promethazine-codeine 6.25-10 MG/5ML syrup Commonly known  as:  PHENERGAN with CODEINE Take 5 mLs by mouth every 8 (eight) hours as needed for cough.   rosuvastatin 10 MG tablet Commonly known as:  CRESTOR Take 1 tablet (10 mg total) by mouth daily.   Vitamin D (Cholecalciferol) 1000 units Tabs Take 400 mg by mouth daily.   ZYRTEC PO Take 1 tablet by mouth daily as needed.          Objective:   Physical Exam BP 118/68 (BP Location: Left Arm, Patient Position: Sitting, Cuff Size: Normal)   Pulse 60   Temp 97.8 F (36.6 C) (Oral)   Resp 14   Ht 5' 6" (1.676 m)   Wt 185 lb 6 oz (84.1 kg)   SpO2 95%   BMI 29.92 kg/m  General:   Well developed, well nourished . NAD.  HEENT:  Normocephalic . Face symmetric, atraumatic. TMs: Right side slightly bulge, left side flat, no redness. Nose congested Sinuses: Mildly TTP, worse on the right. Lungs:  CTA B Normal respiratory effort, no intercostal  retractions, no accessory muscle use. Heart: RRR,  no murmur.  No pretibial edema bilaterally  Skin: Not pale. Not jaundice Neurologic:  alert & oriented X3.  Speech normal, gait appropriate for age and unassisted Psych--  Cognition and judgment appear intact.  Cooperative with normal attention span and concentration.  Behavior appropriate. No anxious or depressed appearing.      Assessment & Plan:  Assessment> DM  Dx 2009--per endo Dr Chalmers Cater Hyperlipidemia Anxiety, insomnia-- xanax per pcp Echocardiogram 10-2013 show a grade 2 diastolic dysfunction and mild to moderate aortic stenosis. Menopausal H/o Kidney stones  PLAN: Suspect chronic sinusitis: On chart review, she has on and off sinus symptoms, she is usually TTP @ the sinuses consequently I suspect this is rather chronic sinusitis. Will try to stay away from antibiotics since she is allergic to a number of them. Also allergic to prednisone although she does not recall what happened when she took that ~ 15 years ago. Plan: Mucinex, Astelin, Flonase, continue Benadryl or Allegra which she has, refer to ENT.

## 2016-11-23 NOTE — Assessment & Plan Note (Signed)
Suspect chronic sinusitis: On chart review, she has on and off sinus symptoms, she is usually TTP @ the sinuses consequently I suspect this is rather chronic sinusitis. Will try to stay away from antibiotics since she is allergic to a number of them. Also allergic to prednisone although she does not recall what happened when she took that ~ 15 years ago. Plan: Mucinex, Astelin, Flonase, continue Benadryl or Allegra which she has, refer to ENT.

## 2016-11-25 ENCOUNTER — Other Ambulatory Visit: Payer: Self-pay | Admitting: Obstetrics and Gynecology

## 2016-11-25 DIAGNOSIS — Z124 Encounter for screening for malignant neoplasm of cervix: Secondary | ICD-10-CM | POA: Diagnosis not present

## 2016-11-25 DIAGNOSIS — Z1231 Encounter for screening mammogram for malignant neoplasm of breast: Secondary | ICD-10-CM | POA: Diagnosis not present

## 2016-11-25 LAB — HM PAP SMEAR

## 2016-11-25 LAB — HM MAMMOGRAPHY

## 2016-11-27 ENCOUNTER — Encounter: Payer: Self-pay | Admitting: Internal Medicine

## 2016-11-27 LAB — CYTOLOGY - PAP

## 2016-11-29 ENCOUNTER — Other Ambulatory Visit: Payer: Self-pay | Admitting: Internal Medicine

## 2016-12-01 ENCOUNTER — Other Ambulatory Visit: Payer: Self-pay | Admitting: Obstetrics and Gynecology

## 2016-12-01 DIAGNOSIS — R030 Elevated blood-pressure reading, without diagnosis of hypertension: Secondary | ICD-10-CM

## 2016-12-01 DIAGNOSIS — F419 Anxiety disorder, unspecified: Secondary | ICD-10-CM

## 2016-12-01 DIAGNOSIS — L659 Nonscarring hair loss, unspecified: Secondary | ICD-10-CM

## 2016-12-01 DIAGNOSIS — N950 Postmenopausal bleeding: Secondary | ICD-10-CM

## 2016-12-01 DIAGNOSIS — M8580 Other specified disorders of bone density and structure, unspecified site: Secondary | ICD-10-CM

## 2017-02-09 ENCOUNTER — Telehealth: Payer: Self-pay | Admitting: Internal Medicine

## 2017-02-09 MED ORDER — ROSUVASTATIN CALCIUM 10 MG PO TABS: 10.0000 mg | ORAL_TABLET | Freq: Every day | ORAL | 0 refills | Status: DC

## 2017-02-09 NOTE — Telephone Encounter (Signed)
Caller name: Relationship to patient: Self Can be reached: [phone]  Pharmacy:  [place], [place] [facility] [phone] (Phone) [phone] (Fax)     Reason for call: refill rosuvastatin (CRESTOR) 10 MG tablet [[id number]

## 2017-02-09 NOTE — Telephone Encounter (Signed)
Rx sent 

## 2017-02-16 DIAGNOSIS — E1165 Type 2 diabetes mellitus with hyperglycemia: Secondary | ICD-10-CM | POA: Diagnosis not present

## 2017-02-23 DIAGNOSIS — E1165 Type 2 diabetes mellitus with hyperglycemia: Secondary | ICD-10-CM | POA: Diagnosis not present

## 2017-02-23 DIAGNOSIS — E7800 Pure hypercholesterolemia, unspecified: Secondary | ICD-10-CM | POA: Diagnosis not present

## 2017-02-24 NOTE — Progress Notes (Addendum)
 Subjective:   Nicole Duran is a 66 y.o. female who presents for an Initial Medicare Annual Wellness Visit.  The Patient was informed that the wellness visit is to identify future health risk and educate and initiate measures that can reduce risk for increased disease through the lifespan.   Describes health as fair, good or great? Great.   Review of Systems    No ROS.  Medicare Wellness Visit. Additional risk factors are reflected in the social history. Cardiac Risk Factors include: advanced age (>55men, >65 women);diabetes mellitus;dyslipidemia;obesity (BMI >30kg/m2) Sleep patterns: Wakes occasionally to urinate. Takes xanax for sleep when needed. Sleeps 6 hrs at least. Feels rested. Home Safety/Smoke Alarms: Feels safe in home. Smoke alarms in place.  Living environment; residence and Firearm Safety: Lives alone. No guns. No stairs. Seat Belt Safety/Bike Helmet: Wears seat belt.   Counseling:   Eye Exam- hx of cataract surgery. Dr.Shapiro yearly. Dental- Dr.Hooker every 6 months.  Female:   Pap- Last 11/25/16:   Normal    Mammo- Last 11/25/16: No result on file.    Dexa scan-  Last 02/27/15:   Osteopenia. Already has next appt scheduled in July [facility]- Pt declines    Objective:    Today's Vitals   02/27/17 1319  BP: 126/60  Pulse: 66  SpO2: 98%  Weight: 177 lb 12.8 oz (80.6 kg)  Height: 5' 6" (1.676 m)   Body mass index is 28.7 kg/m.   Current Medications (verified) Outpatient Encounter Prescriptions as of 02/27/2017  Medication Sig  . ALPRAZolam (XANAX) 0.5 MG tablet TAKE 1 TABLET BY MOUTH 3 TIMES A DAY AS NEEDED FOR ANIXETY AND 2 TABS AT BEDTIME IF NEEDED FOR SLEEP  . aspirin 81 MG tablet Take 81 mg by mouth daily.    . azelastine (ASTELIN) 0.1 % nasal spray Place 2 sprays into both nostrils at bedtime as needed for rhinitis. Use in each nostril as directed  . Blood Glucose Monitoring Suppl (ONE TOUCH ULTRA 2) W/DEVICE KIT Use once daily to check blood sugar  .  Cetirizine HCl (ZYRTEC PO) Take 1 tablet by mouth daily as needed.  . DiphenhydrAMINE HCl (BENADRYL PO) Take by mouth as needed. Reported on 02/14/2016  . ibuprofen (ADVIL,MOTRIN) 200 MG tablet Take 200 mg by mouth every 6 (six) hours as needed.    . JANUMET XR 50-500 MG TB24 Take 1 tablet by mouth daily.  . ONE TOUCH ULTRA TEST test strip USE 1 STRIP DAILY AS DIRECTED.  . ONETOUCH DELICA LANCETS 33G MISC CHECK BLOOD SUGAR NO MORE THAN TWICE DAILY  . rosuvastatin (CRESTOR) 10 MG tablet Take 1 tablet (10 mg total) by mouth daily.  . Triamcinolone Acetonide (NASACORT AQ NA) Place into the nose. Daily  . Vitamin D, Cholecalciferol, 1000 units TABS Take 400 mg by mouth daily.  . promethazine-codeine (PHENERGAN WITH CODEINE) 6.25-10 MG/5ML syrup Take 5 mLs by mouth every 8 (eight) hours as needed for cough. (Patient not taking: Reported on 11/21/2016)   No facility-administered encounter medications on file as of 02/27/2017.     Allergies (verified) Cefuroxime axetil; Amoxicillin-pot clavulanate; Amoxicillin-pot clavulanate; Ezetimibe-simvastatin; Moxifloxacin; Nabumetone; Nitrofurantoin; Sulfonamide derivatives; and Prednisone   History: Past Medical History:  Diagnosis Date  . ALLERGIC RHINITIS 04/22/2007  . Anxiety   . 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 HYPERLIPIDEMIA 04/22/2007  . Insomnia   . Kidney stone   . Menopause   . Metabolic syndrome    Past Surgical History:  Procedure Laterality Date  . cataracts Bilateral   06/15 11/15  . CHOLECYSTECTOMY     Family History  Problem Relation Age of Onset  . Coronary artery disease Father 65       CABG  . Stroke Father 70  . Multiple myeloma Father 70  . Diabetes Mother   . Dementia Mother        mother also had- PAD, psychosis  . Hypertension Mother   . Cancer Neg Hx        colon, breast   Social History   Occupational History  . 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 Smoking status:  Former Smoker  . Smokeless tobacco: Never Used     Comment: never heavy use , quit in the 80s  . Alcohol use 0.0 oz/week     Comment: socially  . Drug use: No  . Sexual activity: No    Tobacco Counseling Counseling given: No   Activities of Daily Living In your present state of health, do you have any difficulty performing the following activities: 02/27/2017  Hearing? N  Vision? N  Difficulty concentrating or making decisions? N  Walking or climbing stairs? N  Dressing or bathing? N  Doing errands, shopping? N  Preparing Food and eating ? N  Using the Toilet? N  In the past six months, have you accidently leaked urine? N  Do you have problems with loss of bowel control? N  Managing your Medications? N  Managing your Finances? N  Housekeeping or managing your Housekeeping? N  Some recent data might be hidden    Immunizations and Health Maintenance Immunization History  Administered Date(s) Administered  . Influenza-Unspecified 06/02/2011, 06/01/2013, 05/30/2014  . Pneumococcal Conjugate-13 12/21/2014  . Pneumococcal Polysaccharide-23 08/22/2013  . Td 09/02/2003  . Tetanus 01/03/2014  . Zoster 05/12/2012   Health Maintenance Due  Topic Date Due  . COLONOSCOPY  12/07/1998  . HEMOGLOBIN A1C  12/26/2015  . FOOT EXAM  02/13/2017    Patient Care Team: Paz, Jose E, MD as PCP - General Balan, Bindubal, MD as Consulting Physician (Endocrinology) Hudnall, Shane R, MD as Consulting Physician (Sports Medicine) Ellison, Sean, MD as Consulting Physician (Endocrinology) Callahan, Sidney, DO as Consulting Physician (Obstetrics and Gynecology)  Indicate any recent Medical Services you may have received from other than [facility] providers in the past year (date may be approximate).     Assessment:   This is a routine wellness examination for [name]. Physical assessment deferred to PCP.   Hearing/Vision screen  Visual Acuity Screening   Right eye Left eye Both eyes  Without  correction: 20/20 20/20 20/20  With correction:     Hearing Screening Comments: Able to hear conversational tones w/o difficulty. No issues reported.    Dietary issues and exercise activities discussed: Current Exercise Habits: Structured exercise class, Type of exercise: strength training/weights;treadmill, Time (Minutes): > 60, Frequency (Times/Week): 3, Weekly Exercise (Minutes/Week): 0, Intensity: Moderate   Diet (meal preparation, eat out, water intake, caffeinated beverages, dairy products, fruits and vegetables): in general, a "healthy" diet   :      Goals      Patient Stated   . Maintain healthy lifestyle (pt-stated)      Depression Screen PHQ 2/9 Scores 02/27/2017 02/14/2016 07/23/2015 12/21/2014 08/22/2013  PHQ - 2 Score 0 0 0 0 0    Fall Risk Fall Risk  02/27/2017 02/27/2017 02/14/2016 07/23/2015 12/21/2014  Falls in the past year? No No No No No      Cognitive Function: Ad8 score reviewed for issues:  Issues making decisions:no  Less interest in hobbies / activities:no  Repeats questions, stories (family complaining):no  Trouble using ordinary gadgets (microwave, computer, phone):no  Forgets the month or year: no  Mismanaging finances: no  Remembering appts:no  Daily problems with thinking and/or memory:no Ad8 score is=0        Screening Tests Health Maintenance  Topic Date Due  . COLONOSCOPY  12/07/1998  . HEMOGLOBIN A1C  12/26/2015  . FOOT EXAM  02/13/2017  . INFLUENZA VACCINE  04/01/2017  . OPHTHALMOLOGY EXAM  06/05/2017  . MAMMOGRAM  11/25/2017  . PNA vac Low Risk Adult (2 of 2 - PPSV23) 08/22/2018  . TETANUS/TDAP  01/04/2024  . DEXA SCAN  Completed  . Hepatitis C Screening  Completed      Plan:   Follow up with PCP as directed  Continue to eat heart healthy diet (full of fruits, vegetables, whole grains, lean protein, water--limit salt, fat, and sugar intake) and increase physical activity as tolerated.  Continue doing brain stimulating  activities (puzzles, reading, adult coloring books, staying active) to keep memory sharp.   Bring a copy of your advance directives to your next office visit.   I have personally reviewed and noted the following in the patient's chart:   . Medical and social history . Use of alcohol, tobacco or illicit drugs  . Current medications and supplements . Functional ability and status . Nutritional status . Physical activity . Advanced directives . List of other physicians . Hospitalizations, surgeries, and ER visits in previous 12 months . Vitals . Screenings to include cognitive, depression, and falls . Referrals and appointments  In addition, I have reviewed and discussed with patient certain preventive protocols, quality metrics, and best practice recommendations. A written personalized care plan for preventive services as well as general preventive health recommendations were provided to patient.     Britt, Victoria Angel, RN   02/27/2017    Jose Paz, MD  

## 2017-02-25 ENCOUNTER — Encounter: Payer: 59 | Admitting: Internal Medicine

## 2017-02-27 ENCOUNTER — Encounter: Payer: Self-pay | Admitting: *Deleted

## 2017-02-27 ENCOUNTER — Ambulatory Visit (INDEPENDENT_AMBULATORY_CARE_PROVIDER_SITE_OTHER): Payer: Medicare Other | Admitting: *Deleted

## 2017-02-27 ENCOUNTER — Encounter: Payer: Medicare Other | Admitting: Internal Medicine

## 2017-02-27 VITALS — BP 126/60 | HR 66 | Ht 66.0 in | Wt 177.8 lb

## 2017-02-27 DIAGNOSIS — Z0000 Encounter for general adult medical examination without abnormal findings: Secondary | ICD-10-CM | POA: Diagnosis not present

## 2017-02-27 NOTE — Patient Instructions (Signed)
Nicole Duran , Thank you for taking time to come for your Medicare Wellness Visit. I appreciate your ongoing commitment to your health goals. Please review the following plan we discussed and let me know if I can assist you in the future.   These are the goals we discussed: Goals      Patient Stated   . Maintain healthy lifestyle (pt-stated)       This is a list of the screening recommended for you and due dates:  Health Maintenance  Topic Date Due  . Colon Cancer Screening  12/07/1998  . Hemoglobin A1C  12/26/2015  . Complete foot exam   02/13/2017  . Flu Shot  04/01/2017  . Eye exam for diabetics  06/05/2017  . Mammogram  11/25/2017  . Pneumonia vaccines (2 of 2 - PPSV23) 08/22/2018  . Tetanus Vaccine  01/04/2024  . DEXA scan (bone density measurement)  Completed  .  Hepatitis C: One time screening is recommended by Center for Disease Control  (CDC) for  adults born from [birth year] through 1967.   Completed   Continue to eat heart healthy diet (full of fruits, vegetables, whole grains, lean protein, water--limit salt, fat, and sugar intake) and increase physical activity as tolerated.  Continue doing brain stimulating activities (puzzles, reading, adult coloring books, staying active) to keep memory sharp.   Bring a copy of your advance directives to your next office visit.   Health Maintenance for Postmenopausal Women Menopause is a normal process in which your reproductive ability comes to an end. This process happens gradually over a span of months to years, usually between the ages of 55 and 98. Menopause is complete when you have missed 12 consecutive menstrual periods. It is important to talk with your health care provider about some of the most common conditions that affect postmenopausal women, such as heart disease, cancer, and bone loss (osteoporosis). Adopting a healthy lifestyle and getting preventive care can help to promote your health and wellness. Those actions can also  lower your chances of developing some of these common conditions. What should I know about menopause? During menopause, you may experience a number of symptoms, such as:  Moderate-to-severe hot flashes.  Night sweats.  Decrease in sex drive.  Mood swings.  Headaches.  Tiredness.  Irritability.  Memory problems.  Insomnia.  Choosing to treat or not to treat menopausal changes is an individual decision that you make with your health care provider. What should I know about hormone replacement therapy and supplements? Hormone therapy products are effective for treating symptoms that are associated with menopause, such as hot flashes and night sweats. Hormone replacement carries certain risks, especially as you become older. If you are thinking about using estrogen or estrogen with progestin treatments, discuss the benefits and risks with your health care provider. What should I know about heart disease and stroke? Heart disease, heart attack, and stroke become more likely as you age. This may be due, in part, to the hormonal changes that your body experiences during menopause. These can affect how your body processes dietary fats, triglycerides, and cholesterol. Heart attack and stroke are both medical emergencies. There are many things that you can do to help prevent heart disease and stroke:  Have your blood pressure checked at least every 1-2 years. High blood pressure causes heart disease and increases the risk of stroke.  If you are 78-89 years old, ask your health care provider if you should take aspirin to prevent a heart attack  or a stroke.  Do not use any tobacco products, including cigarettes, chewing tobacco, or electronic cigarettes. If you need help quitting, ask your health care provider.  It is important to eat a healthy diet and maintain a healthy weight. ? Be sure to include plenty of vegetables, fruits, low-fat dairy products, and lean protein. ? Avoid eating foods  that are high in solid fats, added sugars, or salt (sodium).  Get regular exercise. This is one of the most important things that you can do for your health. ? Try to exercise for at least 150 minutes each week. The type of exercise that you do should increase your heart rate and make you sweat. This is known as moderate-intensity exercise. ? Try to do strengthening exercises at least twice each week. Do these in addition to the moderate-intensity exercise.  Know your numbers.Ask your health care provider to check your cholesterol and your blood glucose. Continue to have your blood tested as directed by your health care provider.  What should I know about cancer screening? There are several types of cancer. Take the following steps to reduce your risk and to catch any cancer development as early as possible. Breast Cancer  Practice breast self-awareness. ? This means understanding how your breasts normally appear and feel. ? It also means doing regular breast self-exams. Let your health care provider know about any changes, no matter how small.  If you are 34 or older, have a clinician do a breast exam (clinical breast exam or CBE) every year. Depending on your age, family history, and medical history, it may be recommended that you also have a yearly breast X-ray (mammogram).  If you have a family history of breast cancer, talk with your health care provider about genetic screening.  If you are at high risk for breast cancer, talk with your health care provider about having an MRI and a mammogram every year.  Breast cancer (BRCA) gene test is recommended for women who have family members with BRCA-related cancers. Results of the assessment will determine the need for genetic counseling and BRCA1 and for BRCA2 testing. BRCA-related cancers include these types: ? Breast. This occurs in males or females. ? Ovarian. ? Tubal. This may also be called fallopian tube cancer. ? Cancer of the  abdominal or pelvic lining (peritoneal cancer). ? Prostate. ? Pancreatic.  Cervical, Uterine, and Ovarian Cancer Your health care provider may recommend that you be screened regularly for cancer of the pelvic organs. These include your ovaries, uterus, and vagina. This screening involves a pelvic exam, which includes checking for microscopic changes to the surface of your cervix (Pap test).  For women ages 21-65, health care providers may recommend a pelvic exam and a Pap test every three years. For women ages 26-65, they may recommend the Pap test and pelvic exam, combined with testing for human papilloma virus (HPV), every five years. Some types of HPV increase your risk of cervical cancer. Testing for HPV may also be done on women of any age who have unclear Pap test results.  Other health care providers may not recommend any screening for nonpregnant women who are considered low risk for pelvic cancer and have no symptoms. Ask your health care provider if a screening pelvic exam is right for you.  If you have had past treatment for cervical cancer or a condition that could lead to cancer, you need Pap tests and screening for cancer for at least 20 years after your treatment. If Pap  tests have been discontinued for you, your risk factors (such as having a new sexual partner) need to be reassessed to determine if you should start having screenings again. Some women have medical problems that increase the chance of getting cervical cancer. In these cases, your health care provider may recommend that you have screening and Pap tests more often.  If you have a family history of uterine cancer or ovarian cancer, talk with your health care provider about genetic screening.  If you have vaginal bleeding after reaching menopause, tell your health care provider.  There are currently no reliable tests available to screen for ovarian cancer.  Lung Cancer Lung cancer screening is recommended for adults  54-42 years old who are at high risk for lung cancer because of a history of smoking. A yearly low-dose CT scan of the lungs is recommended if you:  Currently smoke.  Have a history of at least 30 pack-years of smoking and you currently smoke or have quit within the past 15 years. A pack-year is smoking an average of one pack of cigarettes per day for one year.  Yearly screening should:  Continue until it has been 15 years since you quit.  Stop if you develop a health problem that would prevent you from having lung cancer treatment.  Colorectal Cancer  This type of cancer can be detected and can often be prevented.  Routine colorectal cancer screening usually begins at age 58 and continues through age 54.  If you have risk factors for colon cancer, your health care provider may recommend that you be screened at an earlier age.  If you have a family history of colorectal cancer, talk with your health care provider about genetic screening.  Your health care provider may also recommend using home test kits to check for hidden blood in your stool.  A small camera at the end of a tube can be used to examine your colon directly (sigmoidoscopy or colonoscopy). This is done to check for the earliest forms of colorectal cancer.  Direct examination of the colon should be repeated every 5-10 years until age 57. However, if early forms of precancerous polyps or small growths are found or if you have a family history or genetic risk for colorectal cancer, you may need to be screened more often.  Skin Cancer  Check your skin from head to toe regularly.  Monitor any moles. Be sure to tell your health care provider: ? About any new moles or changes in moles, especially if there is a change in a mole's shape or color. ? If you have a mole that is larger than the size of a pencil eraser.  If any of your family members has a history of skin cancer, especially at a young age, talk with your health  care provider about genetic screening.  Always use sunscreen. Apply sunscreen liberally and repeatedly throughout the day.  Whenever you are outside, protect yourself by wearing long sleeves, pants, a wide-brimmed hat, and sunglasses.  What should I know about osteoporosis? Osteoporosis is a condition in which bone destruction happens more quickly than new bone creation. After menopause, you may be at an increased risk for osteoporosis. To help prevent osteoporosis or the bone fractures that can happen because of osteoporosis, the following is recommended:  If you are 58-45 years old, get at least 1,000 mg of calcium and at least 600 mg of vitamin D per day.  If you are older than age 76 but younger  than age 42, get at least 1,200 mg of calcium and at least 600 mg of vitamin D per day.  If you are older than age 49, get at least 1,200 mg of calcium and at least 800 mg of vitamin D per day.  Smoking and excessive alcohol intake increase the risk of osteoporosis. Eat foods that are rich in calcium and vitamin D, and do weight-bearing exercises several times each week as directed by your health care provider. What should I know about how menopause affects my mental health? Depression may occur at any age, but it is more common as you become older. Common symptoms of depression include:  Low or sad mood.  Changes in sleep patterns.  Changes in appetite or eating patterns.  Feeling an overall lack of motivation or enjoyment of activities that you previously enjoyed.  Frequent crying spells.  Talk with your health care provider if you think that you are experiencing depression. What should I know about immunizations? It is important that you get and maintain your immunizations. These include:  Tetanus, diphtheria, and pertussis (Tdap) booster vaccine.  Influenza every year before the flu season begins.  Pneumonia vaccine.  Shingles vaccine.  Your health care provider may also  recommend other immunizations. This information is not intended to replace advice given to you by your health care provider. Make sure you discuss any questions you have with your health care provider. Document Released: 10/10/2005 Document Revised: 03/07/2016 Document Reviewed: 05/22/2015 Elsevier Interactive Patient Education  2018 [employer].

## 2017-03-11 ENCOUNTER — Ambulatory Visit
Admission: RE | Admit: 2017-03-11 | Discharge: 2017-03-11 | Disposition: A | Discharge status: Home / Self Care | Payer: Medicare Other | Source: Ambulatory Visit | Attending: Obstetrics and Gynecology | Admitting: Obstetrics and Gynecology

## 2017-03-11 DIAGNOSIS — M8580 Other specified disorders of bone density and structure, unspecified site: Secondary | ICD-10-CM

## 2017-03-11 DIAGNOSIS — F419 Anxiety disorder, unspecified: Secondary | ICD-10-CM

## 2017-03-11 DIAGNOSIS — R030 Elevated blood-pressure reading, without diagnosis of hypertension: Secondary | ICD-10-CM

## 2017-03-11 DIAGNOSIS — Z780 Asymptomatic menopausal state: Secondary | ICD-10-CM | POA: Diagnosis not present

## 2017-03-11 DIAGNOSIS — L659 Nonscarring hair loss, unspecified: Secondary | ICD-10-CM

## 2017-03-11 DIAGNOSIS — M85852 Other specified disorders of bone density and structure, left thigh: Secondary | ICD-10-CM | POA: Diagnosis not present

## 2017-03-11 DIAGNOSIS — N950 Postmenopausal bleeding: Secondary | ICD-10-CM

## 2017-03-30 ENCOUNTER — Telehealth: Payer: Self-pay | Admitting: Internal Medicine

## 2017-03-30 MED ORDER — ROSUVASTATIN CALCIUM 10 MG PO TABS: 10.0000 mg | ORAL_TABLET | Freq: Every day | ORAL | 1 refills | Status: DC

## 2017-03-30 MED ORDER — JANUMET XR 50-500 MG PO TB24: 1.0000 | ORAL_TABLET | Freq: Every day | ORAL | 1 refills | Status: DC

## 2017-03-30 NOTE — Telephone Encounter (Signed)
Relation to pt: self  Call back [place]: [place] phone # [phone] fax [phone]   Reason for call:  Patient requesting a 90 day supply rosuvastatin (CRESTOR) 10 MG tablet and JANUMET XR 50-500 MG TB24

## 2017-03-30 NOTE — Telephone Encounter (Signed)
Rx sent 

## 2017-04-21 DIAGNOSIS — Z85828 Personal history of other malignant neoplasm of skin: Secondary | ICD-10-CM | POA: Diagnosis not present

## 2017-04-21 DIAGNOSIS — D485 Neoplasm of uncertain behavior of skin: Secondary | ICD-10-CM | POA: Diagnosis not present

## 2017-04-21 DIAGNOSIS — L237 Allergic contact dermatitis due to plants, except food: Secondary | ICD-10-CM | POA: Diagnosis not present

## 2017-04-21 DIAGNOSIS — L814 Other melanin hyperpigmentation: Secondary | ICD-10-CM | POA: Diagnosis not present

## 2017-04-21 DIAGNOSIS — L821 Other seborrheic keratosis: Secondary | ICD-10-CM | POA: Diagnosis not present

## 2017-04-21 DIAGNOSIS — D1801 Hemangioma of skin and subcutaneous tissue: Secondary | ICD-10-CM | POA: Diagnosis not present

## 2017-04-21 DIAGNOSIS — R21 Rash and other nonspecific skin eruption: Secondary | ICD-10-CM | POA: Diagnosis not present

## 2017-04-21 DIAGNOSIS — L859 Epidermal thickening, unspecified: Secondary | ICD-10-CM | POA: Diagnosis not present

## 2017-04-21 DIAGNOSIS — D225 Melanocytic nevi of trunk: Secondary | ICD-10-CM | POA: Diagnosis not present

## 2017-06-05 ENCOUNTER — Ambulatory Visit (INDEPENDENT_AMBULATORY_CARE_PROVIDER_SITE_OTHER): Payer: Medicare Other

## 2017-06-05 DIAGNOSIS — Z23 Encounter for immunization: Secondary | ICD-10-CM | POA: Diagnosis not present

## 2017-06-26 ENCOUNTER — Other Ambulatory Visit: Payer: Self-pay | Admitting: Internal Medicine

## 2017-06-26 NOTE — Telephone Encounter (Signed)
Ok 100, no refills.  Advised patient she is due for a physical exam

## 2017-06-26 NOTE — Telephone Encounter (Signed)
Pt is requesting refill on alprazolam 0.5mg .  Last OV: 02/27/2017 w/ Angel Last Fill: 10/08/2016 #100 and 1RF UDS: 08/14/2016 Low risk  NCCR printed; no issues noted  Please adivse.

## 2017-06-26 NOTE — Telephone Encounter (Signed)
Rx printed, awaiting MD signature.  

## 2017-06-26 NOTE — Telephone Encounter (Signed)
Rx faxed to [facility].  

## 2017-06-26 NOTE — Telephone Encounter (Signed)
Pt has appt scheduled 09/07/2017 for 6 month follow-up.

## 2017-08-03 DIAGNOSIS — E1165 Type 2 diabetes mellitus with hyperglycemia: Secondary | ICD-10-CM | POA: Diagnosis not present

## 2017-08-03 DIAGNOSIS — E7800 Pure hypercholesterolemia, unspecified: Secondary | ICD-10-CM | POA: Diagnosis not present

## 2017-08-03 LAB — BASIC METABOLIC PANEL
BUN: 17 (ref 4–21)
CREATININE: 0.9 (ref 0.5–1.1)
Glucose: 159
POTASSIUM: 5 (ref 3.4–5.3)
SODIUM: 141 (ref 137–147)

## 2017-08-03 LAB — LIPID PANEL
CHOLESTEROL: 174 (ref 0–200)
HDL: 55 (ref 35–70)
LDL Cholesterol: 95
Triglycerides: 120 (ref 40–160)

## 2017-08-03 LAB — HEPATIC FUNCTION PANEL
ALT: 25 (ref 7–35)
AST: 14 (ref 13–35)
Alkaline Phosphatase: 101 (ref 25–125)

## 2017-08-03 LAB — HEMOGLOBIN A1C: Hemoglobin A1C: 6.7

## 2017-08-17 DIAGNOSIS — E7800 Pure hypercholesterolemia, unspecified: Secondary | ICD-10-CM | POA: Diagnosis not present

## 2017-08-17 DIAGNOSIS — E1165 Type 2 diabetes mellitus with hyperglycemia: Secondary | ICD-10-CM | POA: Diagnosis not present

## 2017-09-04 DIAGNOSIS — L659 Nonscarring hair loss, unspecified: Secondary | ICD-10-CM | POA: Insufficient documentation

## 2017-09-07 ENCOUNTER — Telehealth: Payer: Self-pay | Admitting: Internal Medicine

## 2017-09-07 ENCOUNTER — Encounter: Payer: Self-pay | Admitting: Internal Medicine

## 2017-09-07 ENCOUNTER — Ambulatory Visit (INDEPENDENT_AMBULATORY_CARE_PROVIDER_SITE_OTHER): Payer: Medicare Other | Admitting: Internal Medicine

## 2017-09-07 VITALS — BP 128/62 | HR 61 | Temp 98.2°F | Resp 14 | Ht 66.0 in | Wt 177.0 lb

## 2017-09-07 DIAGNOSIS — G4700 Insomnia, unspecified: Secondary | ICD-10-CM

## 2017-09-07 DIAGNOSIS — Z79899 Other long term (current) drug therapy: Secondary | ICD-10-CM | POA: Diagnosis not present

## 2017-09-07 DIAGNOSIS — Z794 Long term (current) use of insulin: Secondary | ICD-10-CM

## 2017-09-07 DIAGNOSIS — E119 Type 2 diabetes mellitus without complications: Secondary | ICD-10-CM

## 2017-09-07 DIAGNOSIS — E785 Hyperlipidemia, unspecified: Secondary | ICD-10-CM | POA: Diagnosis not present

## 2017-09-07 DIAGNOSIS — F411 Generalized anxiety disorder: Secondary | ICD-10-CM

## 2017-09-07 NOTE — Telephone Encounter (Signed)
Copied from [place]. Topic: General - Other >> Sep 07, 2017  1:18 PM Nicole Duran, [place] wrote: Reason for CRM: pt would like a call back because she has some questions about the shingles vaccine

## 2017-09-07 NOTE — Telephone Encounter (Signed)
Can Pt have shingrix series?

## 2017-09-07 NOTE — Telephone Encounter (Signed)
LMOM informing Pt that she is a candidate for Shringex- informed because she has medicare we are unable to give in office- instructed to check [facility] and [facility] on [address] that they may have it.

## 2017-09-07 NOTE — Telephone Encounter (Signed)
Advice Only   Lolita Rieger, RMA routed conversation to [employer] 1 minute ago (1:19 PM)    Armandina Gemma, [place], RMA 2 minutes ago (1:19 PM)      Copied from [place]. Topic: General - Other >> Sep 07, 2017  1:18 PM Lolita Rieger, [place] wrote: Reason for CRM: pt would like a call back because she has some questions about the shingles vaccine

## 2017-09-07 NOTE — Telephone Encounter (Signed)
Copied from [place] [id number]. Topic: Quick Communication - See Telephone Encounter >> Sep 07, 2017 10:31 AM Percell Belt A wrote: CRM for notification. See Telephone encounter for: pt called  in and would like to get the shingrex shots.  She would like to talk to Dr Larose Kells CMA about this injection  09/07/17.

## 2017-09-07 NOTE — Telephone Encounter (Signed)
She is a candidate, currently vaccine is in back order buy might ne available at her pharmacy

## 2017-09-07 NOTE — Patient Instructions (Addendum)
  GO TO THE FRONT DESK Schedule your next appointment for a  Check up in 6-8 months    

## 2017-09-07 NOTE — Progress Notes (Signed)
Subjective:    Patient ID: Nicole Duran, female    DOB: 13-May-1951, 67 y.o.   MRN: [id number]  DOS:  09/07/2017 Type of visit - description : rov Interval history: In general doing well, no major concerns. Good med compliance without apparent side effects.  Sees endocrinology regularly. Anxiety controlled, hardly ever takes Xanax.   Review of Systems Since he retired, stress has decreased, she remains active.  Do cardio at the [facility] 3 times a week without problems except for right knee pain. The knee is not swollen, red.  Simply hurt when she exercises.  Past Medical History:  Diagnosis Date  . ALLERGIC RHINITIS 04/22/2007  . Anxiety   . 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 HYPERLIPIDEMIA 04/22/2007  . Insomnia   . Kidney stone   . Menopause   . Metabolic syndrome     Past Surgical History:  Procedure Laterality Date  . cataracts Bilateral 06/15 11/15  . CHOLECYSTECTOMY      Social History   Socioeconomic History  . Marital status: Widowed    Spouse name: Not on file  . Number of children: 1  . Years of education: Not on file  . Highest education level: Not on file  Social Needs  . Financial resource strain: Not on file  . Food insecurity - worry: Not on file  . Food insecurity - inability: Not on file  . Transportation needs - medical: Not on file  . Transportation needs - non-medical: Not on file  Occupational History  . 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 Smoking status: Former Research scientist (life sciences)  . Smokeless tobacco: Never Used  . Tobacco comment: never heavy use , quit in the 80s  Substance and Sexual Activity  . Alcohol use: Yes    Alcohol/week: 0.0 oz    Comment: socially  . Drug use: No  . Sexual activity: No  Other Topics Concern  . Not on file  Social History Narrative   Lives by herself, widow (1990), 1 daughter social worker ([place], [place])  , 1 [place] mother 2014            Allergies  as of 09/07/2017      Reactions   Cefuroxime Axetil Hives, Shortness Of Breath, Itching   Ceftin   Amoxicillin-pot Clavulanate Nausea And Vomiting   Ezetimibe-simvastatin Nausea Only   Moxifloxacin Other (See Comments)   REACTION: pt states made her "deathly sick" -- nose bleeds Avelox   Nabumetone Nausea Only   Nitrofurantoin Nausea And Vomiting   Sulfonamide Derivatives Nausea Only   Prednisone Rash      Medication List        Accurate as of 09/07/17 11:59 PM. Always use your most recent med list.          ALPRAZolam 0.5 MG tablet Commonly known as:  XANAX TAKE 1 TABLET BY MOUTH 3 TIMES A DAY AS NEEDED FOR ANXIETY & 2 TABS AT BEDTIME IF NEEDED FOR SLEEP   aspirin 81 MG tablet Take 81 mg by mouth daily.   azelastine 0.1 % nasal spray Commonly known as:  ASTELIN Place 2 sprays into both nostrils at bedtime as needed for rhinitis. Use in each nostril as directed   BENADRYL PO Take by mouth as needed. Reported on 02/14/2016   ibuprofen 200 MG tablet Commonly known as:  ADVIL,MOTRIN Take 200 mg by mouth every 6 (six) hours as needed.  JANUMET XR 50-500 MG Tb24 Generic drug:  SitaGLIPtin-MetFORMIN HCl Take 1 tablet by mouth daily.   NASACORT AQ NA Place into the nose. Daily   ONE TOUCH ULTRA 2 w/Device Kit Use once daily to check blood sugar   ONE TOUCH ULTRA TEST test strip Generic drug:  glucose blood USE 1 STRIP DAILY AS DIRECTED.   ONETOUCH DELICA LANCETS 32Z Misc CHECK BLOOD SUGAR NO MORE THAN TWICE DAILY   promethazine-codeine 6.25-10 MG/5ML syrup Commonly known as:  PHENERGAN with CODEINE Take 5 mLs by mouth every 8 (eight) hours as needed for cough.   rosuvastatin 10 MG tablet Commonly known as:  CRESTOR Take 1 tablet (10 mg total) by mouth daily.   Vitamin D (Cholecalciferol) 1000 units Tabs Take 400 mg by mouth daily.   ZYRTEC PO Take 1 tablet by mouth daily as needed.          Objective:   Physical Exam BP 128/62 (BP Location: Left Arm,  Patient Position: Sitting, Cuff Size: Small)   Pulse 61   Temp 98.2 F (36.8 C) (Oral)   Resp 14   Ht '5\' 6"'  (1.676 m)   Wt 177 lb (80.3 kg)   SpO2 97%   BMI 28.57 kg/m  General:   Well developed, well nourished . NAD.  HEENT:  Normocephalic . Face symmetric, atraumatic Lungs:  CTA B Normal respiratory effort, no intercostal retractions, no accessory muscle use. Heart: RRR, + systolic murmur II/VI.  No pretibial edema bilaterally  MSK: Knees symmetric, no effusion.  Range of motion normal. Skin: Not pale. Not jaundice Neurologic:  alert & oriented X3.  Speech normal, gait appropriate for age and unassisted Psych--  Cognition and judgment appear intact.  Cooperative with normal attention span and concentration.  Behavior appropriate. No anxious or depressed appearing.      Assessment & Plan:    Assessment  DM  Dx 2009--per endo Dr Chalmers Cater Hyperlipidemia Anxiety, insomnia-- xanax per pcp Ao stenosis per ECHO 2015 Echocardiogram 10-2013 show a grade 2 diastolic dysfunction and mild to moderate aortic stenosis. Menopausal H/o Kidney stones  PLAN: Labs 08/2017: BMP satisfactory, potassium 5.0 (within normal).  LFTs normal.  A1c 6.7 Total cholesterol 174, HDL 55, LDL 95 DM: Per Dr. Chalmers Cater, controlled High cholesterol: On Crestor, controlled. 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 Aortic stenosis: Seems asx, active without problems. Anxiety, on Xanax, UDS due  RTC 6-8 months

## 2017-09-07 NOTE — Telephone Encounter (Signed)
Duplicate note

## 2017-09-07 NOTE — Progress Notes (Signed)
Pre visit review using our clinic review tool, if applicable. No additional management support is needed unless otherwise documented below in the visit note. 

## 2017-09-07 NOTE — Assessment & Plan Note (Addendum)
Sees gyn, reports she had a PAP and MMG CCS: Has not decide to do a colonoscopy, other options discussed >> patient is not ready.

## 2017-09-08 NOTE — Telephone Encounter (Signed)
LMOM requesting call back to discuss.

## 2017-09-08 NOTE — Assessment & Plan Note (Addendum)
Labs 08/2017: BMP satisfactory, potassium 5.0 (within normal).  LFTs normal.  A1c 6.7 Total cholesterol 174, HDL 55, LDL 95 DM: Per Dr. Chalmers Cater, controlled High cholesterol: On Crestor, controlled. 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 Aortic stenosis: Seems asx, active without problems. Anxiety, on Xanax, UDS due  RTC 6-8 months

## 2017-09-12 LAB — PAIN MGMT, PROFILE 8 W/CONF, U
6 Acetylmorphine: NEGATIVE ng/mL (ref <10)
ALCOHOL METABOLITES: NEGATIVE ng/mL (ref <500)
ALPHAHYDROXYMIDAZOLAM: NEGATIVE ng/mL (ref <50)
Alphahydroxyalprazolam: 115 ng/mL — ABNORMAL HIGH (ref <25)
Alphahydroxytriazolam: NEGATIVE ng/mL (ref <50)
Aminoclonazepam: NEGATIVE ng/mL (ref <25)
Amphetamines: NEGATIVE ng/mL (ref <500)
BUPRENORPHINE, URINE: NEGATIVE ng/mL (ref <5)
Benzodiazepines: POSITIVE ng/mL — AB (ref <100)
Cocaine Metabolite: NEGATIVE ng/mL (ref <150)
Codeine: 756 ng/mL — ABNORMAL HIGH (ref <50)
Creatinine: 119.8 mg/dL
HYDROCODONE: NEGATIVE ng/mL (ref <50)
HYDROMORPHONE: NEGATIVE ng/mL (ref <50)
HYDROXYETHYLFLURAZEPAM: NEGATIVE ng/mL (ref <50)
Lorazepam: NEGATIVE ng/mL (ref <50)
MARIJUANA METABOLITE: NEGATIVE ng/mL (ref <20)
MDMA: NEGATIVE ng/mL (ref <500)
MORPHINE: NEGATIVE ng/mL (ref <50)
NORDIAZEPAM: NEGATIVE ng/mL (ref <50)
Norhydrocodone: NEGATIVE ng/mL (ref <50)
OPIATES: POSITIVE ng/mL — AB (ref <100)
Oxazepam: NEGATIVE ng/mL (ref <50)
Oxidant: NEGATIVE ug/mL (ref <200)
Oxycodone: NEGATIVE ng/mL (ref <100)
Temazepam: NEGATIVE ng/mL (ref <50)
pH: 5.8 (ref 4.5–9.0)

## 2017-09-16 ENCOUNTER — Telehealth: Payer: Self-pay | Admitting: Internal Medicine

## 2017-09-16 NOTE — Telephone Encounter (Signed)
Pt calling upset that she received a MyChart message asking if she was taking pain medications due to her urine coming back back positive for opiates and benzos. Pt adamantly states "I am not a drug addict". Pt states she has been taking cough syrup that does have codeine in it that was previously prescribed for cough and bronchitis. Pt states she currently has a cough and using the cough syrup with codeine helps and she has used the medication previously when needed. Explained to pt that the codeine in her cough syrup would result in her urine drug screen coming back positive for opiates. Pt verbalized understanding.

## 2017-09-16 NOTE — Telephone Encounter (Signed)
FYI

## 2017-09-17 NOTE — Telephone Encounter (Signed)
FYI section updated in chart.

## 2017-09-17 NOTE — Telephone Encounter (Signed)
Noted . Moderate risk

## 2017-09-25 ENCOUNTER — Other Ambulatory Visit: Payer: Self-pay | Admitting: Internal Medicine

## 2017-10-06 DIAGNOSIS — Z7984 Long term (current) use of oral hypoglycemic drugs: Secondary | ICD-10-CM | POA: Diagnosis not present

## 2017-10-06 DIAGNOSIS — Z961 Presence of intraocular lens: Secondary | ICD-10-CM | POA: Diagnosis not present

## 2017-10-06 DIAGNOSIS — E119 Type 2 diabetes mellitus without complications: Secondary | ICD-10-CM | POA: Diagnosis not present

## 2017-10-06 LAB — HM DIABETES EYE EXAM

## 2017-10-30 ENCOUNTER — Telehealth: Payer: Self-pay | Admitting: Internal Medicine

## 2017-10-30 NOTE — Telephone Encounter (Signed)
Pt is requesting refill on alprazolam.   Last OV: 09/07/2017 Last Fill: 06/26/2017 #100 and 0RF  (Pt sig: 1 tab tid prn, and 2 tabs qhs prn) UDS: 09/07/2017 Moderate risk  NCCR printed- no issues noted  Please advise.

## 2017-10-30 NOTE — Telephone Encounter (Signed)
Noted, RF sent

## 2017-11-09 ENCOUNTER — Telehealth: Payer: Self-pay

## 2017-11-09 DIAGNOSIS — E785 Hyperlipidemia, unspecified: Secondary | ICD-10-CM

## 2017-11-09 NOTE — Telephone Encounter (Signed)
We can try atorvastatin 20 mg 1 p.o. nightly #30 and 3 refill. FLP, AST, ALT in 2 months

## 2017-11-09 NOTE — Telephone Encounter (Signed)
Received fax from [facility]- cost of rosuvastatin (Crestor) 10mg  is an issue, currently paying $54 dollars. Pt is requesting lower cost alternative: Pt has allergy to ezetimibe-simvastatin, has failed pravastatin.

## 2017-11-10 MED ORDER — ATORVASTATIN CALCIUM 20 MG PO TABS: 20.0000 mg | ORAL_TABLET | Freq: Every day | ORAL | 3 refills | Status: DC

## 2017-11-10 NOTE — Telephone Encounter (Signed)
MyChart message sent to Pt. Rx sent to [facility]. AST, ALT, FLP ordered to be completed in 2 months.

## 2017-11-14 ENCOUNTER — Other Ambulatory Visit: Payer: Self-pay | Admitting: Internal Medicine

## 2017-12-08 DIAGNOSIS — N9089 Other specified noninflammatory disorders of vulva and perineum: Secondary | ICD-10-CM | POA: Diagnosis not present

## 2017-12-08 DIAGNOSIS — Z1231 Encounter for screening mammogram for malignant neoplasm of breast: Secondary | ICD-10-CM | POA: Diagnosis not present

## 2017-12-08 DIAGNOSIS — R350 Frequency of micturition: Secondary | ICD-10-CM | POA: Diagnosis not present

## 2017-12-08 DIAGNOSIS — Z124 Encounter for screening for malignant neoplasm of cervix: Secondary | ICD-10-CM | POA: Diagnosis not present

## 2017-12-08 LAB — HM PAP SMEAR

## 2017-12-08 LAB — HM MAMMOGRAPHY

## 2017-12-14 ENCOUNTER — Encounter: Payer: Self-pay | Admitting: Internal Medicine

## 2018-02-15 DIAGNOSIS — E1165 Type 2 diabetes mellitus with hyperglycemia: Secondary | ICD-10-CM | POA: Diagnosis not present

## 2018-02-15 DIAGNOSIS — E7800 Pure hypercholesterolemia, unspecified: Secondary | ICD-10-CM | POA: Diagnosis not present

## 2018-02-22 DIAGNOSIS — E7800 Pure hypercholesterolemia, unspecified: Secondary | ICD-10-CM | POA: Diagnosis not present

## 2018-02-22 DIAGNOSIS — E1165 Type 2 diabetes mellitus with hyperglycemia: Secondary | ICD-10-CM | POA: Diagnosis not present

## 2018-02-24 NOTE — Progress Notes (Deleted)
Subjective:   Nicole Duran is a 67 y.o. female who presents for Medicare Annual (Subsequent) preventive examination.  Review of Systems: No ROS.  Medicare Wellness Visit. Additional risk factors are reflected in the social history.   Sleep patterns:  Home Safety/Smoke Alarms: Feels safe in home. Smoke alarms in place.  Living environment; residence and Firearm Safety: [place] Safety/Bike Helmet: Wears seat belt.   Female:   Pap-  utd     Mammo-  utd     Dexa scan-  utd      CCS-     Objective:     Vitals: There were no vitals taken for this visit.  There is no height or weight on file to calculate BMI.  Advanced Directives 02/27/2017  Does Patient Have a Medical Advance Directive? No  Would patient like information on creating a medical advance directive? Yes (MAU/Ambulatory/Procedural Areas - Information given)    Tobacco Social History   Tobacco Use  Smoking Status Former Smoker  Smokeless Tobacco Never Used  Tobacco Comment   never heavy use , quit in the 80s     Counseling given: Not Answered Comment: never heavy use , quit in the 80s   Clinical Intake:                       Past Medical History:  Diagnosis Date  . ALLERGIC RHINITIS 04/22/2007  . Anxiety   . 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 HYPERLIPIDEMIA 04/22/2007  . Insomnia   . Kidney stone   . Menopause   . Metabolic syndrome    Past Surgical History:  Procedure Laterality Date  . cataracts Bilateral 06/15 11/15  . CHOLECYSTECTOMY     Family History  Problem Relation Age of Onset  . Coronary artery disease Father 77       CABG  . Stroke Father 57  . Multiple myeloma Father 7  . Diabetes Mother   . Dementia Mother        mother also had- PAD, psychosis  . Hypertension Mother   . Cancer Neg Hx        colon, breast   Social History   Socioeconomic History  . Marital status: Widowed    Spouse name: Not on file  . Number of children: 1  . Years of education: Not on file  .  Highest education level: Not on file  Occupational History  . 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 Financial resource strain: Not on file  . Food insecurity:    Worry: Not on file    Inability: Not on file  . Transportation needs:    Medical: Not on file    Non-medical: Not on file  Tobacco Use  . Smoking status: Former Research scientist (life sciences)  . Smokeless tobacco: Never Used  . Tobacco comment: never heavy use , quit in the 80s  Substance and Sexual Activity  . Alcohol use: Yes    Alcohol/week: 0.0 oz    Comment: socially  . Drug use: No  . Sexual activity: Never  Lifestyle  . Physical activity:    Days per week: Not on file    Minutes per session: Not on file  . Stress: Not on file  Relationships  . Social connections:    Talks on phone: Not on file    Gets together: Not on file    Attends religious service: Not on file  Active member of club or organization: Not on file    Attends meetings of clubs or organizations: Not on file    Relationship status: Not on file  Other Topics Concern  . Not on file  Social History Narrative   Lives by herself, widow (1990), 1 daughter social worker [place], [place])  , 1 [place] mother 2014          Outpatient Encounter Medications as of 03/01/2018  Medication Sig  . ALPRAZolam (XANAX) 0.5 MG tablet TAKE 1 TAB BY MOUTH 3 TIMES DAILY AS NEEDED FOR ANXIETY & 2 TABS AT BEDTIME IF NEEDED FOR SLEEP  . aspirin 81 MG tablet Take 81 mg by mouth daily.    Marland Kitchen atorvastatin (LIPITOR) 20 MG tablet Take 1 tablet (20 mg total) by mouth at bedtime.  Marland Kitchen azelastine (ASTELIN) 0.1 % nasal spray Place 2 sprays into both nostrils at bedtime as needed for rhinitis or allergies. Use in each nostril as directed  . Blood Glucose Monitoring Suppl (ONE TOUCH ULTRA 2) W/DEVICE KIT Use once daily to check blood sugar  . Cetirizine HCl (ZYRTEC PO) Take 1 tablet by mouth daily as needed.  .  DiphenhydrAMINE HCl (BENADRYL PO) Take by mouth as needed. Reported on 02/14/2016  . ibuprofen (ADVIL,MOTRIN) 200 MG tablet Take 200 mg by mouth every 6 (six) hours as needed.    Marland Kitchen JANUMET XR 50-500 MG TB24 Take 1 tablet by mouth daily.  . ONE TOUCH ULTRA TEST test strip USE 1 STRIP DAILY AS DIRECTED.  Marland Kitchen ONETOUCH DELICA LANCETS 26E MISC CHECK BLOOD SUGAR NO MORE THAN TWICE DAILY  . promethazine-codeine (PHENERGAN WITH CODEINE) 6.25-10 MG/5ML syrup Take 5 mLs by mouth every 8 (eight) hours as needed for cough. (Patient not taking: Reported on 11/21/2016)  . Triamcinolone Acetonide (NASACORT AQ NA) Place into the nose. Daily  . Vitamin D, Cholecalciferol, 1000 units TABS Take 400 mg by mouth daily.   No facility-administered encounter medications on file as of 03/01/2018.     Activities of Daily Living In your present state of health, do you have any difficulty performing the following activities: 02/27/2017  Hearing? N  Vision? N  Difficulty concentrating or making decisions? N  Walking or climbing stairs? N  Dressing or bathing? N  Doing errands, shopping? N  Preparing Food and eating ? N  Using the Toilet? N  In the past six months, have you accidently leaked urine? N  Do you have problems with loss of bowel control? N  Managing your Medications? N  Managing your Finances? N  Housekeeping or managing your Housekeeping? N  Some recent data might be hidden    Patient Care Team: Colon Branch, MD as PCP - General Jacelyn Pi, MD as Consulting Physician (Endocrinology) Dene Gentry, MD as Consulting Physician (Sports Medicine) Renato Shin, MD as Consulting Physician (Endocrinology) Allyn Kenner, DO as Consulting Physician (Obstetrics and Gynecology)    Assessment:   This is a routine wellness examination for Nicole Duran. No ROS.  Medicare Wellness Visit. Additional risk factors are reflected in the social history.  Exercise Activities and Dietary recommendations   Diet  (meal preparation, eat out, water intake, caffeinated beverages, dairy products, fruits and vegetables): {Desc; diets:16563} Breakfast: Lunch:  Dinner:      Goals    None      Fall Risk Fall Risk  02/27/2017 02/27/2017 02/14/2016 07/23/2015 12/21/2014  Falls in the past year? _0   Depression Screen PHQ 2/9 Scores 02/27/2017 02/14/2016 07/23/2015 12/21/2014  PHQ - 2 Score 0 0 0 0     Cognitive Function        Immunization History  Administered Date(s) Administered  . Influenza,inj,Quad PF,6+ Mos 06/05/2017  . Influenza-Unspecified 06/02/2011, 06/01/2013, 06/01/2014, 06/02/2015, 06/01/2016  . Pneumococcal Conjugate-13 12/21/2014  . Pneumococcal Polysaccharide-23 08/22/2013  . Td 09/02/2003  . Tetanus 01/03/2014  . Zoster 05/12/2012    Screening Tests Health Maintenance  Topic Date Due  . FOOT EXAM  02/13/2017  . OPHTHALMOLOGY EXAM  06/05/2017  . HEMOGLOBIN A1C  02/01/2018  . COLONOSCOPY  09/07/2018 (Originally 12/07/1998)  . INFLUENZA VACCINE  04/01/2018  . PNA vac Low Risk Adult (2 of 2 - PPSV23) 08/22/2018  . MAMMOGRAM  12/09/2018  . TETANUS/TDAP  01/04/2024  . DEXA SCAN  Completed  . Hepatitis C Screening  Completed       Plan:   ***   I have personally reviewed and noted the following in the patient's chart:   . Medical and social history . Use of alcohol, tobacco or illicit drugs  . Current medications and supplements . Functional ability and status . Nutritional status . Physical activity . Advanced directives . List of other physicians . Hospitalizations, surgeries, and ER visits in previous 12 months . Vitals . Screenings to include cognitive, depression, and falls . Referrals and appointments  In addition, I have reviewed and discussed with patient certain preventive protocols, quality metrics, and best practice recommendations. A written personalized care plan for preventive services as well as general preventive health  recommendations were provided to patient.     Shela Nevin, [place]  02/24/2018

## 2018-03-01 ENCOUNTER — Ambulatory Visit: Payer: Medicare Other | Admitting: *Deleted

## 2018-03-10 ENCOUNTER — Telehealth: Payer: Self-pay | Admitting: Internal Medicine

## 2018-03-10 NOTE — Telephone Encounter (Signed)
Sent!

## 2018-03-10 NOTE — Telephone Encounter (Signed)
Pt is requesting refill on alprazolam.   Last OV: 09/07/2017 Last Fill: 10/30/2017 #100 and 0RF UDS: 09/07/2017 Moderate risk  NCCR printed- no discrepancies noted- sent for scanning  Please advise.

## 2018-03-25 ENCOUNTER — Encounter: Payer: Self-pay | Admitting: Internal Medicine

## 2018-03-29 ENCOUNTER — Ambulatory Visit: Payer: Medicare Other | Admitting: Registered"

## 2018-04-19 ENCOUNTER — Encounter: Payer: Medicare Other | Attending: Endocrinology | Admitting: Registered"

## 2018-04-19 ENCOUNTER — Encounter: Payer: Self-pay | Admitting: Registered"

## 2018-04-19 DIAGNOSIS — E118 Type 2 diabetes mellitus with unspecified complications: Secondary | ICD-10-CM

## 2018-04-19 DIAGNOSIS — Z713 Dietary counseling and surveillance: Secondary | ICD-10-CM | POA: Diagnosis not present

## 2018-04-19 DIAGNOSIS — E119 Type 2 diabetes mellitus without complications: Secondary | ICD-10-CM | POA: Diagnosis not present

## 2018-04-19 NOTE — Progress Notes (Signed)
Diabetes Self-Management Education  Visit Type: First/Initial  Appt. Start Time: 1400 Appt. End Time: 7253  04/20/2018  [name], identified by name and date of birth, is a 67 y.o. female with a diagnosis of Diabetes: Type 2.   ASSESSMENT Patient states that when her A1c started increasing she enrolled in a 6 month trial for new DM medication (March-Oct 2018) and experienced reduction in A1c and weight loss. Pt states Dr. Chalmers Cater then started her on Jardiance which was also effective in bringing down A1c ~6.7%. Pt states she asked for medication that also reduces appetite and has a prescription for Xiguduo but is waiting to use up 3 month supply of Jardiance before switching medication. Pt ultimate goal is to reduce medication to just metformin and maintain a low A1c. Pt states she checks FBG 2-3 week 115-160 mg/dL and would like it to stay closer to the 115 range.  Pt states she is interested in portion control. Pt states she is a fast eater, not much of a snacker inbetween meals, but does like to "nibble" starting around 8 pm, dinner usually at 5 pm. Pt states she eats while watching TV. Pt states she lives alone and doesn't like to eat at the table by herself.   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 Pt states she sleeps ~11:30-7:30 getting up several times to use the bathroom.  Pt states blood pressure varies depending on which office is taking it. Pt states her PCP usually gets a reading ~120/ and pt states her PCP explained the reason Dr Almetta Lovely office and her OB-gyn are closer to 140/ is because they are using the wrong size cuff. If patient does have HTN, RD may consider taking a closer look at amount of sodium in her diet, it is probably higher than what pt thinks but may not be a problem if she does not have HTN. (just 2 items from dietary recall add up to over 1500  mg: 1 c cottage cheese 819 mg & Danton Clap delight 730 mg)  Diabetes Self-Management Education - 04/19/18 1424      Visit Information   Visit Type  First/Initial      Initial Visit   Diabetes Type  Type 2    Are you currently following a meal plan?  No    Are you taking your medications as prescribed?  Yes    Date Diagnosed  5-7 yrs ago      Health Coping   How would you rate your overall health?  Good      Psychosocial Assessment   Patient Belief/Attitude about Diabetes  Motivated to manage diabetes    How often do you need to have someone help you when you read instructions, pamphlets, or other written materials from your doctor or pharmacy?  1 - Never    What is the last grade level you completed in school?  business school      Complications   Last HgB A1C per patient/outside source  6.6 %    How often do you check your blood sugar?  0 times/day (not testing)    Fasting Blood glucose range (mg/dL)  70-129;130-179   115-160   Number of hypoglycemic episodes per month  0    Number of hyperglycemic episodes per week  0    Have you had a dilated eye exam in the past 12 months?  Yes    Have you had a dental exam in the past 12 months?  Yes    Are you checking your feet?  No      Dietary Intake   Breakfast  Risk manager, [place] sausage, egg white, cheese OR oatmeal, blue berries, margarine OR cheerios, blueberries, almond milk unsweetened    Snack (morning)  none    Lunch  1 c cottage cheese, fruit, crackers, tomatoes,  OR pimento cheese or egg salad sandwich    Snack (afternoon)  none    Dinner  chicken, vegetable OR chicken salad OR stoufers OR big hamburger, bacon, cheese, salad,    Snack (evening)  string cheese, yogurt, ice cream    Beverage(s)  coffee, non-nutritive sweetener, sugar free creamer, water      Exercise   Exercise Type  Moderate (swimming / aerobic walking)    How many days per week to you exercise?  4    How many minutes per day do you exercise?   75    Total minutes per week of exercise  300      Patient Education   Disease state   Definition of diabetes, type 1 and 2, and the diagnosis of diabetes;Factors that contribute to the development of diabetes    Nutrition management   Role of diet in the treatment of diabetes and the relationship between the three main macronutrients and blood glucose level    Physical activity and exercise   Role of exercise on diabetes management, blood pressure control and cardiac health.    Monitoring  Identified appropriate SMBG and/or A1C goals.    Psychosocial adjustment  Role of stress on diabetes      Individualized Goals (developed by patient)   Nutrition  General guidelines for healthy choices and portions discussed    Physical Activity  Exercise 3-5 times per week      Outcomes   Expected Outcomes  Demonstrated interest in learning. Expect positive outcomes    Future DMSE  PRN    Program Status  Completed     Individualized Plan for Diabetes Self-Management Training:   Learning Objective:  Patient will have a greater understanding of diabetes self-management. Patient education plan is to attend individual and/or group sessions per assessed needs and concerns.   Patient Instructions  Portion control: pay attention while eating, slow down- put fork down in between bites. (Mindful eating) Aim to eat balanced meals and snacks Continue with your great activity Consider drinking less tea at night, may help your sleep  Expected Outcomes:  Demonstrated interest in learning. Expect positive outcomes  Education material provided: ADA Diabetes: Your Take Control Guide, A1C conversion sheet, Snack sheet and Carbohydrate counting sheet  If problems or questions, patient to contact team via:  Phone  Future DSME appointment: PRN

## 2018-04-19 NOTE — Patient Instructions (Addendum)
Portion control: pay attention while eating, slow down- put fork down in between bites. (Mindful eating) Aim to eat balanced meals and snacks Continue with your great activity Consider drinking less tea at night, may help your sleep

## 2018-04-22 DIAGNOSIS — L570 Actinic keratosis: Secondary | ICD-10-CM | POA: Diagnosis not present

## 2018-04-22 DIAGNOSIS — Z85828 Personal history of other malignant neoplasm of skin: Secondary | ICD-10-CM | POA: Diagnosis not present

## 2018-04-22 DIAGNOSIS — L814 Other melanin hyperpigmentation: Secondary | ICD-10-CM | POA: Diagnosis not present

## 2018-04-22 DIAGNOSIS — L821 Other seborrheic keratosis: Secondary | ICD-10-CM | POA: Diagnosis not present

## 2018-04-22 DIAGNOSIS — D1801 Hemangioma of skin and subcutaneous tissue: Secondary | ICD-10-CM | POA: Diagnosis not present

## 2018-04-22 DIAGNOSIS — D2239 Melanocytic nevi of other parts of face: Secondary | ICD-10-CM | POA: Diagnosis not present

## 2018-04-22 DIAGNOSIS — L820 Inflamed seborrheic keratosis: Secondary | ICD-10-CM | POA: Diagnosis not present

## 2018-05-10 ENCOUNTER — Ambulatory Visit: Payer: Medicare Other | Admitting: Internal Medicine

## 2018-05-17 NOTE — Progress Notes (Addendum)
Subjective:   Nicole Duran is a 67 y.o. female who presents for Medicare Annual (Subsequent) preventive examination.  Review of Systems: No ROS.  Medicare Wellness Visit. Additional risk factors are reflected in the social history. Cardiac Risk Factors include: advanced age (>48mn, >>3women);diabetes mellitus;dyslipidemia;obesity (BMI >30kg/m2) Sleep patterns: Takes Xanax for sleep as needed. Sleeps well in general Home Safety/Smoke Alarms: Feels safe in home. Smoke alarms in place. Lives alone. No stairs.  Female:   Pap-  12/08/17 per pt     Mammo- utd Dexa scan-   utd     CCS-declines Eye- pt reports last yearly eye exam 10/2017 w/ Dr.Shapiro.    Objective:     Vitals: There were no vitals taken for this visit.  There is no height or weight on file to calculate BMI.  Advanced Directives 05/18/2018 04/19/2018 02/27/2017  Does Patient Have a Medical Advance Directive? No Yes No  Does patient want to make changes to medical advance directive? Yes (MAU/Ambulatory/Procedural Areas - Information given) No - Patient declined -  Would patient like information on creating a medical advance directive? - - Yes (MAU/Ambulatory/Procedural Areas - Information given)    Tobacco Social History   Tobacco Use  Smoking Status Former Smoker  Smokeless Tobacco Never Used  Tobacco Comment   never heavy use , quit in the 80s     Counseling given: Not Answered Comment: never heavy use , quit in the 80s   Clinical Intake:     Pain : No/denies pain                 Past Medical History:  Diagnosis Date  . ALLERGIC RHINITIS 04/22/2007  . Anxiety   . 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 HYPERLIPIDEMIA 04/22/2007  . Insomnia   . Kidney stone   . Menopause   . Metabolic syndrome    Past Surgical History:  Procedure Laterality Date  . cataracts Bilateral 06/15 11/15  . CHOLECYSTECTOMY     Family History  Problem Relation Age of Onset  . Coronary artery disease Father 649      CABG  .  Stroke Father 713 . Multiple myeloma Father 728 . Diabetes Mother   . Dementia Mother        mother also had- PAD, psychosis  . Hypertension Mother   . Cancer Neg Hx        colon, breast   Social History   Socioeconomic History  . Marital status: Widowed    Spouse name: Not on file  . Number of children: 1  . Years of education: Not on file  . Highest education level: Not on file  Occupational History  . 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 Financial resource strain: Not on file  . Food insecurity:    Worry: Not on file    Inability: Not on file  . Transportation needs:    Medical: Not on file    Non-medical: Not on file  Tobacco Use  . Smoking status: Former SResearch scientist (life sciences) . Smokeless tobacco: Never Used  . Tobacco comment: never heavy use , quit in the 80s  Substance and Sexual Activity  . Alcohol use: Yes    Alcohol/week: 0.0 standard drinks    Comment: socially  . Drug use: No  . Sexual activity: Never  Lifestyle  . Physical activity:    Days per week: Not on file  Minutes per session: Not on file  . Stress: Not on file  Relationships  . Social connections:    Talks on phone: Not on file    Gets together: Not on file    Attends religious service: Not on file    Active member of club or organization: Not on file    Attends meetings of clubs or organizations: Not on file    Relationship status: Not on file  Other Topics Concern  . Not on file  Social History Narrative   Lives by herself, widow (1990), 1 daughter social worker [place], [place])  , 1 [place] mother 2014          Outpatient Encounter Medications as of 05/18/2018  Medication Sig  . ALPRAZolam (XANAX) 0.5 MG tablet TAKE 1 TABLET BY MOUTH 3 TIMES A DAY AS NEEDED FOR ANXIETY AND 2 AT BEDTIME IF NEEDED FOR SLEEP  . atorvastatin (LIPITOR) 20 MG tablet Take 1 tablet (20 mg total) by mouth at bedtime.  Marland Kitchen azelastine (ASTELIN)  0.1 % nasal spray Place 2 sprays into both nostrils at bedtime as needed for rhinitis or allergies. Use in each nostril as directed  . Blood Glucose Monitoring Suppl (ONE TOUCH ULTRA 2) W/DEVICE KIT Use once daily to check blood sugar  . Cetirizine HCl (ZYRTEC PO) Take 1 tablet by mouth daily as needed.  . DiphenhydrAMINE HCl (BENADRYL PO) Take by mouth as needed. Reported on 02/14/2016  . ibuprofen (ADVIL,MOTRIN) 200 MG tablet Take 200 mg by mouth every 6 (six) hours as needed.    Marland Kitchen JANUMET XR 50-500 MG TB24 Take 1 tablet by mouth daily.  . ONE TOUCH ULTRA TEST test strip USE 1 STRIP DAILY AS DIRECTED.  Marland Kitchen ONETOUCH DELICA LANCETS 64Q MISC CHECK BLOOD SUGAR NO MORE THAN TWICE DAILY  . promethazine-codeine (PHENERGAN WITH CODEINE) 6.25-10 MG/5ML syrup Take 5 mLs by mouth every 8 (eight) hours as needed for cough.  . Triamcinolone Acetonide (NASACORT AQ NA) Place into the nose. Daily  . Vitamin D, Cholecalciferol, 1000 units TABS Take 400 mg by mouth daily.  Marland Kitchen aspirin 81 MG tablet Take 81 mg by mouth daily.     No facility-administered encounter medications on file as of 05/18/2018.     Activities of Daily Living In your present state of health, do you have any difficulty performing the following activities: 05/18/2018  Hearing? N  Vision? N  Difficulty concentrating or making decisions? N  Walking or climbing stairs? N  Dressing or bathing? N  Doing errands, shopping? N  Preparing Food and eating ? N  Using the Toilet? N  In the past six months, have you accidently leaked urine? N  Do you have problems with loss of bowel control? N  Managing your Medications? N  Managing your Finances? N  Housekeeping or managing your Housekeeping? N  Some recent data might be hidden    Patient Care Team: Colon Branch, MD as PCP - General Jacelyn Pi, MD as Consulting Physician (Endocrinology) Dene Gentry, MD as Consulting Physician (Sports Medicine) Renato Shin, MD as Consulting Physician  (Endocrinology) Allyn Kenner, DO as Consulting Physician (Obstetrics and Gynecology)    Assessment:   This is a routine wellness examination for Nicole Duran. Physical assessment deferred to PCP.  Exercise Activities and Dietary recommendations Current Exercise Habits: Structured exercise class, Type of exercise: treadmill;strength training/weights, Time (Minutes): > 60, Frequency (Times/Week): 3, Weekly Exercise (Minutes/Week): 0, Intensity: Moderate, Exercise limited by: None identified Diet (  meal preparation, eat out, water intake, caffeinated beverages, dairy products, fruits and vegetables): well balanced   Goals    . Maintain healthy lifestyle (pt-stated)       Fall Risk Fall Risk  05/18/2018 04/19/2018 02/27/2017 02/27/2017 02/14/2016  Falls in the past year? No No No No No   Depression Screen PHQ 2/9 Scores 05/18/2018 04/19/2018 04/19/2018 02/27/2017  PHQ - 2 Score 0 0 0 0     Cognitive Function Ad8 score reviewed for issues:  Issues making decisions:no  Less interest in hobbies / activities:no  Repeats questions, stories (family complaining):no  Trouble using ordinary gadgets (microwave, computer, phone):no  Forgets the month or year: no  Mismanaging finances: no  Remembering appts:no  Daily problems with thinking and/or memory:no Ad8 score is=0       Immunization History  Administered Date(s) Administered  . Influenza,inj,Quad PF,6+ Mos 06/05/2017  . Influenza-Unspecified 06/02/2011, 06/01/2013, 06/01/2014, 06/02/2015, 06/01/2016  . Pneumococcal Conjugate-13 12/21/2014  . Pneumococcal Polysaccharide-23 08/22/2013  . Td 09/02/2003  . Tetanus 01/03/2014  . Zoster 05/12/2012    Screening Tests Health Maintenance  Topic Date Due  . HEMOGLOBIN A1C  02/01/2018  . INFLUENZA VACCINE  04/01/2018  . COLONOSCOPY  09/07/2018 (Originally 12/07/1998)  . PNA vac Low Risk Adult (2 of 2 - PPSV23) 08/22/2018  . OPHTHALMOLOGY EXAM  10/06/2018  . MAMMOGRAM  12/09/2018  .  FOOT EXAM  01/31/2019  . TETANUS/TDAP  01/04/2024  . DEXA SCAN  Completed  . Hepatitis C Screening  Completed      Plan:    Please schedule your next medicare wellness visit with me in 1 yr.  Follow up with Dr.Paz as scheduled.  Continue to eat heart healthy diet (full of fruits, vegetables, whole grains, lean protein, water--limit salt, fat, and sugar intake) and increase physical activity as tolerated.  Bring a copy of your living will and/or healthcare power of attorney to your next office visit.    I have personally reviewed and noted the following in the patient's chart:   . Medical and social history . Use of alcohol, tobacco or illicit drugs  . Current medications and supplements . Functional ability and status . Nutritional status . Physical activity . Advanced directives . List of other physicians . Hospitalizations, surgeries, and ER visits in previous 12 months . Vitals . Screenings to include cognitive, depression, and falls . Referrals and appointments  In addition, I have reviewed and discussed with patient certain preventive protocols, quality metrics, and best practice recommendations. A written personalized care plan for preventive services as well as general preventive health recommendations were provided to patient.     Naaman Plummer [place], [place]  05/18/2018  Kathlene November, MD

## 2018-05-18 ENCOUNTER — Encounter: Payer: Self-pay | Admitting: *Deleted

## 2018-05-18 ENCOUNTER — Ambulatory Visit (INDEPENDENT_AMBULATORY_CARE_PROVIDER_SITE_OTHER): Payer: Medicare Other | Admitting: *Deleted

## 2018-05-18 VITALS — BP 142/70 | HR 66 | Ht 66.0 in | Wt 186.4 lb

## 2018-05-18 DIAGNOSIS — Z0000 Encounter for general adult medical examination without abnormal findings: Secondary | ICD-10-CM | POA: Diagnosis not present

## 2018-05-18 NOTE — Patient Instructions (Signed)
Please schedule your next medicare wellness visit with me in 1 yr.   Follow up with Dr.Paz as scheduled.  Continue to eat heart healthy diet (full of fruits, vegetables, whole grains, lean protein, water--limit salt, fat, and sugar intake) and increase physical activity as tolerated.  Bring a copy of your living will and/or healthcare power of attorney to your next office visit.   [name] , Thank you for taking time to come for your Medicare Wellness Visit. I appreciate your ongoing commitment to your health goals. Please review the following plan we discussed and let me know if I can assist you in the future.   These are the goals we discussed: Goals    . Maintain healthy lifestyle (pt-stated)       This is a list of the screening recommended for you and due dates:  Health Maintenance  Topic Date Due  . Hemoglobin A1C  02/01/2018  . Flu Shot  04/01/2018  . Colon Cancer Screening  09/07/2018*  . Pneumonia vaccines (2 of 2 - PPSV23) 08/22/2018  . Eye exam for diabetics  10/06/2018  . Mammogram  12/09/2018  . Complete foot exam   01/31/2019  . Tetanus Vaccine  01/04/2024  . DEXA scan (bone density measurement)  Completed  .  Hepatitis C: One time screening is recommended by Center for Disease Control  (CDC) for  adults born from [birth year] through 1965.   Completed  *Topic was postponed. The date shown is not the original due date.    Health Maintenance for Postmenopausal Women Menopause is a normal process in which your reproductive ability comes to an end. This process happens gradually over a span of months to years, usually between the ages of 80 and 64. Menopause is complete when you have missed 12 consecutive menstrual periods. It is important to talk with your health care provider about some of the most common conditions that affect postmenopausal women, such as heart disease, cancer, and bone loss (osteoporosis). Adopting a healthy lifestyle and getting preventive care can  help to promote your health and wellness. Those actions can also lower your chances of developing some of these common conditions. What should I know about menopause? During menopause, you may experience a number of symptoms, such as:  Moderate-to-severe hot flashes.  Night sweats.  Decrease in sex drive.  Mood swings.  Headaches.  Tiredness.  Irritability.  Memory problems.  Insomnia.  Choosing to treat or not to treat menopausal changes is an individual decision that you make with your health care provider. What should I know about hormone replacement therapy and supplements? Hormone therapy products are effective for treating symptoms that are associated with menopause, such as hot flashes and night sweats. Hormone replacement carries certain risks, especially as you become older. If you are thinking about using estrogen or estrogen with progestin treatments, discuss the benefits and risks with your health care provider. What should I know about heart disease and stroke? Heart disease, heart attack, and stroke become more likely as you age. This may be due, in part, to the hormonal changes that your body experiences during menopause. These can affect how your body processes dietary fats, triglycerides, and cholesterol. Heart attack and stroke are both medical emergencies. There are many things that you can do to help prevent heart disease and stroke:  Have your blood pressure checked at least every 1-2 years. High blood pressure causes heart disease and increases the risk of stroke.  If you are 55-79 years  old, ask your health care provider if you should take aspirin to prevent a heart attack or a stroke.  Do not use any tobacco products, including cigarettes, chewing tobacco, or electronic cigarettes. If you need help quitting, ask your health care provider.  It is important to eat a healthy diet and maintain a healthy weight. ? Be sure to include plenty of vegetables, fruits,  low-fat dairy products, and lean protein. ? Avoid eating foods that are high in solid fats, added sugars, or salt (sodium).  Get regular exercise. This is one of the most important things that you can do for your health. ? Try to exercise for at least 150 minutes each week. The type of exercise that you do should increase your heart rate and make you sweat. This is known as moderate-intensity exercise. ? Try to do strengthening exercises at least twice each week. Do these in addition to the moderate-intensity exercise.  Know your numbers.Ask your health care provider to check your cholesterol and your blood glucose. Continue to have your blood tested as directed by your health care provider.  What should I know about cancer screening? There are several types of cancer. Take the following steps to reduce your risk and to catch any cancer development as early as possible. Breast Cancer  Practice breast self-awareness. ? This means understanding how your breasts normally appear and feel. ? It also means doing regular breast self-exams. Let your health care provider know about any changes, no matter how small.  If you are 66 or older, have a clinician do a breast exam (clinical breast exam or CBE) every year. Depending on your age, family history, and medical history, it may be recommended that you also have a yearly breast X-ray (mammogram).  If you have a family history of breast cancer, talk with your health care provider about genetic screening.  If you are at high risk for breast cancer, talk with your health care provider about having an MRI and a mammogram every year.  Breast cancer (BRCA) gene test is recommended for women who have family members with BRCA-related cancers. Results of the assessment will determine the need for genetic counseling and BRCA1 and for BRCA2 testing. BRCA-related cancers include these types: ? Breast. This occurs in males or females. ? Ovarian. ? Tubal. This  may also be called fallopian tube cancer. ? Cancer of the abdominal or pelvic lining (peritoneal cancer). ? Prostate. ? Pancreatic.  Cervical, Uterine, and Ovarian Cancer Your health care provider may recommend that you be screened regularly for cancer of the pelvic organs. These include your ovaries, uterus, and vagina. This screening involves a pelvic exam, which includes checking for microscopic changes to the surface of your cervix (Pap test).  For women ages 21-65, health care providers may recommend a pelvic exam and a Pap test every three years. For women ages 70-65, they may recommend the Pap test and pelvic exam, combined with testing for human papilloma virus (HPV), every five years. Some types of HPV increase your risk of cervical cancer. Testing for HPV may also be done on women of any age who have unclear Pap test results.  Other health care providers may not recommend any screening for nonpregnant women who are considered low risk for pelvic cancer and have no symptoms. Ask your health care provider if a screening pelvic exam is right for you.  If you have had past treatment for cervical cancer or a condition that could lead to cancer, you need  Pap tests and screening for cancer for at least 20 years after your treatment. If Pap tests have been discontinued for you, your risk factors (such as having a new sexual partner) need to be reassessed to determine if you should start having screenings again. Some women have medical problems that increase the chance of getting cervical cancer. In these cases, your health care provider may recommend that you have screening and Pap tests more often.  If you have a family history of uterine cancer or ovarian cancer, talk with your health care provider about genetic screening.  If you have vaginal bleeding after reaching menopause, tell your health care provider.  There are currently no reliable tests available to screen for ovarian cancer.  Lung  Cancer Lung cancer screening is recommended for adults 70-41 years old who are at high risk for lung cancer because of a history of smoking. A yearly low-dose CT scan of the lungs is recommended if you:  Currently smoke.  Have a history of at least 30 pack-years of smoking and you currently smoke or have quit within the past 15 years. A pack-year is smoking an average of one pack of cigarettes per day for one year.  Yearly screening should:  Continue until it has been 15 years since you quit.  Stop if you develop a health problem that would prevent you from having lung cancer treatment.  Colorectal Cancer  This type of cancer can be detected and can often be prevented.  Routine colorectal cancer screening usually begins at age 34 and continues through age 62.  If you have risk factors for colon cancer, your health care provider may recommend that you be screened at an earlier age.  If you have a family history of colorectal cancer, talk with your health care provider about genetic screening.  Your health care provider may also recommend using home test kits to check for hidden blood in your stool.  A small camera at the end of a tube can be used to examine your colon directly (sigmoidoscopy or colonoscopy). This is done to check for the earliest forms of colorectal cancer.  Direct examination of the colon should be repeated every 5-10 years until age 22. However, if early forms of precancerous polyps or small growths are found or if you have a family history or genetic risk for colorectal cancer, you may need to be screened more often.  Skin Cancer  Check your skin from head to toe regularly.  Monitor any moles. Be sure to tell your health care provider: ? About any new moles or changes in moles, especially if there is a change in a mole's shape or color. ? If you have a mole that is larger than the size of a pencil eraser.  If any of your family members has a history of skin  cancer, especially at a young age, talk with your health care provider about genetic screening.  Always use sunscreen. Apply sunscreen liberally and repeatedly throughout the day.  Whenever you are outside, protect yourself by wearing long sleeves, pants, a wide-brimmed hat, and sunglasses.  What should I know about osteoporosis? Osteoporosis is a condition in which bone destruction happens more quickly than new bone creation. After menopause, you may be at an increased risk for osteoporosis. To help prevent osteoporosis or the bone fractures that can happen because of osteoporosis, the following is recommended:  If you are 51-59 years old, get at least 1,000 mg of calcium and at least 600  mg of vitamin D per day.  If you are older than age 47 but younger than age 58, get at least 1,200 mg of calcium and at least 600 mg of vitamin D per day.  If you are older than age 64, get at least 1,200 mg of calcium and at least 800 mg of vitamin D per day.  Smoking and excessive alcohol intake increase the risk of osteoporosis. Eat foods that are rich in calcium and vitamin D, and do weight-bearing exercises several times each week as directed by your health care provider. What should I know about how menopause affects my mental health? Depression may occur at any age, but it is more common as you become older. Common symptoms of depression include:  Low or sad mood.  Changes in sleep patterns.  Changes in appetite or eating patterns.  Feeling an overall lack of motivation or enjoyment of activities that you previously enjoyed.  Frequent crying spells.  Talk with your health care provider if you think that you are experiencing depression. What should I know about immunizations? It is important that you get and maintain your immunizations. These include:  Tetanus, diphtheria, and pertussis (Tdap) booster vaccine.  Influenza every year before the flu season begins.  Pneumonia  vaccine.  Shingles vaccine.  Your health care provider may also recommend other immunizations. This information is not intended to replace advice given to you by your health care provider. Make sure you discuss any questions you have with your health care provider. Document Released: 10/10/2005 Document Revised: 03/07/2016 Document Reviewed: 05/22/2015 Elsevier Interactive Patient Education  2018 [employer].

## 2018-06-03 ENCOUNTER — Other Ambulatory Visit: Payer: Self-pay | Admitting: Internal Medicine

## 2018-06-07 ENCOUNTER — Encounter: Payer: Self-pay | Admitting: Internal Medicine

## 2018-06-07 ENCOUNTER — Ambulatory Visit (INDEPENDENT_AMBULATORY_CARE_PROVIDER_SITE_OTHER): Payer: Medicare Other | Admitting: Internal Medicine

## 2018-06-07 VITALS — BP 128/64 | HR 72 | Temp 97.8°F | Resp 16 | Ht 66.0 in | Wt 182.0 lb

## 2018-06-07 DIAGNOSIS — Z79899 Other long term (current) drug therapy: Secondary | ICD-10-CM

## 2018-06-07 DIAGNOSIS — I350 Nonrheumatic aortic (valve) stenosis: Secondary | ICD-10-CM | POA: Diagnosis not present

## 2018-06-07 DIAGNOSIS — E785 Hyperlipidemia, unspecified: Secondary | ICD-10-CM | POA: Diagnosis not present

## 2018-06-07 DIAGNOSIS — M25561 Pain in right knee: Secondary | ICD-10-CM

## 2018-06-07 DIAGNOSIS — M25562 Pain in left knee: Secondary | ICD-10-CM

## 2018-06-07 DIAGNOSIS — F411 Generalized anxiety disorder: Secondary | ICD-10-CM | POA: Diagnosis not present

## 2018-06-07 DIAGNOSIS — Z23 Encounter for immunization: Secondary | ICD-10-CM

## 2018-06-07 MED ORDER — ZOSTER VAC RECOMB ADJUVANTED 50 MCG/0.5ML IM SUSR: 0.5000 mL | Freq: Once | INTRAMUSCULAR | 1 refills | Status: AC

## 2018-06-07 NOTE — Assessment & Plan Note (Signed)
Labs 02/15/2018: Creatinine 0.9, sodium 142, potassium 4.9, calcium 9.6, DM: Per endocrine Hyperlipidemia:Currently on Lipitor, will check a FLP, AST, ALT Anxiety, insomnia: On Xanax, due for a UDS, occ takes a codeine syrup for cough Mild aortic stenosis per echo 2015: Has a mild murmur, no symptoms, consider redo a echo next year. Knee pain: Mild, exam consistent with DJD, she is very active.  Recommend observation for now, continue w/ occasional ibuprofen as she is doing. Preventive care:  [facility] gynecology April 19, had a, mammogram. Shingrix prescription provided, pros and cons discussed.Flu shot today RTC 6 months

## 2018-06-07 NOTE — Progress Notes (Signed)
Pre visit review using our clinic review tool, if applicable. No additional management support is needed unless otherwise documented below in the visit note. 

## 2018-06-07 NOTE — Patient Instructions (Addendum)
GO TO THE LAB : Get the blood work     GO TO THE FRONT DESK Schedule your next appointment for a  Check up in 6 months   

## 2018-06-07 NOTE — Progress Notes (Signed)
Subjective:    Patient ID: Nicole Duran, female    DOB: April 14, 1951, 67 y.o.   MRN: [id number]  DOS:  06/07/2018 Type of visit - description : rov Interval history: In general feels well. -Goes to the [facility] 3 times a week, has bilateral knee pain on and off.  Denies any swelling or redness. -History of mild aortic stenosis per echo.  No symptoms. -On Xanax, due for a UDS -Saw her gynecologist, note reviewed -high chol, due for labs   Review of Systems  Denies chest pain or difficulty breathing.  No lower extremity edema No nausea, vomiting, diarrhea  Past Medical History:  Diagnosis Date  . ALLERGIC RHINITIS 04/22/2007  . Anxiety   . 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 HYPERLIPIDEMIA 04/22/2007  . Insomnia   . Kidney stone   . Menopause   . Metabolic syndrome     Past Surgical History:  Procedure Laterality Date  . cataracts Bilateral 06/15 11/15  . CHOLECYSTECTOMY      Social History   Socioeconomic History  . Marital status: Widowed    Spouse name: Not on file  . Number of children: 1  . Years of education: Not on file  . Highest education level: Not on file  Occupational History  . 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 Financial resource strain: Not on file  . Food insecurity:    Worry: Not on file    Inability: Not on file  . Transportation needs:    Medical: Not on file    Non-medical: Not on file  Tobacco Use  . Smoking status: Former Research scientist (life sciences)  . Smokeless tobacco: Never Used  . Tobacco comment: never heavy use , quit in the 80s  Substance and Sexual Activity  . Alcohol use: Yes    Alcohol/week: 0.0 standard drinks    Comment: socially  . Drug use: No  . Sexual activity: Not Currently  Lifestyle  . Physical activity:    Days per week: Not on file    Minutes per session: Not on file  . Stress: Not on file  Relationships  . Social connections:    Talks on phone: Not on file    Gets  together: Not on file    Attends religious service: Not on file    Active member of club or organization: Not on file    Attends meetings of clubs or organizations: Not on file    Relationship status: Not on file  . Intimate partner violence:    Fear of current or ex partner: Not on file    Emotionally abused: Not on file    Physically abused: Not on file    Forced sexual activity: Not on file  Other Topics Concern  . Not on file  Social History Narrative   Lives by herself, widow (1990), 1 daughter social worker ([place], [place])  , 1 [place] mother 2014            Allergies as of 06/07/2018      Reactions   Cefuroxime Axetil Hives, Shortness Of Breath, Itching   Ceftin   Amoxicillin-pot Clavulanate Nausea And Vomiting   Ezetimibe-simvastatin Nausea Only   Moxifloxacin Other (See Comments)   REACTION: pt states made her "deathly sick" -- nose bleeds Avelox   Nabumetone Nausea Only   Nitrofurantoin Nausea And Vomiting   Sulfonamide Derivatives Nausea Only   Prednisone Rash  Medication List        Accurate as of 06/07/18  8:51 PM. Always use your most recent med list.          ALPRAZolam 0.5 MG tablet Commonly known as:  XANAX TAKE 1 TABLET BY MOUTH 3 TIMES A DAY AS NEEDED FOR ANXIETY AND 2 AT BEDTIME IF NEEDED FOR SLEEP   aspirin 81 MG tablet Take 81 mg by mouth daily.   atorvastatin 20 MG tablet Commonly known as:  LIPITOR Take 1 tablet (20 mg total) by mouth at bedtime.   azelastine 0.1 % nasal spray Commonly known as:  ASTELIN Place 2 sprays into both nostrils at bedtime as needed for rhinitis or allergies. Use in each nostril as directed   BENADRYL PO Take by mouth as needed. Reported on 02/14/2016   ibuprofen 200 MG tablet Commonly known as:  ADVIL,MOTRIN Take 200 mg by mouth every 6 (six) hours as needed.   JANUMET XR 50-500 MG Tb24 Generic drug:  SitaGLIPtin-MetFORMIN HCl Take 1 tablet by mouth daily.   NASACORT AQ NA Place into the  nose. Daily   ONE TOUCH ULTRA 2 w/Device Kit Use once daily to check blood sugar   ONE TOUCH ULTRA TEST test strip Generic drug:  glucose blood USE 1 STRIP DAILY AS DIRECTED.   ONETOUCH DELICA LANCETS 58I Misc CHECK BLOOD SUGAR NO MORE THAN TWICE DAILY   promethazine-codeine 6.25-10 MG/5ML syrup Commonly known as:  PHENERGAN with CODEINE Take 5 mLs by mouth every 8 (eight) hours as needed for cough.   Vitamin D (Cholecalciferol) 1000 units Tabs Take 400 mg by mouth daily.   Zoster Vaccine Adjuvanted injection Commonly known as:  SHINGRIX Inject 0.5 mLs into the muscle once for 1 dose.   ZYRTEC PO Take 1 tablet by mouth daily as needed.          Objective:   Physical Exam BP 128/64 (BP Location: Left Arm, Patient Position: Sitting, Cuff Size: Small)   Pulse 72   Temp 97.8 F (36.6 C) (Oral)   Resp 16   Ht '5\' 6"'  (1.676 m)   Wt 182 lb (82.6 kg)   SpO2 98%   BMI 29.38 kg/m  General:   Well developed, NAD, see BMI.  HEENT:  Normocephalic . Face symmetric, atraumatic Lungs:  CTA B Normal respiratory effort, no intercostal retractions, no accessory muscle use. Heart: RRR, soft systolic  murmur.  No pretibial edema bilaterally MSK: Knees symmetric, no effusion, mild changes consistent with DJD. Skin: Not pale. Not jaundice Neurologic:  alert & oriented X3.  Speech normal, gait appropriate for age and unassisted Psych--  Cognition and judgment appear intact.  Cooperative with normal attention span and concentration.  Behavior appropriate. No anxious or depressed appearing.      Assessment & Plan:   Assessment  DM  Dx 2009--per endo Dr Chalmers Cater Hyperlipidemia Anxiety, insomnia-- xanax per pcp CV: --Ao stenosis mild/mod grade 2 D disfx ECHO 10/2013. Menopausal H/o Kidney stones  PLAN:  Labs 02/15/2018: Creatinine 0.9, sodium 142, potassium 4.9, calcium 9.6, DM: Per endocrine Hyperlipidemia:Currently on Lipitor, will check a FLP, AST, ALT Anxiety,  insomnia: On Xanax, due for a UDS, occ takes a codeine syrup for cough Mild aortic stenosis per echo 2015: Has a mild murmur, no symptoms, consider redo a echo next year. Knee pain: Mild, exam consistent with DJD, she is very active.  Recommend observation for now, continue w/ occasional ibuprofen as she is doing. Preventive care:  [facility] gynecology April  19, had a, mammogram. Shingrix prescription provided, pros and cons discussed.Flu shot today RTC 6 months

## 2018-06-10 LAB — PAIN MGMT, PROFILE 8 W/CONF, U
6 Acetylmorphine: NEGATIVE ng/mL (ref <10)
ALPHAHYDROXYALPRAZOLAM: 112 ng/mL — AB (ref <25)
AMINOCLONAZEPAM: NEGATIVE ng/mL (ref <25)
AMPHETAMINES: NEGATIVE ng/mL (ref <500)
Alcohol Metabolites: NEGATIVE ng/mL (ref <500)
Alphahydroxymidazolam: NEGATIVE ng/mL (ref <50)
Alphahydroxytriazolam: NEGATIVE ng/mL (ref <50)
BENZODIAZEPINES: POSITIVE ng/mL — AB (ref <100)
BUPRENORPHINE, URINE: NEGATIVE ng/mL (ref <5)
CREATININE: 136.9 mg/dL
Cocaine Metabolite: NEGATIVE ng/mL (ref <150)
Hydroxyethylflurazepam: NEGATIVE ng/mL (ref <50)
Lorazepam: NEGATIVE ng/mL (ref <50)
MDMA: NEGATIVE ng/mL (ref <500)
Marijuana Metabolite: NEGATIVE ng/mL (ref <20)
Nordiazepam: NEGATIVE ng/mL (ref <50)
OXIDANT: NEGATIVE ug/mL (ref <200)
Opiates: NEGATIVE ng/mL (ref <100)
Oxazepam: NEGATIVE ng/mL (ref <50)
Oxycodone: NEGATIVE ng/mL (ref <100)
PH: 5.78 (ref 4.5–9.0)
TEMAZEPAM: NEGATIVE ng/mL (ref <50)

## 2018-07-01 ENCOUNTER — Other Ambulatory Visit (INDEPENDENT_AMBULATORY_CARE_PROVIDER_SITE_OTHER): Payer: Medicare Other

## 2018-07-01 DIAGNOSIS — E785 Hyperlipidemia, unspecified: Secondary | ICD-10-CM | POA: Diagnosis not present

## 2018-07-01 LAB — AST: AST: 12 U/L (ref 0–37)

## 2018-07-01 LAB — LIPID PANEL
CHOL/HDL RATIO: 4
Cholesterol: 166 mg/dL (ref 0–200)
HDL: 45.7 mg/dL (ref >39.00)
LDL Cholesterol: 86 mg/dL (ref 0–99)
NONHDL: 120.19
TRIGLYCERIDES: 171 mg/dL — AB (ref 0.0–149.0)
VLDL: 34.2 mg/dL (ref 0.0–40.0)

## 2018-07-01 LAB — ALT: ALT: 15 U/L (ref 0–35)

## 2018-07-20 ENCOUNTER — Telehealth: Payer: Self-pay | Admitting: Internal Medicine

## 2018-07-21 NOTE — Telephone Encounter (Signed)
Sent!

## 2018-07-21 NOTE — Telephone Encounter (Signed)
Pt is requesting refill on alprazolam.   Last OV: 06/07/2018 Last Fill: 03/10/2018 #100 and 0RF Pt sig: 1 tab tid qd prn for anxiety and 2 tab qhs prn UDS: 06/07/2018 Low risk  NCCR printed- no discrepancies noted- sent for scanning

## 2018-07-22 ENCOUNTER — Telehealth: Payer: Self-pay | Admitting: Internal Medicine

## 2018-07-22 NOTE — Telephone Encounter (Signed)
Patient is requesting the [facility]anax be sent [facility] because [facility] out.  [facility] is updated.

## 2018-07-22 NOTE — Telephone Encounter (Signed)
Copied from [place] [id number]. Topic: Quick Communication - See Telephone Encounter >> Jul 22, 2018  2:38 PM Hewitt Shorts wrote: [facility] on [address] is calling to see if Dr. Larose Kells would send in a new rx for zanax because [facility] of [facility] dosage .5mg  and they do not transfer Rx  Best number to [facility] [phone]

## 2018-07-23 MED ORDER — ALPRAZOLAM 0.5 MG PO TABS: ORAL_TABLET | ORAL | 3 refills | Status: DC

## 2018-07-23 NOTE — Telephone Encounter (Signed)
sent 

## 2018-08-19 DIAGNOSIS — E7800 Pure hypercholesterolemia, unspecified: Secondary | ICD-10-CM | POA: Diagnosis not present

## 2018-08-19 DIAGNOSIS — E1165 Type 2 diabetes mellitus with hyperglycemia: Secondary | ICD-10-CM | POA: Diagnosis not present

## 2018-08-31 ENCOUNTER — Other Ambulatory Visit: Payer: Self-pay | Admitting: Internal Medicine

## 2018-10-13 ENCOUNTER — Other Ambulatory Visit: Payer: Self-pay | Admitting: Internal Medicine

## 2018-10-18 DIAGNOSIS — E119 Type 2 diabetes mellitus without complications: Secondary | ICD-10-CM | POA: Diagnosis not present

## 2018-10-18 DIAGNOSIS — Z961 Presence of intraocular lens: Secondary | ICD-10-CM | POA: Diagnosis not present

## 2018-10-18 LAB — HM DIABETES EYE EXAM

## 2018-12-14 ENCOUNTER — Ambulatory Visit: Payer: Medicare Other | Admitting: Internal Medicine

## 2018-12-16 ENCOUNTER — Other Ambulatory Visit: Payer: Self-pay | Admitting: Internal Medicine

## 2019-01-02 ENCOUNTER — Encounter: Payer: Self-pay | Admitting: Internal Medicine

## 2019-01-31 DIAGNOSIS — E7800 Pure hypercholesterolemia, unspecified: Secondary | ICD-10-CM | POA: Diagnosis not present

## 2019-01-31 DIAGNOSIS — E1165 Type 2 diabetes mellitus with hyperglycemia: Secondary | ICD-10-CM | POA: Diagnosis not present

## 2019-01-31 LAB — HEPATIC FUNCTION PANEL
ALT: 31 (ref 7–35)
AST: 14 (ref 13–35)
Alkaline Phosphatase: 84 (ref 25–125)

## 2019-01-31 LAB — LIPID PANEL
Cholesterol: 174 (ref 0–200)
HDL: 48 (ref 35–70)
LDL Cholesterol: 97
Triglycerides: 143 (ref 40–160)

## 2019-01-31 LAB — MICROALBUMIN, URINE: Microalb, Ur: 37.9

## 2019-01-31 LAB — BASIC METABOLIC PANEL
BUN: 19 (ref 4–21)
Creatinine: 0.9 (ref 0.5–1.1)
Glucose: 170
Potassium: 4.3 (ref 3.4–5.3)
Sodium: 140 (ref 137–147)

## 2019-01-31 LAB — HM DIABETES FOOT EXAM

## 2019-01-31 LAB — HEMOGLOBIN A1C: Hemoglobin A1C: 7.3

## 2019-02-01 ENCOUNTER — Ambulatory Visit: Payer: Medicare Other | Admitting: Internal Medicine

## 2019-02-03 DIAGNOSIS — Z1231 Encounter for screening mammogram for malignant neoplasm of breast: Secondary | ICD-10-CM | POA: Diagnosis not present

## 2019-02-03 LAB — HM MAMMOGRAPHY

## 2019-02-11 ENCOUNTER — Encounter: Payer: Self-pay | Admitting: Internal Medicine

## 2019-02-15 ENCOUNTER — Other Ambulatory Visit: Payer: Self-pay

## 2019-02-15 ENCOUNTER — Encounter: Payer: Self-pay | Admitting: Internal Medicine

## 2019-02-15 ENCOUNTER — Ambulatory Visit (INDEPENDENT_AMBULATORY_CARE_PROVIDER_SITE_OTHER): Payer: Medicare Other | Admitting: Internal Medicine

## 2019-02-15 VITALS — BP 140/60 | HR 60 | Temp 98.1°F | Resp 16 | Ht 66.0 in | Wt 179.1 lb

## 2019-02-15 DIAGNOSIS — G4700 Insomnia, unspecified: Secondary | ICD-10-CM

## 2019-02-15 DIAGNOSIS — I350 Nonrheumatic aortic (valve) stenosis: Secondary | ICD-10-CM | POA: Diagnosis not present

## 2019-02-15 DIAGNOSIS — E785 Hyperlipidemia, unspecified: Secondary | ICD-10-CM | POA: Diagnosis not present

## 2019-02-15 DIAGNOSIS — E118 Type 2 diabetes mellitus with unspecified complications: Secondary | ICD-10-CM | POA: Diagnosis not present

## 2019-02-15 DIAGNOSIS — F411 Generalized anxiety disorder: Secondary | ICD-10-CM | POA: Diagnosis not present

## 2019-02-15 NOTE — Assessment & Plan Note (Signed)
DM: Per Dr. Chalmers Cater: on [place], patient reports last A1c 7.1 Hyperlipidemia: See lab tests, continue Lipitor.  Well-controlled Anxiety insomnia: Currently controlled Aortic stenosis: Seems asymptomatic, last echo 2015, recommend echo, patient declined   for now due to COVID-19.  Reassess in 6 months. COVID-19: Following great precautions, praised Recommend early flu shot this season RTC 6 months

## 2019-02-15 NOTE — Assessment & Plan Note (Addendum)
Td 2015;  zostavax 2013; PNM 23:  2014; prevnar 12-2014; shingrex recommended, has a RX already; strongly rec a flu shot early this season  CCS: not ready for a screening Gyn -- saw gyn and had a neg MMG 2019 DEXA -- per gyn

## 2019-02-15 NOTE — Progress Notes (Signed)
Subjective:    Patient ID: Nicole Duran, female    DOB: 1951-07-15, 68 y.o.   MRN: [id number]  DOS:  02/15/2019 Type of visit - description: rov Since the last office visit, she is doing okay. Has allergies when the pollen is high, occasional cough, using Allegra and nose sprays. DM: Follow-up by endocrinology, last A1c per patient 7.1 High cholesterol: Good compliance with atorvastatin.  Review of Systems Denies fever chills No shortness of breath or wheezing No dizziness or lack of stamina Emotionally doing okay.  Past Medical History:  Diagnosis Date  . ALLERGIC RHINITIS 04/22/2007  . Anxiety   . 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 HYPERLIPIDEMIA 04/22/2007  . Insomnia   . Kidney stone   . Menopause   . Metabolic syndrome     Past Surgical History:  Procedure Laterality Date  . cataracts Bilateral 06/15 11/15  . CHOLECYSTECTOMY      Social History   Socioeconomic History  . Marital status: Widowed    Spouse name: Not on file  . Number of children: 1  . Years of education: Not on file  . Highest education level: Not on file  Occupational History  . 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 Financial resource strain: Not on file  . Food insecurity    Worry: Not on file    Inability: Not on file  . Transportation needs    Medical: Not on file    Non-medical: Not on file  Tobacco Use  . Smoking status: Former Research scientist (life sciences)  . Smokeless tobacco: Never Used  . Tobacco comment: never heavy use , quit in the 80s  Substance and Sexual Activity  . Alcohol use: Yes    Alcohol/week: 0.0 standard drinks    Comment: socially  . Drug use: No  . Sexual activity: Not Currently  Lifestyle  . Physical activity    Days per week: Not on file    Minutes per session: Not on file  . Stress: Not on file  Relationships  . Social Herbalist on phone: Not on file    Gets together: Not on file    Attends  religious service: Not on file    Active member of club or organization: Not on file    Attends meetings of clubs or organizations: Not on file    Relationship status: Not on file  . Intimate partner violence    Fear of current or ex partner: Not on file    Emotionally abused: Not on file    Physically abused: Not on file    Forced sexual activity: Not on file  Other Topics Concern  . Not on file  Social History Narrative   Lives by herself, widow (1990), 1 daughter social worker ([place], [place])  , 1 [place] mother 2014            Allergies as of 02/15/2019      Reactions   Cefuroxime Axetil Hives, Shortness Of Breath, Itching   Ceftin   Amoxicillin-pot Clavulanate Nausea And Vomiting   Ezetimibe-simvastatin Nausea Only   Moxifloxacin Other (See Comments)   REACTION: pt states made her "deathly sick" -- nose bleeds Avelox   Nabumetone Nausea Only   Nitrofurantoin Nausea And Vomiting   Sulfonamide Derivatives Nausea Only   Prednisone Rash      Medication List       Accurate as of February 15, 2019  7:20 PM. If you have any questions, ask your nurse or doctor.        ALPRAZolam 0.5 MG tablet Commonly known as: XANAX TAKE 1 TABLET PO TID PRN FOR ANXIETY AND 2 AT BEDTIME PRN insomnia   aspirin 81 MG tablet Take 81 mg by mouth daily.   atorvastatin 20 MG tablet Commonly known as: LIPITOR Take 1 tablet (20 mg total) by mouth at bedtime.   azelastine 0.1 % nasal spray Commonly known as: ASTELIN Place 2 sprays into both nostrils at bedtime as needed for rhinitis or allergies.   BENADRYL PO Take by mouth as needed. Reported on 02/14/2016   ibuprofen 200 MG tablet Commonly known as: ADVIL Take 200 mg by mouth every 6 (six) hours as needed.   Janumet XR [phone] MG Tb24 Generic drug: SitaGLIPtin-MetFORMIN HCl Take 1 tablet by mouth daily. What changed: Another medication with the same name was removed. Continue taking this medication, and follow the directions you  see here. Changed by: Kathlene November, MD   NASACORT AQ NA Place into the nose. Daily   ONE TOUCH ULTRA 2 w/Device Kit Use once daily to check blood sugar   ONE TOUCH ULTRA TEST test strip Generic drug: glucose blood USE 1 STRIP DAILY AS DIRECTED.   OneTouch Delica Lancets 56Y Misc CHECK BLOOD SUGAR NO MORE THAN TWICE DAILY (E11.9)   promethazine-codeine 6.25-10 MG/5ML syrup Commonly known as: PHENERGAN with CODEINE Take 5 mLs by mouth every 8 (eight) hours as needed for cough.   Vitamin D (Cholecalciferol) 25 MCG (1000 UT) Tabs Take 400 mg by mouth daily.   ZYRTEC PO Take 1 tablet by mouth daily as needed.           Objective:   Physical Exam BP 140/60 (BP Location: Right Arm, Patient Position: Sitting, Cuff Size: Small)   Pulse 60   Temp 98.1 F (36.7 C) (Oral)   Resp 16   Ht 5' 6" (1.676 m)   Wt 179 lb 2 oz (81.3 kg)   SpO2 100%   BMI 28.91 kg/m  General:   Well developed, NAD, BMI noted.  HEENT:  Normocephalic . Face symmetric, atraumatic Lungs:  CTA B Normal respiratory effort, no intercostal retractions, no accessory muscle use. Heart: RRR, + systolic murmur most audible at the right costochondral area no pretibial edema bilaterally  Abdomen:  Not distended, soft, non-tender. No rebound or rigidity.   Skin: Not pale. Not jaundice Neurologic:  alert & oriented X3.  Speech normal, gait appropriate for age and unassisted Psych--  Cognition and judgment appear intact.  Cooperative with normal attention span and concentration.  Behavior appropriate. No anxious or depressed appearing.     Assessment      Assessment  DM  Dx 2009--per endo Dr Chalmers Cater Hyperlipidemia Anxiety, insomnia-- xanax per pcp CV: --Ao stenosis mild/mod grade 2 D disfx ECHO 10/2013. Menopausal H/o Kidney stones  PLAN:  DM: Per Dr. Chalmers Cater: on [place], patient reports last A1c 7.1 Hyperlipidemia: See lab tests, continue Lipitor.  Well-controlled Anxiety insomnia: Currently  controlled Aortic stenosis: Seems asymptomatic, last echo 2015, recommend echo, patient declined   for now due to COVID-19.  Reassess in 6 months. COVID-19: Following great precautions, praised Recommend early flu shot this season Preventive care reviewed with RTC 6 months

## 2019-02-15 NOTE — Progress Notes (Signed)
Pre visit review using our clinic review tool, if applicable. No additional management support is needed unless otherwise documented below in the visit note. 

## 2019-02-15 NOTE — Patient Instructions (Signed)
  GO TO THE FRONT DESK Schedule your next appointment  For a check up in 6 months

## 2019-02-26 ENCOUNTER — Other Ambulatory Visit: Payer: Self-pay | Admitting: Internal Medicine

## 2019-03-23 ENCOUNTER — Telehealth: Payer: Self-pay | Admitting: Internal Medicine

## 2019-03-24 NOTE — Telephone Encounter (Signed)
Alprazolam refill.   Last OV: 02/15/2019 Last Fill: 07/23/2018 #100 and 3RF UDS: 06/07/2018 Low risk

## 2019-03-24 NOTE — Telephone Encounter (Signed)
sent 

## 2019-05-25 NOTE — Progress Notes (Signed)
Subjective:   Nicole Duran is a 68 y.o. female who presents for Medicare Annual (Subsequent) preventive examination.  Does brain games daily.  Review of Systems:  Cardiac Risk Factors include: advanced age (>53mn, >>8women);dyslipidemia;diabetes mellitus  Home Safety/Smoke Alarms: Feels safe in home. Smoke alarms in place.  Lives alone with dog. No stairs.   Female:        Mammo-  Pt reports done 12/2018. [facility] [employer] -normal     Dexa scan- pt reports done 12/2017. OBGYN        CCS- declines     Objective:     Vitals: BP 140/82 (BP Location: Left Arm, Patient Position: Sitting, Cuff Size: Normal)   Pulse 63   Temp (!) 96.9 F (36.1 C) (Temporal)   Ht '5\' 6"'  (1.676 m)   Wt 181 lb 12.8 oz (82.5 kg)   SpO2 96%   BMI 29.34 kg/m   Body mass index is 29.34 kg/m.  Advanced Directives 05/26/2019 05/18/2018 04/19/2018 02/27/2017  Does Patient Have a Medical Advance Directive? No No Yes No  Does patient want to make changes to medical advance directive? - Yes (MAU/Ambulatory/Procedural Areas - Information given) No - Patient declined -  Would patient like information on creating a medical advance directive? No - Patient declined - - Yes (MAU/Ambulatory/Procedural Areas - Information given)    Tobacco Social History   Tobacco Use  Smoking Status Former Smoker  Smokeless Tobacco Never Used  Tobacco Comment   never heavy use , quit in the 80s     Counseling given: Not Answered Comment: never heavy use , quit in the 80s   Clinical Intake: Pain : No/denies pain     Past Medical History:  Diagnosis Date  . ALLERGIC RHINITIS 04/22/2007  . Anxiety   . 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 HYPERLIPIDEMIA 04/22/2007  . Insomnia   . Kidney stone   . Menopause   . Metabolic syndrome    Past Surgical History:  Procedure Laterality Date  . cataracts Bilateral 06/15 11/15  . CHOLECYSTECTOMY     Family History  Problem Relation Age of Onset  . Coronary artery disease Father 691    CABG  . Stroke Father 748 . Multiple myeloma Father 764 . Diabetes Mother   . Dementia Mother        mother also had- PAD, psychosis  . Hypertension Mother   . Cancer Neg Hx        colon, breast   Social History   Socioeconomic History  . Marital status: Widowed    Spouse name: Not on file  . Number of children: 1  . Years of education: Not on file  . Highest education level: Not on file  Occupational History  . 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 Financial resource strain: Not on file  . Food insecurity    Worry: Not on file    Inability: Not on file  . Transportation needs    Medical: Not on file    Non-medical: Not on file  Tobacco Use  . Smoking status: Former SResearch scientist (life sciences) . Smokeless tobacco: Never Used  . Tobacco comment: never heavy use , quit in the 80s  Substance and Sexual Activity  . Alcohol use: Yes    Alcohol/week: 0.0 standard drinks    Comment: socially  . Drug use: No  . Sexual activity: Not Currently  Lifestyle  .  Physical activity    Days per week: Not on file    Minutes per session: Not on file  . Stress: Not on file  Relationships  . Social Herbalist on phone: Not on file    Gets together: Not on file    Attends religious service: Not on file    Active member of club or organization: Not on file    Attends meetings of clubs or organizations: Not on file    Relationship status: Not on file  Other Topics Concern  . Not on file  Social History Narrative   Lives by herself, widow (1990), 1 daughter social worker Nicole Duran, Nicole Duran)  , 1 [place] mother 2014          Outpatient Encounter Medications as of 05/26/2019  Medication Sig  . ALPRAZolam (XANAX) 0.5 MG tablet TAKE 1 TABLET BY MOUTH THREE TIMES DAILY AS NEEDED FOR ANXIETY AND 2 AT BEDTIME AS NEEDED  . aspirin 81 MG tablet Take 81 mg by mouth daily.    Marland Kitchen atorvastatin (LIPITOR) 20 MG tablet Take 1 tablet  (20 mg total) by mouth at bedtime.  Marland Kitchen azelastine (ASTELIN) 0.1 % nasal spray Place 2 sprays into both nostrils at bedtime as needed for rhinitis or allergies.  . Blood Glucose Monitoring Suppl (ONE TOUCH ULTRA 2) W/DEVICE KIT Use once daily to check blood sugar  . Cetirizine HCl (ZYRTEC PO) Take 1 tablet by mouth daily as needed.  . DiphenhydrAMINE HCl (BENADRYL PO) Take by mouth as needed. Reported on 02/14/2016  . ibuprofen (ADVIL,MOTRIN) 200 MG tablet Take 200 mg by mouth every 6 (six) hours as needed.    . ONE TOUCH ULTRA TEST test strip USE 1 STRIP DAILY AS DIRECTED.  Marland Kitchen ONETOUCH DELICA LANCETS 30S MISC CHECK BLOOD SUGAR NO MORE THAN TWICE DAILY (E11.9)  . promethazine-codeine (PHENERGAN WITH CODEINE) 6.25-10 MG/5ML syrup Take 5 mLs by mouth every 8 (eight) hours as needed for cough.  . SitaGLIPtin-MetFORMIN HCl (JANUMET XR) [id number] MG TB24 Take 1 tablet by mouth daily.  . Triamcinolone Acetonide (NASACORT AQ NA) Place into the nose. Daily  . Vitamin D, Cholecalciferol, 1000 units TABS Take 400 mg by mouth daily.   No facility-administered encounter medications on file as of 05/26/2019.     Activities of Daily Living In your present state of health, do you have any difficulty performing the following activities: 05/26/2019  Hearing? N  Vision? N  Difficulty concentrating or making decisions? N  Walking or climbing stairs? N  Dressing or bathing? N  Doing errands, shopping? N  Preparing Food and eating ? N  Using the Toilet? N  In the past six months, have you accidently leaked urine? N  Do you have problems with loss of bowel control? N  Managing your Medications? N  Managing your Finances? N  Housekeeping or managing your Housekeeping? N  Some recent data might be hidden    Patient Care Team: Colon Branch, MD as PCP - General Jacelyn Pi, MD as Consulting Physician (Endocrinology) Dene Gentry, MD as Consulting Physician (Sports Medicine) Renato Shin, MD as  Consulting Physician (Endocrinology) Allyn Kenner, DO as Consulting Physician (Obstetrics and Gynecology)    Assessment:   This is a routine wellness examination for Nicole Duran. Physical assessment deferred to PCP.  Exercise Activities and Dietary recommendations Current Exercise Habits: Home exercise routine, Type of exercise: treadmill, Time (Minutes): 20, Frequency (Times/Week): 5, Weekly Exercise (Minutes/Week): 100, Exercise limited  by: None identified Diet (meal preparation, eat out, water intake, caffeinated beverages, dairy products, fruits and vegetables): 24 hr recall Breakfast:oatmeal  Lunch: chicken salad sandwich and chip Dinner:   Chicken alfredo [employer] drinking a lot of water.   Goals    . Maintain healthy lifestyle (pt-stated)       Fall Risk Fall Risk  05/26/2019 05/18/2018 04/19/2018 02/27/2017 02/27/2017  Falls in the past year? 0 No No No No  Number falls in past yr: 0 - - - -  Injury with Fall? 0 - - - -    Depression Screen PHQ 2/9 Scores 05/26/2019 05/18/2018 04/19/2018 04/19/2018  PHQ - 2 Score 0 0 0 0     Cognitive Function Ad8 score reviewed for issues:  Issues making decisions:no  Less interest in hobbies / activities:no  Repeats questions, stories (family complaining):no  Trouble using ordinary gadgets (microwave, computer, phone):no  Forgets the month or year: no  Mismanaging finances: no  Remembering appts:no  Daily problems with thinking and/or memory:no Ad8 score is=0            Immunization History  Administered Date(s) Administered  . Influenza, High Dose Seasonal PF 06/07/2018  . Influenza,inj,Quad PF,6+ Mos 06/05/2017  . Influenza-Unspecified 06/02/2011, 06/01/2013, 06/01/2014, 06/02/2015, 06/01/2016  . Pneumococcal Conjugate-13 12/21/2014  . Pneumococcal Polysaccharide-23 08/22/2013  . Td 09/02/2003  . Tetanus 01/03/2014  . Zoster 05/12/2012    Screening Tests Health Maintenance  Topic Date Due  . COLONOSCOPY   12/07/1998  . HEMOGLOBIN A1C  02/01/2018  . PNA vac Low Risk Adult (2 of 2 - PPSV23) 08/22/2018  . OPHTHALMOLOGY EXAM  10/06/2018  . MAMMOGRAM  12/09/2018  . FOOT EXAM  01/31/2019  . INFLUENZA VACCINE  04/02/2019  . TETANUS/TDAP  01/04/2024  . DEXA SCAN  Completed  . Hepatitis C Screening  Completed      Plan:   See you next year!  Continue to eat heart healthy diet (full of fruits, vegetables, whole grains, lean protein, water--limit salt, fat, and sugar intake) and increase physical activity as tolerated.  Continue doing brain stimulating activities (puzzles, reading, adult coloring books, staying active) to keep memory sharp.     I have personally reviewed and noted the following in the patient's chart:   . Medical and social history . Use of alcohol, tobacco or illicit drugs  . Current medications and supplements . Functional ability and status . Nutritional status . Physical activity . Advanced directives . List of other physicians . Hospitalizations, surgeries, and ER visits in previous 12 months . Vitals . Screenings to include cognitive, depression, and falls . Referrals and appointments  In addition, I have reviewed and discussed with patient certain preventive protocols, quality metrics, and best practice recommendations. A written personalized care plan for preventive services as well as general preventive health recommendations were provided to patient.     Naaman Plummer [place], [place]  05/26/2019

## 2019-05-26 ENCOUNTER — Ambulatory Visit (INDEPENDENT_AMBULATORY_CARE_PROVIDER_SITE_OTHER): Payer: Medicare Other | Admitting: *Deleted

## 2019-05-26 ENCOUNTER — Other Ambulatory Visit: Payer: Self-pay

## 2019-05-26 ENCOUNTER — Encounter: Payer: Self-pay | Admitting: *Deleted

## 2019-05-26 DIAGNOSIS — Z23 Encounter for immunization: Secondary | ICD-10-CM | POA: Diagnosis not present

## 2019-05-26 NOTE — Patient Instructions (Signed)
See you next year!  Continue to eat heart healthy diet (full of fruits, vegetables, whole grains, lean protein, water--limit salt, fat, and sugar intake) and increase physical activity as tolerated.  Continue doing brain stimulating activities (puzzles, reading, adult coloring books, staying active) to keep memory sharp.    Nicole Duran , Thank you for taking time to come for your Medicare Wellness Visit. I appreciate your ongoing commitment to your health goals. Please review the following plan we discussed and let me know if I can assist you in the future.   These are the goals we discussed: Goals    . Maintain healthy lifestyle (pt-stated)       This is a list of the screening recommended for you and due dates:  Health Maintenance  Topic Date Due  . Colon Cancer Screening  12/07/1998  . Hemoglobin A1C  02/01/2018  . Pneumonia vaccines (2 of 2 - PPSV23) 08/22/2018  . Eye exam for diabetics  10/06/2018  . Mammogram  12/09/2018  . Complete foot exam   01/31/2019  . Flu Shot  04/02/2019  . Tetanus Vaccine  01/04/2024  . DEXA scan (bone density measurement)  Completed  .  Hepatitis C: One time screening is recommended by Center for Disease Control  (CDC) for  adults born from [birth year] through 1967.   Completed    Health Maintenance After Age 45 After age 21, you are at a higher risk for certain long-term diseases and infections as well as injuries from falls. Falls are a major cause of broken bones and head injuries in people who are older than age 19. Getting regular preventive care can help to keep you healthy and well. Preventive care includes getting regular testing and making lifestyle changes as recommended by your health care provider. Talk with your health care provider about:  Which screenings and tests you should have. A screening is a test that checks for a disease when you have no symptoms.  A diet and exercise plan that is right for you. What should I know about screenings  and tests to prevent falls? Screening and testing are the best ways to find a health problem early. Early diagnosis and treatment give you the best chance of managing medical conditions that are common after age 27. Certain conditions and lifestyle choices may make you more likely to have a fall. Your health care provider may recommend:  Regular vision checks. Poor vision and conditions such as cataracts can make you more likely to have a fall. If you wear glasses, make sure to get your prescription updated if your vision changes.  Medicine review. Work with your health care provider to regularly review all of the medicines you are taking, including over-the-counter medicines. Ask your health care provider about any side effects that may make you more likely to have a fall. Tell your health care provider if any medicines that you take make you feel dizzy or sleepy.  Osteoporosis screening. Osteoporosis is a condition that causes the bones to get weaker. This can make the bones weak and cause them to break more easily.  Blood pressure screening. Blood pressure changes and medicines to control blood pressure can make you feel dizzy.  Strength and balance checks. Your health care provider may recommend certain tests to check your strength and balance while standing, walking, or changing positions.  Foot health exam. Foot pain and numbness, as well as not wearing proper footwear, can make you more likely to have a fall.  Depression screening. You may be more likely to have a fall if you have a fear of falling, feel emotionally low, or feel unable to do activities that you used to do.  Alcohol use screening. Using too much alcohol can affect your balance and may make you more likely to have a fall. What actions can I take to lower my risk of falls? General instructions  Talk with your health care provider about your risks for falling. Tell your health care provider if: ? You fall. Be sure to tell  your health care provider about all falls, even ones that seem minor. ? You feel dizzy, sleepy, or off-balance.  Take over-the-counter and prescription medicines only as told by your health care provider. These include any supplements.  Eat a healthy diet and maintain a healthy weight. A healthy diet includes low-fat dairy products, low-fat (lean) meats, and fiber from whole grains, beans, and lots of fruits and vegetables. Home safety  Remove any tripping hazards, such as rugs, cords, and clutter.  Install safety equipment such as grab bars in bathrooms and safety rails on stairs.  Keep rooms and walkways well-lit. Activity   Follow a regular exercise program to stay fit. This will help you maintain your balance. Ask your health care provider what types of exercise are appropriate for you.  If you need a cane or walker, use it as recommended by your health care provider.  Wear supportive shoes that have nonskid soles. Lifestyle  Do not drink alcohol if your health care provider tells you not to drink.  If you drink alcohol, limit how much you have: ? 0-1 drink a day for women. ? 0-2 drinks a day for men.  Be aware of how much alcohol is in your drink. In the [place] one drink equals one typical bottle of beer (12 oz), one-half glass of wine (5 oz), or one shot of hard liquor (1 oz).  Do not use any products that contain nicotine or tobacco, such as cigarettes and e-cigarettes. If you need help quitting, ask your health care provider. Summary  Having a healthy lifestyle and getting preventive care can help to protect your health and wellness after age 27.  Screening and testing are the best way to find a health problem early and help you avoid having a fall. Early diagnosis and treatment give you the best chance for managing medical conditions that are more common for people who are older than age 14.  Falls are a major cause of broken bones and head injuries in people who are  older than age 1. Take precautions to prevent a fall at home.  Work with your health care provider to learn what changes you can make to improve your health and wellness and to prevent falls. This information is not intended to replace advice given to you by your health care provider. Make sure you discuss any questions you have with your health care provider. Document Released: 07/01/2017 Document Revised: 12/09/2018 Document Reviewed: 07/01/2017 Elsevier Patient Education  2020 [employer].

## 2019-06-03 ENCOUNTER — Encounter: Payer: Self-pay | Admitting: Internal Medicine

## 2019-06-03 DIAGNOSIS — D225 Melanocytic nevi of trunk: Secondary | ICD-10-CM | POA: Diagnosis not present

## 2019-06-03 DIAGNOSIS — Z85828 Personal history of other malignant neoplasm of skin: Secondary | ICD-10-CM | POA: Diagnosis not present

## 2019-06-03 DIAGNOSIS — L821 Other seborrheic keratosis: Secondary | ICD-10-CM | POA: Diagnosis not present

## 2019-06-03 DIAGNOSIS — L820 Inflamed seborrheic keratosis: Secondary | ICD-10-CM | POA: Diagnosis not present

## 2019-06-03 DIAGNOSIS — L814 Other melanin hyperpigmentation: Secondary | ICD-10-CM | POA: Diagnosis not present

## 2019-06-03 DIAGNOSIS — D1801 Hemangioma of skin and subcutaneous tissue: Secondary | ICD-10-CM | POA: Diagnosis not present

## 2019-06-06 ENCOUNTER — Encounter: Payer: Self-pay | Admitting: Internal Medicine

## 2019-06-13 DIAGNOSIS — E1165 Type 2 diabetes mellitus with hyperglycemia: Secondary | ICD-10-CM | POA: Diagnosis not present

## 2019-06-13 DIAGNOSIS — E7800 Pure hypercholesterolemia, unspecified: Secondary | ICD-10-CM | POA: Diagnosis not present

## 2019-06-24 ENCOUNTER — Telehealth: Payer: Self-pay

## 2019-06-24 NOTE — Telephone Encounter (Signed)
Copied from [place] [id number]. Topic: General - Inquiry >> Jun 24, 2019 10:33 AM Mathis Bud wrote: Reason for CRM: patient would like to know why she was messaged to get covid test.  Patient states she got a mychart message regarding she has to get a covid test before her next appt.  Call back [phone]

## 2019-06-24 NOTE — Telephone Encounter (Signed)
Spoke w/ Pt- informed Pt of below. Pt verbalized understanding.

## 2019-06-24 NOTE — Telephone Encounter (Signed)
Spoke w/ Pt- was unsure what she was referring to- she received Mychart message from [facility] on 06/23/2019 at 5:15PM regarding what she thought was a COVID test that was required before her next visit. Clarification received from [facility]- mass message sent to all patients regarding pre-covid screening here in the office prior to appts that we are doing to keep our office safe. I will call Pt back to inform her of this information.

## 2019-08-03 ENCOUNTER — Other Ambulatory Visit: Payer: Self-pay | Admitting: Internal Medicine

## 2019-08-15 ENCOUNTER — Other Ambulatory Visit: Payer: Self-pay

## 2019-08-16 ENCOUNTER — Ambulatory Visit: Payer: Medicare Other | Admitting: Internal Medicine

## 2019-08-16 ENCOUNTER — Other Ambulatory Visit: Payer: Self-pay

## 2019-08-16 ENCOUNTER — Encounter: Payer: Self-pay | Admitting: Internal Medicine

## 2019-08-16 ENCOUNTER — Ambulatory Visit (INDEPENDENT_AMBULATORY_CARE_PROVIDER_SITE_OTHER): Payer: Medicare Other | Admitting: Internal Medicine

## 2019-08-16 VITALS — BP 172/70 | HR 69 | Temp 96.1°F | Resp 16 | Ht 65.5 in | Wt 181.0 lb

## 2019-08-16 DIAGNOSIS — E118 Type 2 diabetes mellitus with unspecified complications: Secondary | ICD-10-CM

## 2019-08-16 DIAGNOSIS — R030 Elevated blood-pressure reading, without diagnosis of hypertension: Secondary | ICD-10-CM

## 2019-08-16 DIAGNOSIS — M25562 Pain in left knee: Secondary | ICD-10-CM | POA: Diagnosis not present

## 2019-08-16 DIAGNOSIS — E785 Hyperlipidemia, unspecified: Secondary | ICD-10-CM | POA: Diagnosis not present

## 2019-08-16 DIAGNOSIS — F411 Generalized anxiety disorder: Secondary | ICD-10-CM

## 2019-08-16 NOTE — Progress Notes (Signed)
Subjective:    Patient ID: Nicole Duran, female    DOB: [birth year], 68 y.o.   MRN: [id number]  DOS:  08/16/2019 Type of visit - description: Routine checkup DM: Request labs to be forwarded to endocrinology Aortic stenosis: No symptoms, see review of systems Elevated BP: No ambulatory BPs, patient report is anxious coming to the doctor due to COVID-19 Anxiety insomnia: Uses Xanax from time to time again very nervous about the pandemia. Left knee pain: She is doing more treadmill than before, has noted left knee pain without swelling.  Taking ibuprofen almost daily.  Using ice.   Review of Systems Denies chest pain no difficulty breathing No lower extremity edema No near syncope with exertion No nausea, vomiting, diarrhea  Past Medical History:  Diagnosis Date  . ALLERGIC RHINITIS 04/22/2007  . Anxiety   . 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 HYPERLIPIDEMIA 04/22/2007  . Insomnia   . Kidney stone   . Menopause   . Metabolic syndrome     Past Surgical History:  Procedure Laterality Date  . cataracts Bilateral 06/15 11/15  . CHOLECYSTECTOMY      Social History   Socioeconomic History  . Marital status: Widowed    Spouse name: Not on file  . Number of children: 1  . Years of education: Not on file  . Highest education level: Not on file  Occupational History  . 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 Smoking status: Former Research scientist (life sciences)  . Smokeless tobacco: Never Used  . Tobacco comment: never heavy use , quit in the 80s  Substance and Sexual Activity  . Alcohol use: Yes    Alcohol/week: 0.0 standard drinks    Comment: socially  . Drug use: No  . Sexual activity: Not Currently  Other Topics Concern  . Not on file  Social History Narrative   Lives by herself, widow (1990), 1 daughter social worker ([place], [place])  , [place] mother 2014         Social Determinants of Radio broadcast assistant  Strain:   . Difficulty of Paying Living Expenses: Not on file  Food Insecurity:   . Worried About Charity fundraiser in the Last Year: Not on file  . Ran Out of Food in the Last Year: Not on file  Transportation Needs:   . Lack of Transportation (Medical): Not on file  . Lack of Transportation (Non-Medical): Not on file  Physical Activity:   . Days of Exercise per Week: Not on file  . Minutes of Exercise per Session: Not on file  Stress:   . Feeling of Stress : Not on file  Social Connections:   . Frequency of Communication with Friends and Family: Not on file  . Frequency of Social Gatherings with Friends and Family: Not on file  . Attends Religious Services: Not on file  . Active Member of Clubs or Organizations: Not on file  . Attends Archivist Meetings: Not on file  . Marital Status: Not on file  Intimate Partner Violence:   . Fear of Current or Ex-Partner: Not on file  . Emotionally Abused: Not on file  . Physically Abused: Not on file  . Sexually Abused: Not on file      Allergies as of 08/16/2019      Reactions   Cefuroxime Axetil Hives, Shortness Of Breath, Itching   Ceftin   Amoxicillin-pot  Clavulanate Nausea And Vomiting   Ezetimibe-simvastatin Nausea Only   Moxifloxacin Other (See Comments)   REACTION: pt states made her "deathly sick" -- nose bleeds Avelox   Nabumetone Nausea Only   Nitrofurantoin Nausea And Vomiting   Sulfonamide Derivatives Nausea Only   Prednisone Rash      Medication List       Accurate as of August 16, 2019  3:02 PM. If you have any questions, ask your nurse or doctor.        ALPRAZolam 0.5 MG tablet Commonly known as: XANAX TAKE 1 TABLET BY MOUTH THREE TIMES DAILY AS NEEDED FOR ANXIETY AND 2 AT BEDTIME AS NEEDED   aspirin 81 MG tablet Take 81 mg by mouth daily.   atorvastatin 20 MG tablet Commonly known as: LIPITOR Take 1 tablet (20 mg total) by mouth at bedtime.   azelastine 0.1 % nasal spray Commonly  known as: ASTELIN Place 2 sprays into both nostrils at bedtime as needed for rhinitis or allergies.   BENADRYL PO Take by mouth as needed. Reported on 02/14/2016   ibuprofen 200 MG tablet Commonly known as: ADVIL Take 200 mg by mouth every 6 (six) hours as needed.   Janumet XR [phone] MG Tb24 Generic drug: SitaGLIPtin-MetFORMIN HCl Take 1 tablet by mouth daily.   NASACORT AQ NA Place into the nose. Daily   ONE TOUCH ULTRA 2 w/Device Kit Use once daily to check blood sugar   ONE TOUCH ULTRA TEST test strip Generic drug: glucose blood USE 1 STRIP DAILY AS DIRECTED.   OneTouch Delica Lancets 80K Misc CHECK BLOOD SUGAR NO MORE THAN TWICE DAILY (E11.9)   promethazine-codeine 6.25-10 MG/5ML syrup Commonly known as: PHENERGAN with CODEINE Take 5 mLs by mouth every 8 (eight) hours as needed for cough.   Vitamin D (Cholecalciferol) 25 MCG (1000 UT) Tabs Take 400 mg by mouth daily.   ZYRTEC PO Take 1 tablet by mouth daily as needed.           Objective:   Physical Exam BP (!) 172/70 (BP Location: Right Arm, Patient Position: Sitting, Cuff Size: Large)   Pulse 69   Temp (!) 96.1 F (35.6 C) (Temporal)   Resp 16   Ht 5' 5.5" (1.664 m)   Wt 181 lb (82.1 kg)   SpO2 100%   BMI 29.66 kg/m  General:   Well developed, NAD, BMI noted. HEENT:  Normocephalic . Face symmetric, atraumatic Lungs:  CTA B Normal respiratory effort, no intercostal retractions, no accessory muscle use. Heart: RRR, + systolic murmur.  No pretibial edema bilaterally MSK: Knees symmetric with bony changes consistent with DJD but no effusion, redness, swelling. Skin: Not pale. Not jaundice Neurologic:  alert & oriented X3.  Speech normal, gait appropriate for age and unassisted Psych--  Cognition and judgment appear intact.  Cooperative with normal attention span and concentration.  Behavior appropriate. No anxious or depressed appearing.      Assessment      Assessment  DM  Dx  2009--per endo Dr Chalmers Cater Hyperlipidemia Anxiety, insomnia-- xanax per pcp CV: --Ao stenosis mild/mod grade 2 D disfx ECHO 10/2013. Menopausal H/o Kidney stones  PLAN:  DM: Saw Dr. Chalmers Cater 01/2019, Janumet XR dose increased to [phone] mg nightly.  Request labs will do A1c and forward to Endo Elevated BP: BP slightly elevated, I recheck it: 160/80.  She reports he is anxious about the quarantine and COVID-19.  She is also taking ibuprofen. She cannot do ambulatory BPs at home. Plan:  Moderate NSAIDs consumption, schedule a nurse visit in 1 month for BP check.  Check CBC, TSH Hyperlipidemia: Well-controlled per last FLP.  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  Nevertheless I recommend her to continue using her treadmill, use Tylenol as her primary pain medication and ibuprofen sporadically.  Ice and a knee sleeve are recommended. Follow-up Labs next week: CMP, CBC, A1c, TSH Nurse visit in 1 month RTC 6 months for a routine visit.      This visit occurred during the SARS-CoV-2 public health emergency.  Safety protocols were in place, including screening questions prior to the visit, additional usage of staff PPE, and extensive cleaning of exam room while observing appropriate contact time as indicated for disinfecting solutions.

## 2019-08-16 NOTE — Patient Instructions (Signed)
  GO TO THE FRONT DESK  Schedule nurse visit in 4 weeks from today to check your blood pressure  Schedule a office visit with me in 6 months  For knee pain: Take Tylenol up to 3 times a day. If you continue with pain okay to take ibuprofen 1 or 2 tablets over-the-counter with food once daily.  Ice the knee after exercise  Use a knee sleeve  If you are not better let me know.  We will refer you to orthopedic doctor

## 2019-08-17 ENCOUNTER — Encounter: Payer: Self-pay | Admitting: Internal Medicine

## 2019-08-17 NOTE — Assessment & Plan Note (Signed)
DM: Saw Dr. Chalmers Cater 01/2019, Janumet XR dose increased to [phone] mg nightly.  Request labs will do A1c and forward to Endo Elevated BP: BP slightly elevated, I recheck it: 160/80.  She reports he is anxious about the quarantine and COVID-19.  She is also taking ibuprofen. She cannot do ambulatory BPs at home. Plan: Moderate NSAIDs consumption, schedule a nurse visit in 1 month for BP check.  Check CBC, TSH Hyperlipidemia: Well-controlled per last FLP.  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  Nevertheless I recommend her to continue using her treadmill, use Tylenol as her primary pain medication and ibuprofen sporadically.  Ice and a knee sleeve are recommended. Follow-up Labs next week: CMP, CBC, A1c, TSH Nurse visit in 1 month RTC 6 months for a routine visit.

## 2019-08-22 ENCOUNTER — Other Ambulatory Visit: Payer: Self-pay

## 2019-08-22 ENCOUNTER — Other Ambulatory Visit (INDEPENDENT_AMBULATORY_CARE_PROVIDER_SITE_OTHER): Payer: Medicare Other

## 2019-08-22 DIAGNOSIS — R030 Elevated blood-pressure reading, without diagnosis of hypertension: Secondary | ICD-10-CM

## 2019-08-22 DIAGNOSIS — E118 Type 2 diabetes mellitus with unspecified complications: Secondary | ICD-10-CM | POA: Diagnosis not present

## 2019-08-22 DIAGNOSIS — E785 Hyperlipidemia, unspecified: Secondary | ICD-10-CM

## 2019-08-22 LAB — COMPREHENSIVE METABOLIC PANEL
ALT: 20 U/L (ref 0–35)
AST: 14 U/L (ref 0–37)
Albumin: 4.4 g/dL (ref 3.5–5.2)
Alkaline Phosphatase: 78 U/L (ref 39–117)
BUN: 17 mg/dL (ref 6–23)
CO2: 29 mEq/L (ref 19–32)
Calcium: 10.2 mg/dL (ref 8.4–10.5)
Chloride: 103 mEq/L (ref 96–112)
Creatinine, Ser: 0.86 mg/dL (ref 0.40–1.20)
GFR: 65.1 mL/min (ref >60.00)
Glucose, Bld: 193 mg/dL — ABNORMAL HIGH (ref 70–99)
Potassium: 4.4 mEq/L (ref 3.5–5.1)
Sodium: 140 mEq/L (ref 135–145)
Total Bilirubin: 0.5 mg/dL (ref 0.2–1.2)
Total Protein: 7.2 g/dL (ref 6.0–8.3)

## 2019-08-22 LAB — CBC
HCT: 44.8 % (ref 36.0–46.0)
Hemoglobin: 14.8 g/dL (ref 12.0–15.0)
MCHC: 33.1 g/dL (ref 30.0–36.0)
MCV: 95.2 fl (ref 78.0–100.0)
Platelets: 279 10*3/uL (ref 150.0–400.0)
RBC: 4.7 Mil/uL (ref 3.87–5.11)
RDW: 13.7 % (ref 11.5–15.5)
WBC: 7.7 10*3/uL (ref 4.0–10.5)

## 2019-08-22 LAB — HEMOGLOBIN A1C: Hgb A1c MFr Bld: 7.5 % — ABNORMAL HIGH (ref 4.6–6.5)

## 2019-08-22 LAB — TSH: TSH: 4.25 u[IU]/mL (ref 0.35–4.50)

## 2019-09-16 ENCOUNTER — Other Ambulatory Visit: Payer: Self-pay

## 2019-09-16 ENCOUNTER — Ambulatory Visit (INDEPENDENT_AMBULATORY_CARE_PROVIDER_SITE_OTHER): Payer: Medicare Other | Admitting: Family Medicine

## 2019-09-16 DIAGNOSIS — I10 Essential (primary) hypertension: Secondary | ICD-10-CM | POA: Diagnosis not present

## 2019-09-16 MED ORDER — AMLODIPINE BESYLATE 5 MG PO TABS: 5.0000 mg | ORAL_TABLET | Freq: Every day | ORAL | 1 refills | Status: DC

## 2019-09-16 NOTE — Progress Notes (Signed)
Pt here for Blood pressure check per Dr. Larose Kells  Pt currently takes: no blood pressure medications . BP today @ = 190/90 HR =94  Rechecked on machine BP= 184/88  Pt advised per Dr. Nani Ravens DOD  BP Readings from Last 3 Encounters:  08/16/19 (!) 172/70  05/26/19 140/82  02/15/19 140/60   Per Dr. Nani Ravens, please start norvasc 5 mg one a day. Recheck with Dr. Larose Kells in 2 weeks.

## 2019-09-16 NOTE — Progress Notes (Signed)
Agree. Crosby Oyster Sanika Brosious 12:25 PM 09/16/19

## 2019-09-29 ENCOUNTER — Ambulatory Visit: Payer: Medicare Other

## 2019-09-30 ENCOUNTER — Ambulatory Visit: Payer: Medicare Other | Admitting: Internal Medicine

## 2019-09-30 ENCOUNTER — Ambulatory Visit: Payer: Medicare Other

## 2019-10-08 ENCOUNTER — Other Ambulatory Visit: Payer: Self-pay | Admitting: Family Medicine

## 2019-10-08 DIAGNOSIS — I10 Essential (primary) hypertension: Secondary | ICD-10-CM

## 2019-10-09 ENCOUNTER — Ambulatory Visit: Payer: Medicare Other | Attending: Internal Medicine

## 2019-10-09 DIAGNOSIS — Z23 Encounter for immunization: Secondary | ICD-10-CM | POA: Insufficient documentation

## 2019-10-09 NOTE — Progress Notes (Signed)
   Covid-19 Vaccination Clinic  Name:  Nicole Duran    MRN: [id number] DOB: 11-06-1950  10/09/2019  [name] was observed post Covid-19 immunization for 30 minutes based on pre-vaccination screening without incidence. She was provided with Vaccine Information Sheet and instruction to access the V-Safe system.   [name] was instructed to call 911 with any severe reactions post vaccine: Marland Kitchen Difficulty breathing  . Swelling of your face and throat  . A fast heartbeat  . A bad rash all over your body  . Dizziness and weakness    Immunizations Administered    Name Date Dose VIS Date Route   Pfizer COVID-19 Vaccine 10/09/2019  5:01 PM 0.3 mL 08/12/2019 Intramuscular   Manufacturer: [place]   Lot: [id number]   [place]: [id number]

## 2019-10-18 ENCOUNTER — Other Ambulatory Visit: Payer: Self-pay | Admitting: Internal Medicine

## 2019-10-30 ENCOUNTER — Ambulatory Visit: Payer: Medicare Other

## 2019-11-02 ENCOUNTER — Other Ambulatory Visit: Payer: Self-pay | Admitting: Internal Medicine

## 2019-11-02 DIAGNOSIS — I10 Essential (primary) hypertension: Secondary | ICD-10-CM

## 2019-11-03 ENCOUNTER — Ambulatory Visit: Payer: Medicare Other | Attending: Internal Medicine

## 2019-11-03 DIAGNOSIS — Z23 Encounter for immunization: Secondary | ICD-10-CM

## 2019-11-03 NOTE — Progress Notes (Signed)
   Covid-19 Vaccination Clinic  Name:  EMRI CADD    MRN: [id number] DOB: August 03, 1951  11/03/2019  Ms. Meggison was observed post Covid-19 immunization for 30 minutes based on pre-vaccination screening without incident. She was provided with Vaccine Information Sheet and instruction to access the V-Safe system.   Ms. Porritt was instructed to call 911 with any severe reactions post vaccine: Marland Kitchen Difficulty breathing  . Swelling of face and throat  . A fast heartbeat  . A bad rash all over body  . Dizziness and weakness   Immunizations Administered    Name Date Dose VIS Date Route   Pfizer COVID-19 Vaccine 11/03/2019 12:55 PM 0.3 mL 08/12/2019 Intramuscular   Manufacturer: [place]   Lot: [id number]   [place]: [id number]

## 2019-11-10 ENCOUNTER — Other Ambulatory Visit: Payer: Self-pay | Admitting: Internal Medicine

## 2019-11-10 DIAGNOSIS — I10 Essential (primary) hypertension: Secondary | ICD-10-CM

## 2019-11-11 NOTE — Telephone Encounter (Signed)
Refilled pt's amlodipine. Pt had nurse visit on 09/16/19 and advised to follow up with PCP in 2 weeks for a recheck. Called pt and advised she needs f/u earlier than June for her BP check. Scheduled OV for 11/17/19 at 8:20am.

## 2019-11-13 ENCOUNTER — Other Ambulatory Visit: Payer: Self-pay | Admitting: Internal Medicine

## 2019-11-16 ENCOUNTER — Other Ambulatory Visit: Payer: Self-pay

## 2019-11-17 ENCOUNTER — Other Ambulatory Visit: Payer: Self-pay

## 2019-11-17 ENCOUNTER — Ambulatory Visit (INDEPENDENT_AMBULATORY_CARE_PROVIDER_SITE_OTHER): Payer: Medicare Other | Admitting: Internal Medicine

## 2019-11-17 ENCOUNTER — Encounter: Payer: Self-pay | Admitting: Internal Medicine

## 2019-11-17 VITALS — BP 160/80 | HR 79 | Temp 95.7°F | Resp 16 | Ht 66.0 in | Wt 177.5 lb

## 2019-11-17 DIAGNOSIS — G4700 Insomnia, unspecified: Secondary | ICD-10-CM | POA: Diagnosis not present

## 2019-11-17 DIAGNOSIS — I10 Essential (primary) hypertension: Secondary | ICD-10-CM | POA: Diagnosis not present

## 2019-11-17 DIAGNOSIS — F411 Generalized anxiety disorder: Secondary | ICD-10-CM | POA: Diagnosis not present

## 2019-11-17 MED ORDER — AMLODIPINE BESYLATE 5 MG PO TABS: 5.0000 mg | ORAL_TABLET | Freq: Every day | ORAL | 4 refills | Status: DC

## 2019-11-17 NOTE — Patient Instructions (Addendum)
Per our records you are due for an eye exam. Please contact your eye doctor to schedule an appointment. Please have them send copies of your office visit notes to [place]. Our fax number is [phone].   Continue checking your blood pressures, your ideal BP is between 110/65 and  135/85. As long as he is less than 140 when you check at home is okay.  Let me know how your blood pressure is in April when you see your endocrinologist  See you in June

## 2019-11-17 NOTE — Progress Notes (Signed)
Pre visit review using our clinic review tool, if applicable. No additional management support is needed unless otherwise documented below in the visit note. 

## 2019-11-17 NOTE — Progress Notes (Signed)
Subjective:    Patient ID: Nicole Duran, female    DOB: 1951/02/06, 69 y.o.   MRN: [id number]  DOS:  11/17/2019 Type of visit - description: Follow-up Today we talk about hypertension, anxiety and diabetes.  Nurse visit 09/16/2019----- BP was 190/90, amlodipine 5 mg was added. Since then she is checking ambulatory BPs and they are good.  Continue with anxiety due to the Covid quarantine  DM: Is her observation that ambulatory CBGs are slightly higher since she started amlodipine,they are in the 150s.   Review of Systems Is chest pain, difficulty breathing or edema  Past Medical History:  Diagnosis Date  . ALLERGIC RHINITIS 04/22/2007  . Anxiety   . 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 HYPERLIPIDEMIA 04/22/2007  . Insomnia   . Kidney stone   . Menopause   . Metabolic syndrome     Past Surgical History:  Procedure Laterality Date  . cataracts Bilateral 06/15 11/15  . CHOLECYSTECTOMY      Allergies as of 11/17/2019      Reactions   Cefuroxime Axetil Hives, Shortness Of Breath, Itching   Ceftin   Amoxicillin-pot Clavulanate Nausea And Vomiting   Ezetimibe-simvastatin Nausea Only   Moxifloxacin Other (See Comments)   REACTION: pt states made her "deathly sick" -- nose bleeds Avelox   Nabumetone Nausea Only   Nitrofurantoin Nausea And Vomiting   Sulfonamide Derivatives Nausea Only   Prednisone Rash      Medication List       Accurate as of November 17, 2019  8:34 AM. If you have any questions, ask your nurse or doctor.        ALPRAZolam 0.5 MG tablet Commonly known as: XANAX TAKE 1 TABLET BY MOUTH THREE TIMES DAILY AS NEEDED FOR ANXIETY AND 2 AT BEDTIME AS NEEDED   amLODipine 5 MG tablet Commonly known as: NORVASC TAKE 1 TABLET BY MOUTH EVERY DAY   aspirin 81 MG tablet Take 81 mg by mouth daily.   atorvastatin 20 MG tablet Commonly known as: LIPITOR Take 1 tablet (20 mg total) by mouth at bedtime.   azelastine 0.1 % nasal spray Commonly known as: ASTELIN Place 2  sprays into both nostrils at bedtime as needed for rhinitis or allergies.   BENADRYL PO Take by mouth as needed. Reported on 02/14/2016   ibuprofen 200 MG tablet Commonly known as: ADVIL Take 200 mg by mouth every 6 (six) hours as needed.   Janumet XR [phone] MG Tb24 Generic drug: SitaGLIPtin-MetFORMIN HCl Take 1 tablet by mouth daily.   NASACORT AQ NA Place into the nose. Daily   ONE TOUCH ULTRA 2 w/Device Kit Use once daily to check blood sugar   ONE TOUCH ULTRA TEST test strip Generic drug: glucose blood USE 1 STRIP DAILY AS DIRECTED.   OneTouch Delica Lancets 48G Misc Check blood sugar twice daily   promethazine-codeine 6.25-10 MG/5ML syrup Commonly known as: PHENERGAN with CODEINE Take 5 mLs by mouth every 8 (eight) hours as needed for cough.   Vitamin D (Cholecalciferol) 25 MCG (1000 UT) Tabs Take 400 mg by mouth daily.   ZYRTEC PO Take 1 tablet by mouth daily as needed.          Objective:   Physical Exam BP (!) 161/51 (BP Location: Left Arm, Patient Position: Sitting, Cuff Size: Normal)   Pulse 79   Temp (!) 95.7 F (35.4 C) (Temporal)   Resp 16   Ht '5\' 6"'  (1.676 m)   Wt 177 lb 8 oz (80.5  kg)   SpO2 100%   BMI 28.65 kg/m  General:   Well developed, NAD, BMI noted. HEENT:  Normocephalic . Face symmetric, atraumatic Lungs:  CTA B Normal respiratory effort, no intercostal retractions, no accessory muscle use. Heart: RRR,  no murmur.  Lower extremities: no pretibial edema bilaterally  Skin: Not pale. Not jaundice Neurologic:  alert & oriented X3.  Speech normal, gait appropriate for age and unassisted Psych--  Cognition and judgment appear intact.  Cooperative with normal attention span and concentration.  Behavior appropriate. No anxious or depressed appearing.      Assessment    Assessment  HTN: DX 09-2019 DM  Dx 2009--per endo Dr Chalmers Cater Hyperlipidemia Anxiety, insomnia-- xanax per pcp CV: --Ao stenosis mild/mod grade 2 D disfx ECHO  10/2013. Menopausal H/o Kidney stones  PLAN:  HTN: BP was elevated when she came to the office in January, amlodipine was added, ambulatory BPs w/ wrist cuff: 120s/77, with arm cuff the 140s.  BP today is elevated when she arrived, I rechecked: 160/80 on the left arm. Reported that she is eating low-salt, not taking NSAIDs, takes a walk 3 times a week. We agreed to continue checking BPs at home as long as it is less than 140s okay, will see endocrine in April, if at that time her BP is elevated she will call, will most likely increase amlodipine to 10 mg daily. DM: Is her observation that CBGs are slightly higher since she start amlodipine, they will be unusual.  Recommend to see Endo as planned. Anxiety insomnia: Still the stress of a Covid, declined add any medication to Xanax which she takes rarely. Preventive care: Again declined CCS Got 2 Covid shots RTC already scheduled for June  This visit occurred during the SARS-CoV-2 public health emergency.  Safety protocols were in place, including screening questions prior to the visit, additional usage of staff PPE, and extensive cleaning of exam room while observing appropriate contact time as indicated for disinfecting solutions.

## 2019-11-18 NOTE — Assessment & Plan Note (Signed)
HTN: BP was elevated when she came to the office in January, amlodipine was added, ambulatory BPs w/ wrist cuff: 120s/77, with arm cuff the 140s.  BP today is elevated when she arrived, I rechecked: 160/80 on the left arm. Reported that she is eating low-salt, not taking NSAIDs, takes a walk 3 times a week. We agreed to continue checking BPs at home as long as it is less than 140s okay, will see endocrine in April, if at that time her BP is elevated she will call, will most likely increase amlodipine to 10 mg daily. DM: Is her observation that CBGs are slightly higher since she start amlodipine, they will be unusual.  Recommend to see Endo as planned. Anxiety insomnia: Still the stress of a Covid, declined add any medication to Xanax which she takes rarely. Preventive care: Again declined CCS Got 2 Covid shots RTC already scheduled for June

## 2019-11-25 ENCOUNTER — Other Ambulatory Visit: Payer: Self-pay | Admitting: Internal Medicine

## 2019-12-06 DIAGNOSIS — E119 Type 2 diabetes mellitus without complications: Secondary | ICD-10-CM | POA: Diagnosis not present

## 2019-12-06 DIAGNOSIS — Z961 Presence of intraocular lens: Secondary | ICD-10-CM | POA: Diagnosis not present

## 2019-12-06 LAB — HM DIABETES EYE EXAM

## 2019-12-15 DIAGNOSIS — E1165 Type 2 diabetes mellitus with hyperglycemia: Secondary | ICD-10-CM | POA: Diagnosis not present

## 2019-12-15 DIAGNOSIS — E7800 Pure hypercholesterolemia, unspecified: Secondary | ICD-10-CM | POA: Diagnosis not present

## 2019-12-15 LAB — BASIC METABOLIC PANEL
BUN: 17 (ref 4–21)
CO2: 27 — AB (ref 13–22)
Chloride: 104 (ref 99–108)
Creatinine: 0.9 (ref 0.5–1.1)
Glucose: 218
Potassium: 4.8 (ref 3.4–5.3)
Sodium: 141 (ref 137–147)

## 2019-12-15 LAB — COMPREHENSIVE METABOLIC PANEL
Calcium: 9.8 (ref 8.7–10.7)
GFR calc Af Amer: 79.16
GFR calc non Af Amer: 65.42

## 2019-12-15 LAB — LIPID PANEL
Cholesterol: 170 (ref 0–200)
HDL: 45 (ref 35–70)
LDL Cholesterol: 35
LDl/HDL Ratio: 2
Triglycerides: 177 — AB (ref 40–160)

## 2019-12-15 LAB — HEPATIC FUNCTION PANEL
ALT: 33 (ref 7–35)
AST: 14 (ref 13–35)
Alkaline Phosphatase: 91 (ref 25–125)

## 2019-12-15 LAB — HEMOGLOBIN A1C: Hemoglobin A1C: 8.5

## 2019-12-15 LAB — MICROALBUMIN, URINE: Microalb, Ur: 56.7

## 2019-12-22 DIAGNOSIS — I10 Essential (primary) hypertension: Secondary | ICD-10-CM | POA: Diagnosis not present

## 2019-12-22 DIAGNOSIS — E1165 Type 2 diabetes mellitus with hyperglycemia: Secondary | ICD-10-CM | POA: Diagnosis not present

## 2019-12-22 DIAGNOSIS — R809 Proteinuria, unspecified: Secondary | ICD-10-CM | POA: Diagnosis not present

## 2019-12-22 DIAGNOSIS — E7800 Pure hypercholesterolemia, unspecified: Secondary | ICD-10-CM | POA: Diagnosis not present

## 2019-12-28 ENCOUNTER — Encounter: Payer: Self-pay | Admitting: Internal Medicine

## 2019-12-29 ENCOUNTER — Encounter: Payer: Self-pay | Admitting: Internal Medicine

## 2020-01-10 ENCOUNTER — Telehealth: Payer: Self-pay

## 2020-01-10 MED ORDER — ALPRAZOLAM 0.5 MG PO TABS: ORAL_TABLET | ORAL | 1 refills | Status: DC

## 2020-01-10 NOTE — Telephone Encounter (Signed)
PDMP okay, RF sent 

## 2020-01-10 NOTE — Telephone Encounter (Signed)
Alprazolam refill.   Last OV: 11/17/2019 Last Fill: 03/24/2019 #100 and 0RF Pt sig: 1 tab tid prn, and 2 at bedtime prn UDS: 06/07/2018 Low risk  Needs UDS at next OV

## 2020-01-14 ENCOUNTER — Other Ambulatory Visit: Payer: Self-pay | Admitting: Internal Medicine

## 2020-01-14 DIAGNOSIS — I10 Essential (primary) hypertension: Secondary | ICD-10-CM

## 2020-02-14 ENCOUNTER — Encounter: Payer: Self-pay | Admitting: Internal Medicine

## 2020-02-14 ENCOUNTER — Ambulatory Visit (INDEPENDENT_AMBULATORY_CARE_PROVIDER_SITE_OTHER): Payer: Medicare Other | Admitting: Internal Medicine

## 2020-02-14 ENCOUNTER — Other Ambulatory Visit: Payer: Self-pay

## 2020-02-14 VITALS — BP 152/79 | HR 74 | Temp 96.4°F | Resp 18 | Ht 66.0 in | Wt 171.4 lb

## 2020-02-14 DIAGNOSIS — E118 Type 2 diabetes mellitus with unspecified complications: Secondary | ICD-10-CM | POA: Diagnosis not present

## 2020-02-14 DIAGNOSIS — I350 Nonrheumatic aortic (valve) stenosis: Secondary | ICD-10-CM

## 2020-02-14 DIAGNOSIS — I10 Essential (primary) hypertension: Secondary | ICD-10-CM

## 2020-02-14 NOTE — Patient Instructions (Signed)
Continue checking your blood pressures BP GOAL is between 110/65 and  135/85. If it is consistently higher or lower, let me know     GO TO THE FRONT DESK, PLEASE SCHEDULE YOUR APPOINTMENTS Come back for   a physical exam in 4 to 5 months

## 2020-02-14 NOTE — Progress Notes (Signed)
Pre visit review using our clinic review tool, if applicable. No additional management support is needed unless otherwise documented below in the visit note. 

## 2020-02-14 NOTE — Progress Notes (Signed)
Subjective:    Patient ID: Nicole Duran, female    DOB: Jun 12, 1951, 69 y.o.   MRN: [id number]  DOS:  02/14/2020 Type of visit - description: follow up Since the last office visit she is doing well. Overall she feels better physically and mentally due to less restrictions related to the COVID-19 pandemia    Review of Systems Denies chest pain, difficulty breathing or lower extremity edema  Past Medical History:  Diagnosis Date  . ALLERGIC RHINITIS 04/22/2007  . Anxiety   . 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 HYPERLIPIDEMIA 04/22/2007  . Insomnia   . Kidney stone   . Menopause   . Metabolic syndrome     Past Surgical History:  Procedure Laterality Date  . cataracts Bilateral 06/15 11/15  . CHOLECYSTECTOMY      Allergies as of 02/14/2020      Reactions   Cefuroxime Axetil Hives, Shortness Of Breath, Itching   Ceftin   Amoxicillin-pot Clavulanate Nausea And Vomiting   Ezetimibe-simvastatin Nausea Only   Moxifloxacin Other (See Comments)   REACTION: pt states made her "deathly sick" -- nose bleeds Avelox   Nabumetone Nausea Only   Nitrofurantoin Nausea And Vomiting   Sulfonamide Derivatives Nausea Only   Prednisone Rash      Medication List       Accurate as of February 14, 2020  9:06 AM. If you have any questions, ask your nurse or doctor.        ALPRAZolam 0.5 MG tablet Commonly known as: XANAX TAKE 1 TABLET BY MOUTH THREE TIMES DAILY AS NEEDED FOR ANXIETY AND 2 AT BEDTIME AS NEEDED   amLODipine 5 MG tablet Commonly known as: NORVASC Take 1 tablet (5 mg total) by mouth daily.   aspirin 81 MG tablet Take 81 mg by mouth daily.   atorvastatin 20 MG tablet Commonly known as: LIPITOR Take 1 tablet (20 mg total) by mouth at bedtime.   azelastine 0.1 % nasal spray Commonly known as: ASTELIN Place 2 sprays into both nostrils at bedtime as needed for rhinitis or allergies.   BENADRYL PO Take by mouth as needed. Reported on 02/14/2016   ibuprofen 200 MG tablet Commonly  known as: ADVIL Take 200 mg by mouth every 6 (six) hours as needed.   Janumet XR [phone] MG Tb24 Generic drug: SitaGLIPtin-MetFORMIN HCl Take 1 tablet by mouth daily.   NASACORT AQ NA Place into the nose. Daily   ONE TOUCH ULTRA 2 w/Device Kit Use once daily to check blood sugar   ONE TOUCH ULTRA TEST test strip Generic drug: glucose blood USE 1 STRIP DAILY AS DIRECTED.   OneTouch Delica Lancets 10F Misc Check blood sugar twice daily   promethazine-codeine 6.25-10 MG/5ML syrup Commonly known as: PHENERGAN with CODEINE Take 5 mLs by mouth every 8 (eight) hours as needed for cough.   Vitamin D (Cholecalciferol) 25 MCG (1000 UT) Tabs Take 400 mg by mouth daily.   ZYRTEC PO Take 1 tablet by mouth daily as needed.          Objective:   Physical Exam BP (!) 152/79 (BP Location: Left Arm, Patient Position: Sitting, Cuff Size: Small)   Pulse 74   Temp (!) 96.4 F (35.8 C) (Temporal)   Resp 18   Ht _0  (1.676 m)   Wt 171 lb 6 oz (77.7 kg)   SpO2 97%   BMI 27.66 kg/m  General:   Well developed, NAD, BMI noted. HEENT:  Normocephalic . Face symmetric, atraumatic Lungs:  CTA B Normal respiratory effort, no intercostal retractions, no accessory muscle use. Heart: RRR, soft systolic murmur Lower extremities: no pretibial edema bilaterally  Skin: Not pale. Not jaundice Neurologic:  alert & oriented X3.  Speech normal, gait appropriate for age and unassisted Psych--  Cognition and judgment appear intact.  Cooperative with normal attention span and concentration.  Behavior appropriate. No anxious or depressed appearing.      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 Menopausal H/o Kidney stones  PLAN:  HTN: Continue with amlodipine, ambulatory BPs consistently 125, 130 and occasionally 140.  No edema.  Last BMP  satisfactory.  No change. EKG today: NSR, no change from previous DM: Per Dr. Chalmers Cater Hyperlipidemia: Last LDL was 35 (April 2021) Aortic stenosis: Echo 2015, has mild murmur, asymptomatic. Preventive care: Recommend a flu shot this season Aspirin: Not taking daily, okay to stop due to current literature recommendations RTC CPX 4 to 5 months   This visit occurred during the SARS-CoV-2 public health emergency.  Safety protocols were in place, including screening questions prior to the visit, additional usage of staff PPE, and extensive cleaning of exam room while observing appropriate contact time as indicated for disinfecting solutions.

## 2020-02-15 NOTE — Assessment & Plan Note (Signed)
HTN: Continue with amlodipine, ambulatory BPs consistently 125, 130 and occasionally 140.  No edema.  Last BMP satisfactory.  No change. EKG today: NSR, no change from previous DM: Per Dr. Chalmers Cater Hyperlipidemia: Last LDL was 35 (April 2021) Aortic stenosis: Echo 2015, has mild murmur, asymptomatic. Preventive care: Recommend a flu shot this season Aspirin: Not taking daily, okay to stop due to current literature recommendations RTC CPX 4 to 5 months

## 2020-03-13 ENCOUNTER — Encounter: Payer: Self-pay | Admitting: Internal Medicine

## 2020-03-20 DIAGNOSIS — E1165 Type 2 diabetes mellitus with hyperglycemia: Secondary | ICD-10-CM | POA: Diagnosis not present

## 2020-03-20 DIAGNOSIS — E7800 Pure hypercholesterolemia, unspecified: Secondary | ICD-10-CM | POA: Diagnosis not present

## 2020-03-20 LAB — BASIC METABOLIC PANEL
BUN: 15 (ref 4–21)
CO2: 24 — AB (ref 13–22)
Chloride: 101 (ref 99–108)
Creatinine: 0.9 (ref 0.5–1.1)
Glucose: 156
Potassium: 4.7 (ref 3.4–5.3)
Sodium: 138 (ref 137–147)

## 2020-03-20 LAB — COMPREHENSIVE METABOLIC PANEL
Calcium: 9.9 (ref 8.7–10.7)
GFR calc Af Amer: 78
GFR calc non Af Amer: 67

## 2020-03-20 LAB — HEMOGLOBIN A1C: Hemoglobin A1C: 7.5

## 2020-03-27 DIAGNOSIS — E1165 Type 2 diabetes mellitus with hyperglycemia: Secondary | ICD-10-CM | POA: Diagnosis not present

## 2020-03-27 DIAGNOSIS — R809 Proteinuria, unspecified: Secondary | ICD-10-CM | POA: Diagnosis not present

## 2020-03-27 DIAGNOSIS — I10 Essential (primary) hypertension: Secondary | ICD-10-CM | POA: Diagnosis not present

## 2020-03-27 DIAGNOSIS — E7800 Pure hypercholesterolemia, unspecified: Secondary | ICD-10-CM | POA: Diagnosis not present

## 2020-03-27 DIAGNOSIS — R011 Cardiac murmur, unspecified: Secondary | ICD-10-CM | POA: Diagnosis not present

## 2020-03-27 LAB — HM DIABETES FOOT EXAM

## 2020-04-10 DIAGNOSIS — Z1231 Encounter for screening mammogram for malignant neoplasm of breast: Secondary | ICD-10-CM | POA: Diagnosis not present

## 2020-04-10 LAB — HM MAMMOGRAPHY

## 2020-04-18 ENCOUNTER — Encounter: Payer: Self-pay | Admitting: Internal Medicine

## 2020-05-09 ENCOUNTER — Other Ambulatory Visit: Payer: Self-pay | Admitting: Internal Medicine

## 2020-05-09 DIAGNOSIS — I10 Essential (primary) hypertension: Secondary | ICD-10-CM

## 2020-05-12 ENCOUNTER — Other Ambulatory Visit: Payer: Self-pay | Admitting: Internal Medicine

## 2020-05-28 ENCOUNTER — Ambulatory Visit: Payer: Self-pay | Admitting: *Deleted

## 2020-05-29 ENCOUNTER — Other Ambulatory Visit: Payer: Self-pay | Admitting: Obstetrics and Gynecology

## 2020-05-29 DIAGNOSIS — Z13820 Encounter for screening for osteoporosis: Secondary | ICD-10-CM

## 2020-06-01 ENCOUNTER — Ambulatory Visit (INDEPENDENT_AMBULATORY_CARE_PROVIDER_SITE_OTHER): Payer: Medicare Other

## 2020-06-01 ENCOUNTER — Other Ambulatory Visit: Payer: Self-pay

## 2020-06-01 DIAGNOSIS — Z23 Encounter for immunization: Secondary | ICD-10-CM | POA: Diagnosis not present

## 2020-06-08 ENCOUNTER — Encounter: Payer: Self-pay | Admitting: Internal Medicine

## 2020-06-14 DIAGNOSIS — L814 Other melanin hyperpigmentation: Secondary | ICD-10-CM | POA: Diagnosis not present

## 2020-06-14 DIAGNOSIS — Z85828 Personal history of other malignant neoplasm of skin: Secondary | ICD-10-CM | POA: Diagnosis not present

## 2020-06-14 DIAGNOSIS — L820 Inflamed seborrheic keratosis: Secondary | ICD-10-CM | POA: Diagnosis not present

## 2020-06-14 DIAGNOSIS — L821 Other seborrheic keratosis: Secondary | ICD-10-CM | POA: Diagnosis not present

## 2020-06-14 DIAGNOSIS — L570 Actinic keratosis: Secondary | ICD-10-CM | POA: Diagnosis not present

## 2020-07-17 ENCOUNTER — Other Ambulatory Visit: Payer: Self-pay

## 2020-07-17 ENCOUNTER — Encounter: Payer: Self-pay | Admitting: Internal Medicine

## 2020-07-17 ENCOUNTER — Ambulatory Visit (INDEPENDENT_AMBULATORY_CARE_PROVIDER_SITE_OTHER): Payer: Medicare Other | Admitting: Internal Medicine

## 2020-07-17 VITALS — BP 112/66 | HR 57 | Temp 97.9°F | Resp 16 | Ht 66.0 in | Wt 164.2 lb

## 2020-07-17 DIAGNOSIS — E118 Type 2 diabetes mellitus with unspecified complications: Secondary | ICD-10-CM | POA: Diagnosis not present

## 2020-07-17 DIAGNOSIS — I350 Nonrheumatic aortic (valve) stenosis: Secondary | ICD-10-CM

## 2020-07-17 DIAGNOSIS — G4700 Insomnia, unspecified: Secondary | ICD-10-CM

## 2020-07-17 DIAGNOSIS — R7989 Other specified abnormal findings of blood chemistry: Secondary | ICD-10-CM | POA: Diagnosis not present

## 2020-07-17 DIAGNOSIS — Z79899 Other long term (current) drug therapy: Secondary | ICD-10-CM | POA: Diagnosis not present

## 2020-07-17 DIAGNOSIS — Z0000 Encounter for general adult medical examination without abnormal findings: Secondary | ICD-10-CM | POA: Diagnosis not present

## 2020-07-17 DIAGNOSIS — F411 Generalized anxiety disorder: Secondary | ICD-10-CM | POA: Diagnosis not present

## 2020-07-17 DIAGNOSIS — E785 Hyperlipidemia, unspecified: Secondary | ICD-10-CM

## 2020-07-17 LAB — CBC WITH DIFFERENTIAL/PLATELET
Basophils Absolute: 0 10*3/uL (ref 0.0–0.1)
Basophils Relative: 0.6 % (ref 0.0–3.0)
Eosinophils Absolute: 0.1 10*3/uL (ref 0.0–0.7)
Eosinophils Relative: 1.4 % (ref 0.0–5.0)
HCT: 47.1 % — ABNORMAL HIGH (ref 36.0–46.0)
Hemoglobin: 15.4 g/dL — ABNORMAL HIGH (ref 12.0–15.0)
Lymphocytes Relative: 19.4 % (ref 12.0–46.0)
Lymphs Abs: 1.6 10*3/uL (ref 0.7–4.0)
MCHC: 32.8 g/dL (ref 30.0–36.0)
MCV: 92.2 fl (ref 78.0–100.0)
Monocytes Absolute: 0.4 10*3/uL (ref 0.1–1.0)
Monocytes Relative: 4.7 % (ref 3.0–12.0)
Neutro Abs: 6.2 10*3/uL (ref 1.4–7.7)
Neutrophils Relative %: 73.9 % (ref 43.0–77.0)
Platelets: 269 10*3/uL (ref 150.0–400.0)
RBC: 5.1 Mil/uL (ref 3.87–5.11)
RDW: 14.9 % (ref 11.5–15.5)
WBC: 8.4 10*3/uL (ref 4.0–10.5)

## 2020-07-17 LAB — BASIC METABOLIC PANEL
BUN: 13 mg/dL (ref 6–23)
CO2: 27 mEq/L (ref 19–32)
Calcium: 9.8 mg/dL (ref 8.4–10.5)
Chloride: 102 mEq/L (ref 96–112)
Creatinine, Ser: 0.76 mg/dL (ref 0.40–1.20)
GFR: 78.73 mL/min (ref >60.00)
Glucose, Bld: 156 mg/dL — ABNORMAL HIGH (ref 70–99)
Potassium: 4.4 mEq/L (ref 3.5–5.1)
Sodium: 137 mEq/L (ref 135–145)

## 2020-07-17 LAB — T4, FREE: Free T4: 0.92 ng/dL (ref 0.60–1.60)

## 2020-07-17 LAB — TSH: TSH: 2.5 u[IU]/mL (ref 0.35–4.50)

## 2020-07-17 MED ORDER — AZELASTINE HCL 0.1 % NA SOLN
2.0000 | Freq: Two times a day (BID) | NASAL | 5 refills | Status: DC
Start: 2020-07-17 — End: 2021-09-20

## 2020-07-17 NOTE — Progress Notes (Signed)
Pre visit review using our clinic review tool, if applicable. No additional management support is needed unless otherwise documented below in the visit note. 

## 2020-07-17 NOTE — Patient Instructions (Addendum)
Check the  blood pressure monthly BP GOAL is between 110/65 and  135/85. If it is consistently higher or lower, let me know  Increase Astelin to twice a day, if your allergies are no better let me know  GO TO THE LAB : Get the blood work     [place], [place] back for a physical exam in 1 year   LEARN ABOUT CALCIUM AND VITAMIN D  Calcium and Vitamin D are important to help keep your bones healthy and prevent osteoporosis. Healthy calcium and Vitamin D levels can be reached by increasing calcium and vitamin D in your diet, or by taking supplements over the counter.  The current recommendations are as follows:  Postmenopausal women:    - 1200mg  calcium and 800 to 2000 units vitamin D daily                         - One Multivitamin a day

## 2020-07-17 NOTE — Progress Notes (Signed)
Subjective:    Patient ID: Nicole Duran, female    DOB: 03-Mar-1951, 69 y.o.   MRN: [id number]  DOS:  07/17/2020 Type of visit - description: cpx Since the last office visit is doing well. Did report on and off nasal congestion, sinus headaches, she thinks is allergies. No fever chills or cough.   Review of Systems  Other than above, a 14 point review of systems is negative      Past Medical History:  Diagnosis Date  . ALLERGIC RHINITIS 04/22/2007  . Anxiety   . 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 HYPERLIPIDEMIA 04/22/2007  . Insomnia   . Kidney stone   . Menopause   . Metabolic syndrome     Past Surgical History:  Procedure Laterality Date  . cataracts Bilateral 06/15 11/15  . CHOLECYSTECTOMY      Allergies as of 07/17/2020      Reactions   Cefuroxime Axetil Hives, Shortness Of Breath, Itching   Ceftin   Amoxicillin-pot Clavulanate Nausea And Vomiting   Ezetimibe-simvastatin Nausea Only   Moxifloxacin Other (See Comments)   REACTION: pt states made her "deathly sick" -- nose bleeds Avelox   Nabumetone Nausea Only   Nitrofurantoin Nausea And Vomiting   Sulfonamide Derivatives Nausea Only   Prednisone Rash      Medication List       Accurate as of July 17, 2020 11:59 PM. If you have any questions, ask your nurse or doctor.        ALPRAZolam 0.5 MG tablet Commonly known as: XANAX TAKE 1 TABLET BY MOUTH THREE TIMES DAILY AS NEEDED FOR ANXIETY AND 2 AT BEDTIME AS NEEDED   amLODipine 5 MG tablet Commonly known as: NORVASC Take 1 tablet (5 mg total) by mouth daily.   atorvastatin 20 MG tablet Commonly known as: LIPITOR Take 1 tablet (20 mg total) by mouth at bedtime.   azelastine 0.1 % nasal spray Commonly known as: ASTELIN Place 2 sprays into both nostrils 2 (two) times daily. What changed:   when to take this  reasons to take this Changed by: Kathlene November, MD   BENADRYL PO Take by mouth as needed. Reported on 02/14/2016   ibuprofen 200 MG  tablet Commonly known as: ADVIL Take 200 mg by mouth every 6 (six) hours as needed.   Janumet XR [phone] MG Tb24 Generic drug: SitaGLIPtin-MetFORMIN HCl Take 1 tablet by mouth daily.   NASACORT AQ NA Place into the nose. Daily   ONE TOUCH ULTRA 2 w/Device Kit Use once daily to check blood sugar   ONE TOUCH ULTRA TEST test strip Generic drug: glucose blood USE 1 STRIP DAILY AS DIRECTED.   OneTouch Delica Lancets 56O Misc Check blood sugar twice daily   Vitamin D (Cholecalciferol) 25 MCG (1000 UT) Tabs Take 400 mg by mouth daily.   ZYRTEC PO Take 1 tablet by mouth daily as needed.          Objective:   Physical Exam BP 112/66 (BP Location: Right Arm, Patient Position: Sitting, Cuff Size: Small)   Pulse (!) 57   Temp 97.9 F (36.6 C) (Oral)   Resp 16   Ht '5\' 6"'  (1.676 m)   Wt 164 lb 4 oz (74.5 kg)   SpO2 98%   BMI 26.51 kg/m  General: Well developed, NAD, BMI noted Neck: No  thyromegaly  HEENT:  Normocephalic . Face symmetric, atraumatic. TMs normal Nose: Turbinate slightly edematous Lungs:  CTA B Normal respiratory effort, no intercostal retractions, no accessory  muscle use. Heart: RRR, + systolic murmur.  Abdomen:  Not distended, soft, non-tender. No rebound or rigidity.   Lower extremities: no pretibial edema bilaterally  Skin: Exposed areas without rash. Not pale. Not jaundice Neurologic:  alert & oriented X3.  Speech normal, gait appropriate for age and unassisted Strength symmetric and appropriate for age.  Psych: Cognition and judgment appear intact.  Cooperative with normal attention span and concentration.  Behavior appropriate. No anxious or depressed appearing.     Assessment    Assessment  HTN: DX 09-2019 DM  Dx 2009--per endo Dr Chalmers Cater Hyperlipidemia Anxiety, insomnia-- xanax per pcp CV: --Ao stenosis mild/mod grade 2 D disfx ECHO 10/2013. Menopausal H/o Kidney stones  PLAN:  [id number] here, good ambulatory BPs,  continue amlodipine. DM: Per Endo Hyperlipidemia: On Lipitor, last FLP very good. Anxiety, insomnia: On Xanax, takes it sporadically, contract signed. Allergies, allergic rhinitis: Symptoms not completely well controlled, continue antihistamines, nasal steroids and increase Astelin to twice daily, if not better consider ENT referral, on exam turbinates are enlarged. Aortic valve stenosis: Active, doing yard work, no symptoms, murmur seems to be slightly more noticeable.  Last echo 2015, recheck echo. RTC 1 year  In addition to CPX, we discussed all her chronic medical problems and ordered appropriate labs.  This visit occurred during the SARS-CoV-2 public health emergency.  Safety protocols were in place, including screening questions prior to the visit, additional usage of staff PPE, and extensive cleaning of exam room while observing appropriate contact time as indicated for disinfecting solutions.

## 2020-07-18 ENCOUNTER — Encounter: Payer: Self-pay | Admitting: Internal Medicine

## 2020-07-18 NOTE — Assessment & Plan Note (Signed)
-   Td 2015 -zostavax 2013; shingrix pending  - PNM 23:  2014, offered booster, declined; prevnar 12-2014 - covid vax x 3  - had a flu shot  - CCS: no previous screenings, not interested, benefits discussed - female care PAP 2019,  neg MMG 04/2020 ([place]) DEXA: per gyn -Labs: BMP, CBC, TSH, T4 -Diet, exercise, calcium and vitamin D supplements discussed

## 2020-07-18 NOTE — Assessment & Plan Note (Signed)
DOD:[id number] here, good ambulatory BPs, continue amlodipine. DM: Per Endo Hyperlipidemia: On Lipitor, last FLP very good. Anxiety, insomnia: On Xanax, takes it sporadically, contract signed. Allergies, allergic rhinitis: Symptoms not completely well controlled, continue antihistamines, nasal steroids and increase Astelin to twice daily, if not better consider ENT referral, on exam turbinates are enlarged. Aortic valve stenosis: Active, doing yard work, no symptoms, murmur seems to be slightly more noticeable.  Last echo 2015, recheck echo. RTC 1 year

## 2020-07-19 LAB — DRUG MONITORING, PANEL 8 WITH CONFIRMATION, URINE
6 Acetylmorphine: NEGATIVE ng/mL (ref <10)
Alcohol Metabolites: NEGATIVE ng/mL
Alphahydroxyalprazolam: 64 ng/mL — ABNORMAL HIGH (ref <25)
Alphahydroxymidazolam: NEGATIVE ng/mL (ref <50)
Alphahydroxytriazolam: NEGATIVE ng/mL (ref <50)
Aminoclonazepam: NEGATIVE ng/mL (ref <25)
Amphetamines: NEGATIVE ng/mL (ref <500)
Benzodiazepines: POSITIVE ng/mL — AB (ref <100)
Buprenorphine, Urine: NEGATIVE ng/mL (ref <5)
Cocaine Metabolite: NEGATIVE ng/mL (ref <150)
Creatinine: 75.7 mg/dL
Hydroxyethylflurazepam: NEGATIVE ng/mL (ref <50)
Lorazepam: NEGATIVE ng/mL (ref <50)
MDMA: NEGATIVE ng/mL (ref <500)
Marijuana Metabolite: NEGATIVE ng/mL (ref <20)
Nordiazepam: NEGATIVE ng/mL (ref <50)
Opiates: NEGATIVE ng/mL (ref <100)
Oxazepam: NEGATIVE ng/mL (ref <50)
Oxidant: NEGATIVE ug/mL
Oxycodone: NEGATIVE ng/mL (ref <100)
Temazepam: NEGATIVE ng/mL (ref <50)
pH: 5.7 (ref 4.5–9.0)

## 2020-07-19 LAB — DM TEMPLATE

## 2020-07-20 ENCOUNTER — Telehealth (HOSPITAL_COMMUNITY): Payer: Self-pay | Admitting: Internal Medicine

## 2020-07-20 NOTE — Telephone Encounter (Signed)
Noted, thank you.  I will discuss with her at the next opportunity

## 2020-07-20 NOTE — Telephone Encounter (Signed)
I called patient to schedule echocardiogram and she declined to do so at this time and will call us back when ready. Order will be removed from the  Echo WQ.

## 2020-07-31 DIAGNOSIS — R809 Proteinuria, unspecified: Secondary | ICD-10-CM | POA: Diagnosis not present

## 2020-07-31 DIAGNOSIS — I10 Essential (primary) hypertension: Secondary | ICD-10-CM | POA: Diagnosis not present

## 2020-07-31 DIAGNOSIS — E1165 Type 2 diabetes mellitus with hyperglycemia: Secondary | ICD-10-CM | POA: Diagnosis not present

## 2020-07-31 DIAGNOSIS — E7800 Pure hypercholesterolemia, unspecified: Secondary | ICD-10-CM | POA: Diagnosis not present

## 2020-07-31 LAB — HEMOGLOBIN A1C: Hemoglobin A1C: 7.3

## 2020-08-06 ENCOUNTER — Other Ambulatory Visit: Payer: Self-pay | Admitting: Internal Medicine

## 2020-08-09 ENCOUNTER — Telehealth: Payer: Self-pay | Admitting: Internal Medicine

## 2020-08-09 NOTE — Telephone Encounter (Signed)
Needs an appt

## 2020-08-09 NOTE — Telephone Encounter (Signed)
Caller: Nicole Duran [phone]   Caller states she has had congestion for past week....  Cough, drainage, sinus pressure  She has been  Taking mucinex & robitussin not working   Would like paz to send something in for her

## 2020-08-10 ENCOUNTER — Other Ambulatory Visit: Payer: Self-pay

## 2020-08-10 ENCOUNTER — Telehealth (INDEPENDENT_AMBULATORY_CARE_PROVIDER_SITE_OTHER): Payer: Medicare Other | Admitting: Internal Medicine

## 2020-08-10 ENCOUNTER — Encounter: Payer: Self-pay | Admitting: Internal Medicine

## 2020-08-10 VITALS — HR 78 | Temp 97.3°F | Ht 66.0 in | Wt 164.0 lb

## 2020-08-10 DIAGNOSIS — J209 Acute bronchitis, unspecified: Secondary | ICD-10-CM | POA: Diagnosis not present

## 2020-08-10 MED ORDER — AZITHROMYCIN 250 MG PO TABS: ORAL_TABLET | ORAL | 0 refills | Status: DC

## 2020-08-10 MED ORDER — PROMETHAZINE-CODEINE 6.25-10 MG/5ML PO SYRP: 5.0000 mL | ORAL_SOLUTION | Freq: Three times a day (TID) | ORAL | 0 refills | Status: DC | PRN

## 2020-08-10 MED ORDER — PROMETHAZINE-CODEINE 6.25-10 MG/5ML PO SOLN: 5.0000 mL | Freq: Three times a day (TID) | ORAL | 0 refills | Status: DC | PRN

## 2020-08-10 NOTE — Progress Notes (Signed)
Subjective:    Patient ID: Nicole Duran, female    DOB: [birth year], 69 y.o.   MRN: [id number]  DOS:  08/10/2020 Type of visit - description: Virtual Visit via Video Note  I connected with the above patient  by a video enabled telemedicine application and verified that I am speaking with the correct person using two identifiers.   THIS ENCOUNTER IS A VIRTUAL VISIT DUE TO COVID-19 - PATIENT WAS NOT SEEN IN THE OFFICE. PATIENT HAS CONSENTED TO VIRTUAL VISIT / TELEMEDICINE VISIT   Location of patient: home  Location of provider: office  Persons participating in the virtual visit: patient, provider   I discussed the limitations of evaluation and management by telemedicine and the availability of in person appointments. The patient expressed understanding and agreed to proceed.  Acute Symptoms a started 6 days ago: Cough, chest congestion, occasional green sputum. Also sinus congestion,  blowing some discharge. Overall feels better but would like a refill for codeine to help with lingering cough.  Denies fever chills. O2 sats at home but normal. No chest pain no difficulty breathing No wheezing per se just some "rattling". No nausea or vomiting.    Review of Systems See above   Past Medical History:  Diagnosis Date  . ALLERGIC RHINITIS 04/22/2007  . Anxiety   . 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 HYPERLIPIDEMIA 04/22/2007  . Insomnia   . Kidney stone   . Menopause   . Metabolic syndrome     Past Surgical History:  Procedure Laterality Date  . cataracts Bilateral 06/15 11/15  . CHOLECYSTECTOMY      Allergies as of 08/10/2020      Reactions   Cefuroxime Axetil Hives, Shortness Of Breath, Itching   Ceftin   Amoxicillin-pot Clavulanate Nausea And Vomiting   Ezetimibe-simvastatin Nausea Only   Moxifloxacin Other (See Comments)   REACTION: pt states made her "deathly sick" -- nose bleeds Avelox   Nabumetone Nausea Only   Nitrofurantoin Nausea And Vomiting   Sulfonamide  Derivatives Nausea Only   Prednisone Rash      Medication List       Accurate as of August 10, 2020 10:24 AM. If you have any questions, ask your nurse or doctor.        ALPRAZolam 0.5 MG tablet Commonly known as: XANAX TAKE 1 TABLET BY MOUTH THREE TIMES DAILY AS NEEDED FOR ANXIETY AND 2 AT BEDTIME AS NEEDED   amLODipine 5 MG tablet Commonly known as: NORVASC Take 1 tablet (5 mg total) by mouth daily.   atorvastatin 20 MG tablet Commonly known as: LIPITOR Take 1 tablet (20 mg total) by mouth at bedtime.   azelastine 0.1 % nasal spray Commonly known as: ASTELIN Place 2 sprays into both nostrils 2 (two) times daily.   BENADRYL PO Take by mouth as needed. Reported on 02/14/2016   ibuprofen 200 MG tablet Commonly known as: ADVIL Take 200 mg by mouth every 6 (six) hours as needed.   Janumet XR [phone] [id number] Tb24 Generic drug: SitaGLIPtin-MetFORMIN HCl Take 1 tablet by mouth daily.   NASACORT AQ NA Place into the nose. Daily   ONE TOUCH ULTRA 2 w/Device Kit Use once daily to check blood sugar   ONE TOUCH ULTRA TEST test strip Generic drug: glucose blood USE 1 STRIP DAILY AS DIRECTED.   OneTouch Delica Lancets 50V Misc Check blood sugar twice daily   Vitamin D (Cholecalciferol) 25 MCG (1000 UT) Tabs Take 400 mg by mouth daily.   ZYRTEC PO  Take 1 tablet by mouth daily as needed.          Objective:   Physical Exam Pulse 78   Temp (!) 97.3 F (36.3 C) (Oral)   Ht '5\' 6"'  (1.676 m)   Wt 164 lb (74.4 kg)   SpO2 98%   BMI 26.47 kg/m  This is a virtual video visit, she is alert oriented x3, in no distress.  Mild cough noted during the visit, some large airway congestion but no wheezing noted.    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 Menopausal H/o Kidney stones  PLAN:  URI: DDx includes a URI, mild  bronchitis, viral syndrome. Overall she feels better, likes to have better cough suppressant. She is fully vaccinated against Covid and had a flu shot. Nontoxic-appearing. Plan: See the message I sent to the patient: Please continue taking Mucinex DM and Astelin to help with cough and nasal congestion. I sent the prescription for promethazine-codeine, that is a stronger cough suppressant, you can take it up to 3 times a day, please be careful because it may cause some drowsiness. Drink plenty of fluids Tylenol if needed If you are not continue improving, start Zithromax in 2-3 days , the antibiotic I just sent; however if you are feeling better there is no need to start Zithromax. If you feel much worse, have high fever, chills, difficulty breathing: Go to the ER.         I discussed the assessment and treatment plan with the patient. The patient was provided an opportunity to ask questions and all were answered. The patient agreed with the plan and demonstrated an understanding of the instructions.   The patient was advised to call back or seek an in-person evaluation if the symptoms worsen or if the condition fails to improve as anticipated.

## 2020-08-10 NOTE — Progress Notes (Signed)
Pre visit review using our clinic review tool, if applicable. No additional management support is needed unless otherwise documented below in the visit note. 

## 2020-08-11 NOTE — Assessment & Plan Note (Signed)
URI: DDx includes a URI, mild bronchitis, viral syndrome. Overall she feels better, likes to have better cough suppressant. She is fully vaccinated against Covid and had a flu shot. Nontoxic-appearing. Plan: See the message I sent to the patient: Please continue taking Mucinex DM and Astelin to help with cough and nasal congestion. I sent the prescription for promethazine-codeine, that is a stronger cough suppressant, you can take it up to 3 times a day, please be careful because it may cause some drowsiness. Drink plenty of fluids Tylenol if needed If you are not continue improving, start Zithromax in 2-3 days , the antibiotic I just sent; however if you are feeling better there is no need to start Zithromax. If you feel much worse, have high fever, chills, difficulty breathing: Go to the ER.

## 2020-08-14 ENCOUNTER — Encounter: Payer: Self-pay | Admitting: Internal Medicine

## 2020-09-13 ENCOUNTER — Other Ambulatory Visit: Payer: Medicare Other

## 2020-09-25 ENCOUNTER — Telehealth: Payer: Self-pay | Admitting: Internal Medicine

## 2020-09-25 DIAGNOSIS — I10 Essential (primary) hypertension: Secondary | ICD-10-CM

## 2020-09-25 NOTE — Telephone Encounter (Signed)
Requesting: alprazolam 0.5mg  Contract: 07/17/2020 UDS: 07/17/2020 Last Visit: 08/10/2020 Next Visit: 07/19/2021 Last Refill: 01/10/2020 #100 and 1RF Pt sig: 1 tab tid prn, and 2 tab qhs prn  Please Advise

## 2020-09-25 NOTE — Telephone Encounter (Signed)
PDMP okay, prescription sent 

## 2020-10-08 ENCOUNTER — Telehealth: Payer: Self-pay | Admitting: Internal Medicine

## 2020-10-08 NOTE — Telephone Encounter (Signed)
Open in error

## 2020-12-13 DIAGNOSIS — E119 Type 2 diabetes mellitus without complications: Secondary | ICD-10-CM | POA: Diagnosis not present

## 2020-12-13 DIAGNOSIS — Z7984 Long term (current) use of oral hypoglycemic drugs: Secondary | ICD-10-CM | POA: Diagnosis not present

## 2020-12-13 DIAGNOSIS — Z961 Presence of intraocular lens: Secondary | ICD-10-CM | POA: Diagnosis not present

## 2020-12-13 LAB — HM DIABETES EYE EXAM

## 2020-12-25 ENCOUNTER — Ambulatory Visit (HOSPITAL_BASED_OUTPATIENT_CLINIC_OR_DEPARTMENT_OTHER)
Admission: RE | Admit: 2020-12-25 | Discharge: 2020-12-25 | Disposition: A | Discharge status: Home / Self Care | Payer: Medicare Other | Source: Ambulatory Visit | Attending: Internal Medicine | Admitting: Internal Medicine

## 2020-12-25 ENCOUNTER — Ambulatory Visit (INDEPENDENT_AMBULATORY_CARE_PROVIDER_SITE_OTHER): Payer: Medicare Other | Admitting: Internal Medicine

## 2020-12-25 ENCOUNTER — Encounter: Payer: Self-pay | Admitting: Internal Medicine

## 2020-12-25 ENCOUNTER — Other Ambulatory Visit (HOSPITAL_BASED_OUTPATIENT_CLINIC_OR_DEPARTMENT_OTHER): Payer: Self-pay

## 2020-12-25 ENCOUNTER — Other Ambulatory Visit: Payer: Self-pay

## 2020-12-25 VITALS — BP 162/66 | HR 89 | Temp 98.3°F | Resp 16 | Ht 66.0 in | Wt 163.1 lb

## 2020-12-25 DIAGNOSIS — M19072 Primary osteoarthritis, left ankle and foot: Secondary | ICD-10-CM | POA: Diagnosis not present

## 2020-12-25 DIAGNOSIS — L97529 Non-pressure chronic ulcer of other part of left foot with unspecified severity: Secondary | ICD-10-CM | POA: Insufficient documentation

## 2020-12-25 DIAGNOSIS — M545 Low back pain, unspecified: Secondary | ICD-10-CM

## 2020-12-25 DIAGNOSIS — M25561 Pain in right knee: Secondary | ICD-10-CM

## 2020-12-25 DIAGNOSIS — M25562 Pain in left knee: Secondary | ICD-10-CM

## 2020-12-25 DIAGNOSIS — Z872 Personal history of diseases of the skin and subcutaneous tissue: Secondary | ICD-10-CM | POA: Diagnosis not present

## 2020-12-25 DIAGNOSIS — E11621 Type 2 diabetes mellitus with foot ulcer: Secondary | ICD-10-CM

## 2020-12-25 MED ORDER — DOXYCYCLINE HYCLATE 100 MG PO TABS
100.0000 mg | ORAL_TABLET | Freq: Two times a day (BID) | ORAL | 0 refills | Status: DC
  Filled 2020-12-25: qty 14, 7d supply, fill #0

## 2020-12-25 NOTE — Patient Instructions (Addendum)
Per our records you are due for your diabetic eye exam. Please contact your eye doctor to schedule an appointment. Please have them send copies of your office visit notes to [place]. Our fax number is [phone]. If you need a referral to an eye doctor please let us know.  Take doxycycline, an antibiotic for 1 week  Keep the area clean and dry.  We are referring you to the wound care center  We are referring you to the [facility] orthopedic practice for your back pain  Please do the x-ray of your left foot downstairs  Check the  blood pressure regularly BP GOAL is between 110/65 and  135/85. If it is consistently higher or lower, let me know

## 2020-12-25 NOTE — Progress Notes (Signed)
Subjective:    Patient ID: Nicole Duran, female    DOB: March 16, 1951, 70 y.o.   MRN: [id number]  DOS:  12/25/2020 Type of visit - description: Acute visit  About 4 weeks ago, she noted area of pain and discomfort at the left foot. She had similar finding at the right foot (that  self resolved).  The area is TTP, there has been no openings, discharge or bleeding. No fever chills No injury.  She does not have a history of neuropathy, denies numbness, tingling or burning at the feet.  Also, low back pain for a while, some bilateral knee pain as well.  Request Ortho referral.  BP Readings from Last 3 Encounters:  12/25/20 (!) 162/66  07/17/20 112/66  02/14/20 (!) 152/79     Review of Systems See above   Past Medical History:  Diagnosis Date  . ALLERGIC RHINITIS 04/22/2007  . Anxiety   . 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 HYPERLIPIDEMIA 04/22/2007  . Insomnia   . Kidney stone   . Menopause   . Metabolic syndrome     Past Surgical History:  Procedure Laterality Date  . cataracts Bilateral 06/15 11/15  . CHOLECYSTECTOMY      Allergies as of 12/25/2020      Reactions   Cefuroxime Axetil Hives, Shortness Of Breath, Itching   Ceftin   Amoxicillin-pot Clavulanate Nausea And Vomiting   Ezetimibe-simvastatin Nausea Only   Moxifloxacin Other (See Comments)   REACTION: pt states made her "deathly sick" -- nose bleeds Avelox   Nabumetone Nausea Only   Nitrofurantoin Nausea And Vomiting   Sulfonamide Derivatives Nausea Only   Prednisone Rash      Medication List       Accurate as of December 25, 2020 11:59 PM. If you have any questions, ask your nurse or doctor.        ALPRAZolam 0.5 MG tablet Commonly known as: XANAX TAKE 1 TABLET BY MOUTH THREE TIMES DAILY AS NEEDED FOR ANXIETY AND 2 AT BEDTIME AS NEEDED   amLODipine 5 MG tablet Commonly known as: NORVASC Take 1 tablet (5 mg total) by mouth daily.   atorvastatin 20 MG tablet Commonly known as: LIPITOR Take 1 tablet (20 mg  total) by mouth at bedtime.   azelastine 0.1 % nasal spray Commonly known as: ASTELIN Place 2 sprays into both nostrils 2 (two) times daily.   azithromycin 250 MG tablet Commonly known as: Zithromax Z-Pak 2 tabs a day the first day, then 1 tab a day x 4 days   BENADRYL PO Take by mouth as needed. Reported on 02/14/2016   doxycycline 100 MG tablet Commonly known as: VIBRA-TABS Take 1 tablet (100 mg total) by mouth 2 (two) times daily. Started by: Kathlene November, MD   ibuprofen 200 MG tablet Commonly known as: ADVIL Take 200 mg by mouth every 6 (six) hours as needed.   Janumet XR [phone] MG Tb24 Generic drug: SitaGLIPtin-MetFORMIN HCl Take 1 tablet by mouth daily.   multivitamin with minerals Tabs tablet Take 1 tablet by mouth daily.   NASACORT AQ NA Place into the nose. Daily   ONE TOUCH ULTRA 2 w/Device Kit Use once daily to check blood sugar   ONE TOUCH ULTRA TEST test strip Generic drug: glucose blood USE 1 STRIP DAILY AS DIRECTED.   OneTouch Delica Lancets 06Y Misc Check blood sugar twice daily   Promethazine-Codeine 6.25-10 MG/5ML Soln Take 5 mLs by mouth 3 (three) times daily as needed.   Vitamin D (Cholecalciferol)  25 MCG (1000 UT) Tabs Take 400 mg by mouth daily.   ZYRTEC PO Take 1 tablet by mouth daily as needed.          Objective:   Physical Exam BP (!) 162/66 (BP Location: Right Arm, Patient Position: Sitting, Cuff Size: Normal)   Pulse 89   Temp 98.3 F (36.8 C) (Oral)   Resp 16   Ht '5\' 6"'  (1.676 m)   Wt 163 lb 2 oz (74 kg)   SpO2 98%   BMI 26.33 kg/m  General:   Well developed, NAD, BMI noted. HEENT:  Normocephalic . Face symmetric, atraumatic Lower extremities: R foot: No edema, pedal pulses present, pinprick examination normal L foot: No edema, pedal pulse present.  Pinprick examination normal. At the lateral aspect he has an area of mild redness and what seems to be a scab.  No fluctuance, no obvious abscess.  The area is TTP and a  slightly warm.  See picture. Skin: Not pale. Not jaundice Neurologic:  alert & oriented X3.  Speech normal, gait appropriate for age and unassisted Psych--  Cognition and judgment appear intact.  Cooperative with normal attention span and concentration.  Behavior appropriate. No anxious or depressed appearing.         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 Menopausal H/o Kidney stones  PLAN:  Developing foot ulcer, patient has diabetes. sxs as described above, no obvious abscess, plan: X-ray to rule out osteomyelitis , start doxycycline, referred to the wound care center.  Call if not better Back pain: Request referral to Ortho, will arrange. HTN: BP today slightly elevated, at home is in the 130s.  No change, continue monitoring.     This visit occurred during the SARS-CoV-2 public health emergency.  Safety protocols were in place, including screening questions prior to the visit, additional usage of staff PPE, and extensive cleaning of exam room while observing appropriate contact time as indicated for disinfecting solutions.

## 2020-12-26 NOTE — Assessment & Plan Note (Signed)
Developing foot ulcer, patient has diabetes. sxs as described above, no obvious abscess, plan: X-ray to rule out osteomyelitis , start doxycycline, referred to the wound care center.  Call if not better Back pain: Request referral to Ortho, will arrange. HTN: BP today slightly elevated, at home is in the 130s.  No change, continue monitoring.

## 2020-12-28 ENCOUNTER — Other Ambulatory Visit: Payer: Self-pay | Admitting: Sports Medicine

## 2020-12-28 DIAGNOSIS — M545 Low back pain, unspecified: Secondary | ICD-10-CM | POA: Diagnosis not present

## 2020-12-28 DIAGNOSIS — M25562 Pain in left knee: Secondary | ICD-10-CM | POA: Diagnosis not present

## 2020-12-28 DIAGNOSIS — M25561 Pain in right knee: Secondary | ICD-10-CM | POA: Diagnosis not present

## 2021-01-04 ENCOUNTER — Other Ambulatory Visit: Payer: Self-pay

## 2021-01-04 ENCOUNTER — Ambulatory Visit
Admission: RE | Admit: 2021-01-04 | Discharge: 2021-01-04 | Disposition: A | Discharge status: Home / Self Care | Payer: Medicare Other | Source: Ambulatory Visit | Attending: Obstetrics and Gynecology | Admitting: Obstetrics and Gynecology

## 2021-01-04 DIAGNOSIS — Z13820 Encounter for screening for osteoporosis: Secondary | ICD-10-CM

## 2021-01-04 DIAGNOSIS — Z780 Asymptomatic menopausal state: Secondary | ICD-10-CM | POA: Diagnosis not present

## 2021-01-04 DIAGNOSIS — M85852 Other specified disorders of bone density and structure, left thigh: Secondary | ICD-10-CM | POA: Diagnosis not present

## 2021-01-09 ENCOUNTER — Ambulatory Visit
Admission: RE | Admit: 2021-01-09 | Discharge: 2021-01-09 | Disposition: A | Discharge status: Home / Self Care | Payer: Medicare Other | Source: Ambulatory Visit | Attending: Sports Medicine | Admitting: Sports Medicine

## 2021-01-09 DIAGNOSIS — M545 Low back pain, unspecified: Secondary | ICD-10-CM

## 2021-01-14 DIAGNOSIS — M545 Low back pain, unspecified: Secondary | ICD-10-CM | POA: Diagnosis not present

## 2021-01-17 ENCOUNTER — Encounter (HOSPITAL_BASED_OUTPATIENT_CLINIC_OR_DEPARTMENT_OTHER): Payer: Medicare Other | Admitting: Internal Medicine

## 2021-01-31 DIAGNOSIS — E1165 Type 2 diabetes mellitus with hyperglycemia: Secondary | ICD-10-CM | POA: Diagnosis not present

## 2021-01-31 LAB — HEPATIC FUNCTION PANEL
ALT: 20 (ref 7–35)
AST: 15 (ref 13–35)
Alkaline Phosphatase: 97 (ref 25–125)
Bilirubin, Direct: 0.2 (ref 0.01–0.4)
Bilirubin, Total: 0.4

## 2021-01-31 LAB — MICROALBUMIN, URINE: Microalb, Ur: 0.5

## 2021-02-05 DIAGNOSIS — E1165 Type 2 diabetes mellitus with hyperglycemia: Secondary | ICD-10-CM | POA: Diagnosis not present

## 2021-02-05 DIAGNOSIS — R809 Proteinuria, unspecified: Secondary | ICD-10-CM | POA: Diagnosis not present

## 2021-02-05 DIAGNOSIS — I10 Essential (primary) hypertension: Secondary | ICD-10-CM | POA: Diagnosis not present

## 2021-02-05 DIAGNOSIS — E7800 Pure hypercholesterolemia, unspecified: Secondary | ICD-10-CM | POA: Diagnosis not present

## 2021-02-15 ENCOUNTER — Telehealth: Payer: Self-pay | Admitting: Internal Medicine

## 2021-02-15 MED ORDER — ALPRAZOLAM 0.5 MG PO TABS: 0.5000 mg | ORAL_TABLET | Freq: Four times a day (QID) | ORAL | 0 refills | Status: DC | PRN

## 2021-02-15 NOTE — Telephone Encounter (Signed)
Please advise- can you update sig for alprazolam to qid prn and resend please?

## 2021-02-15 NOTE — Addendum Note (Signed)
Addended by: Kathlene November E on: 02/15/2021 12:11 PM   Modules accepted: Orders

## 2021-02-15 NOTE — Telephone Encounter (Signed)
PDMP okay, Rx sent 

## 2021-02-15 NOTE — Telephone Encounter (Signed)
The patient states the prescription needs to be written for only four tablets a day. Insurance will not be paid for 5.  ALPRAZolam Duanne Moron) 0.5 MG tablet  [id number] [address]  [place], [place] [address]  Phone:  [phone]  Fax:  [phone]

## 2021-03-08 ENCOUNTER — Other Ambulatory Visit: Payer: Self-pay | Admitting: Internal Medicine

## 2021-03-12 ENCOUNTER — Other Ambulatory Visit: Payer: Self-pay

## 2021-03-12 MED ORDER — ONETOUCH DELICA LANCETS 33G MISC: 12 refills | Status: DC

## 2021-05-16 ENCOUNTER — Encounter: Payer: Self-pay | Admitting: Internal Medicine

## 2021-05-20 DIAGNOSIS — Z1231 Encounter for screening mammogram for malignant neoplasm of breast: Secondary | ICD-10-CM | POA: Diagnosis not present

## 2021-05-20 LAB — HM MAMMOGRAPHY

## 2021-05-30 DIAGNOSIS — E7800 Pure hypercholesterolemia, unspecified: Secondary | ICD-10-CM | POA: Diagnosis not present

## 2021-05-30 DIAGNOSIS — E1165 Type 2 diabetes mellitus with hyperglycemia: Secondary | ICD-10-CM | POA: Diagnosis not present

## 2021-05-30 LAB — BASIC METABOLIC PANEL
BUN: 14 (ref 4–21)
CO2: 25 — AB (ref 13–22)
Chloride: 106 (ref 99–108)
Creatinine: 0.7 (ref 0.5–1.1)
Glucose: 141
Potassium: 4.7 (ref 3.4–5.3)
Sodium: 141 (ref 137–147)

## 2021-05-30 LAB — HEPATIC FUNCTION PANEL
ALT: 17 (ref 7–35)
AST: 14 (ref 13–35)
Alkaline Phosphatase: 90 (ref 25–125)
Bilirubin, Total: 0.3

## 2021-05-30 LAB — COMPREHENSIVE METABOLIC PANEL
Albumin: 4.5 (ref 3.5–5.0)
Calcium: 10.1 (ref 8.7–10.7)
GFR calc Af Amer: 101
GFR calc non Af Amer: 88

## 2021-05-30 LAB — HEMOGLOBIN A1C: Hemoglobin A1C: 6.7

## 2021-06-06 ENCOUNTER — Encounter: Payer: Self-pay | Admitting: Internal Medicine

## 2021-06-06 DIAGNOSIS — E1165 Type 2 diabetes mellitus with hyperglycemia: Secondary | ICD-10-CM | POA: Diagnosis not present

## 2021-06-06 DIAGNOSIS — I10 Essential (primary) hypertension: Secondary | ICD-10-CM | POA: Diagnosis not present

## 2021-06-06 DIAGNOSIS — B379 Candidiasis, unspecified: Secondary | ICD-10-CM | POA: Diagnosis not present

## 2021-06-06 DIAGNOSIS — R809 Proteinuria, unspecified: Secondary | ICD-10-CM | POA: Diagnosis not present

## 2021-06-06 DIAGNOSIS — E7800 Pure hypercholesterolemia, unspecified: Secondary | ICD-10-CM | POA: Diagnosis not present

## 2021-06-10 ENCOUNTER — Telehealth: Payer: Self-pay | Admitting: Internal Medicine

## 2021-06-10 NOTE — Telephone Encounter (Signed)
Left message for patient to call back and schedule Medicare Annual Wellness Visit (AWV) in office.   If not able to come in office, please offer to do virtually or by telephone.  Left office number and my jabber [phone].  Last AWV:05/26/2019  Please schedule at anytime with Nurse Health Advisor.

## 2021-07-04 DIAGNOSIS — D220 Melanocytic nevi of lip: Secondary | ICD-10-CM | POA: Diagnosis not present

## 2021-07-04 DIAGNOSIS — D1801 Hemangioma of skin and subcutaneous tissue: Secondary | ICD-10-CM | POA: Diagnosis not present

## 2021-07-04 DIAGNOSIS — Z85828 Personal history of other malignant neoplasm of skin: Secondary | ICD-10-CM | POA: Diagnosis not present

## 2021-07-04 DIAGNOSIS — L820 Inflamed seborrheic keratosis: Secondary | ICD-10-CM | POA: Diagnosis not present

## 2021-07-04 DIAGNOSIS — L814 Other melanin hyperpigmentation: Secondary | ICD-10-CM | POA: Diagnosis not present

## 2021-07-04 DIAGNOSIS — L821 Other seborrheic keratosis: Secondary | ICD-10-CM | POA: Diagnosis not present

## 2021-07-19 ENCOUNTER — Ambulatory Visit (INDEPENDENT_AMBULATORY_CARE_PROVIDER_SITE_OTHER): Payer: Medicare Other | Admitting: Internal Medicine

## 2021-07-19 ENCOUNTER — Encounter: Payer: Self-pay | Admitting: Internal Medicine

## 2021-07-19 VITALS — BP 138/62 | HR 89 | Temp 97.6°F | Resp 18 | Ht 66.0 in | Wt 158.2 lb

## 2021-07-19 DIAGNOSIS — Z79899 Other long term (current) drug therapy: Secondary | ICD-10-CM | POA: Diagnosis not present

## 2021-07-19 DIAGNOSIS — F411 Generalized anxiety disorder: Secondary | ICD-10-CM | POA: Diagnosis not present

## 2021-07-19 DIAGNOSIS — E785 Hyperlipidemia, unspecified: Secondary | ICD-10-CM | POA: Diagnosis not present

## 2021-07-19 DIAGNOSIS — I10 Essential (primary) hypertension: Secondary | ICD-10-CM | POA: Diagnosis not present

## 2021-07-19 DIAGNOSIS — G4700 Insomnia, unspecified: Secondary | ICD-10-CM

## 2021-07-19 DIAGNOSIS — Z0000 Encounter for general adult medical examination without abnormal findings: Secondary | ICD-10-CM | POA: Diagnosis not present

## 2021-07-19 DIAGNOSIS — E559 Vitamin D deficiency, unspecified: Secondary | ICD-10-CM | POA: Diagnosis not present

## 2021-07-19 DIAGNOSIS — E118 Type 2 diabetes mellitus with unspecified complications: Secondary | ICD-10-CM | POA: Diagnosis not present

## 2021-07-19 LAB — CBC WITH DIFFERENTIAL/PLATELET
Basophils Absolute: 0.1 10*3/uL (ref 0.0–0.1)
Basophils Relative: 0.7 % (ref 0.0–3.0)
Eosinophils Absolute: 0.1 10*3/uL (ref 0.0–0.7)
Eosinophils Relative: 2.1 % (ref 0.0–5.0)
HCT: 46.6 % — ABNORMAL HIGH (ref 36.0–46.0)
Hemoglobin: 15.2 g/dL — ABNORMAL HIGH (ref 12.0–15.0)
Lymphocytes Relative: 24.1 % (ref 12.0–46.0)
Lymphs Abs: 1.7 10*3/uL (ref 0.7–4.0)
MCHC: 32.6 g/dL (ref 30.0–36.0)
MCV: 96.5 fl (ref 78.0–100.0)
Monocytes Absolute: 0.4 10*3/uL (ref 0.1–1.0)
Monocytes Relative: 5.9 % (ref 3.0–12.0)
Neutro Abs: 4.8 10*3/uL (ref 1.4–7.7)
Neutrophils Relative %: 67.2 % (ref 43.0–77.0)
Platelets: 251 10*3/uL (ref 150.0–400.0)
RBC: 4.83 Mil/uL (ref 3.87–5.11)
RDW: 14.3 % (ref 11.5–15.5)
WBC: 7.1 10*3/uL (ref 4.0–10.5)

## 2021-07-19 LAB — LIPID PANEL
Cholesterol: 187 mg/dL (ref 0–200)
HDL: 48.7 mg/dL (ref >39.00)
LDL Cholesterol: 105 mg/dL — ABNORMAL HIGH (ref 0–99)
NonHDL: 137.97
Total CHOL/HDL Ratio: 4
Triglycerides: 165 mg/dL — ABNORMAL HIGH (ref 0.0–149.0)
VLDL: 33 mg/dL (ref 0.0–40.0)

## 2021-07-19 LAB — TSH: TSH: 2.27 u[IU]/mL (ref 0.35–5.50)

## 2021-07-19 LAB — VITAMIN D 25 HYDROXY (VIT D DEFICIENCY, FRACTURES): VITD: 41.28 ng/mL (ref 30.00–100.00)

## 2021-07-19 NOTE — Assessment & Plan Note (Signed)
-  Td 2015 - zostavax 2013; shingrix recommended - PNM 23:  2014; prevnar 12-2014; needs PNM 20, not available today - covid vax booster recommended - had a flu shot  - CCS: no previous screenings, not interested, benefits discussed - female care PAP 2019,  neg MMG 05-2021 ([facility]) DEXA: per gyn T score -1.6 (May 2022) -Labs: FLP CBC TSH vit D  -Diet, exercise, calcium and vitamin D supplements : -POA discussed

## 2021-07-19 NOTE — Assessment & Plan Note (Addendum)
Assessment  HTN: DX 09-2019 DM  Dx 2009--per endo Dr Chalmers Cater Hyperlipidemia Anxiety, insomnia-- xanax per pcp CV: --Ao stenosis mild/mod grade 2 D disfx ECHO 10/2013. Menopausal H/o Kidney stones  PLAN:  Here for CPX HTN: BP satisfactory today, at home is 130s , sometimes in the 140s.  Discussed increase amlodipine to 10 mg, but she declined.  We agreed to continue monitoring BPs.  Consider ACE inhibitors (they were prescribed in 2016) DM: Per Endo Hyperlipidemia checking labs, continue Lipitor 20 mg. Anxiety insomnia: On Xanax, check UDS. DJD, MSK: Has back pain and knee pain on and off, Dr. Alfonso Ramus recommended tramadol which she rarely takes and ibuprofen (GI precautions discussed) and Tylenol. Foot ulcer: See last visit, symptoms completely resolved after she took antibiotics. RTC 4 months

## 2021-07-19 NOTE — Progress Notes (Signed)
Subjective:    Patient ID: Nicole Duran, female    DOB: Feb 06, 1951, 70 y.o.   MRN: [id number]  DOS:  07/19/2021 Type of visit - description: CPX  Since the last office visit is doing well. Had a couple of yeast infections that were treated by Endo. Aches and pains at baseline  Review of Systems  Other than above, a 14 point review of systems is negative      Past Medical History:  Diagnosis Date   ALLERGIC RHINITIS 04/22/2007   Anxiety    AODM 08/14/2008   HYPERLIPIDEMIA 04/22/2007   Insomnia    Kidney stone    Menopause    Metabolic syndrome     Past Surgical History:  Procedure Laterality Date   cataracts Bilateral 06/15 11/15   CHOLECYSTECTOMY     Social History   Socioeconomic History   Marital status: Widowed    Spouse name: Not on file   Number of children: 1   Years of education: Not on file   Highest education level: Not on file  Occupational History   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[place], Research scientist (physical sciences): [employer]  Tobacco Use   Smoking status: Former   Smokeless tobacco: Never   Tobacco comments:    never heavy use , quit in the 80s  Substance and Sexual Activity   Alcohol use: Yes    Alcohol/week: 0.0 standard drinks    Comment: socially   Drug use: No   Sexual activity: Not Currently  Other Topics Concern   Not on file  Social History Narrative   Lives by herself, widow ([place]), 1 daughter Education officer, 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[place], [place])  , [place] mother 2014         Social Determinants of Radio broadcast assistant Strain: Not on [employer] Insecurity: Not on file  Transportation Needs: Not on file  Physical Activity: Not on file  Stress: Not on file  Social Connections: Not on file  Intimate Partner Violence: Not on file    Allergies as of 07/19/2021       Reactions   Cefuroxime Axetil Hives, Shortness Of Breath, Itching   Ceftin   Amoxicillin-pot Clavulanate Nausea And Vomiting    Ezetimibe-simvastatin Nausea Only   Moxifloxacin Other (See Comments)   REACTION: pt states made her "deathly sick" -- nose bleeds Avelox   Nabumetone Nausea Only   Nitrofurantoin Nausea And Vomiting   Sulfonamide Derivatives Nausea Only   Prednisone Rash        Medication List        Accurate as of July 19, 2021  4:59 PM. If you have any questions, ask your nurse or doctor.          STOP taking these medications    doxycycline 100 MG tablet Commonly known as: VIBRA-TABS Stopped by: Kathlene November, MD       TAKE these medications    ALPRAZolam 0.5 MG tablet Commonly known as: XANAX Take 1 tablet (0.5 mg total) by mouth 4 (four) times daily as needed for anxiety.   amLODipine 5 MG tablet Commonly known as: NORVASC Take 1 tablet (5 mg total) by mouth daily.   atorvastatin 20 MG tablet Commonly known as: LIPITOR Take 1 tablet (20 mg total) by mouth at bedtime.   azelastine 0.1 % nasal spray Commonly known as: ASTELIN Place 2 sprays into both nostrils 2 (two) times daily.   BENADRYL PO Take by  mouth as needed. Reported on 02/14/2016   ibuprofen 200 MG tablet Commonly known as: ADVIL Take 200 mg by mouth every 6 (six) hours as needed.   Janumet XR [phone] MG Tb24 Generic drug: SitaGLIPtin-MetFORMIN HCl Take 1 tablet by mouth every evening.   Jardiance 25 MG Tabs tablet Generic drug: empagliflozin Take 25 mg by mouth in the morning.   multivitamin with minerals Tabs tablet Take 1 tablet by mouth daily.   NASACORT AQ NA Place into the nose. Daily   ONE TOUCH ULTRA 2 w/Device Kit Use once daily to check blood sugar   ONE TOUCH ULTRA TEST test strip Generic drug: glucose blood USE 1 STRIP DAILY AS DIRECTED.   OneTouch Delica Lancets 66K Misc Check blood sugars once daily   Vitamin D (Cholecalciferol) 25 MCG (1000 UT) Tabs Take 400 mg by mouth daily.   ZYRTEC PO Take 1 tablet by mouth daily as needed.           Objective:   Physical  Exam BP 138/62 (BP Location: Right Arm, Patient Position: Sitting, Cuff Size: Small)   Pulse 89   Temp 97.6 F (36.4 C) (Oral)   Resp 18   Ht '5\' 6"'  (1.676 m)   Wt 158 lb 4 oz (71.8 kg)   SpO2 96%   BMI 25.54 kg/m  General: Well developed, NAD, BMI noted Neck: No  thyromegaly  HEENT:  Normocephalic . Face symmetric, atraumatic Lungs:  CTA B Normal respiratory effort, no intercostal retractions, no accessory muscle use. Heart: RRR, soft systolic murmur.  Abdomen:  Not distended, soft, non-tender. No rebound or rigidity.   Lower extremities: no pretibial edema bilaterally  Skin: Exposed areas without rash. Not pale. Not jaundice Neurologic:  alert & oriented X3.  Speech normal, gait appropriate for age and unassisted Strength symmetric and appropriate for age.  Psych: Cognition and judgment appear intact.  Cooperative with normal attention span and concentration.  Behavior appropriate. No anxious or depressed appearing.     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 Menopausal H/o Kidney stones  PLAN:  Here for CPX HTN: BP satisfactory today, at home is 130s , sometimes in the 140s.  Discussed increase amlodipine to 10 mg, but she declined.  We agreed to continue monitoring BPs.  Consider ACE inhibitors (they were prescribed in 2016) DM: Per Endo Hyperlipidemia checking labs, continue Lipitor 20 mg. Anxiety insomnia: On Xanax, check UDS. DJD, MSK: Has back pain and knee pain on and off, Dr. Alfonso Ramus recommended tramadol which she rarely takes and ibuprofen (GI precautions discussed) and Tylenol. Foot ulcer: See last visit, symptoms completely resolved after she took antibiotics. RTC 4 months   This visit occurred during the SARS-CoV-2 public health emergency.  Safety protocols were in place, including screening questions prior to  the visit, additional usage of staff PPE, and extensive cleaning of exam room while observing appropriate contact time as indicated for disinfecting solutions.

## 2021-07-19 NOTE — Patient Instructions (Addendum)
Proceed with the following vaccines: Shingrix Pneumonia 20 (Prevnar 20) Please consider a COVID booster   IBUPROFEN  as needed for pain.  Always take it with food because may cause gastritis and ulcers.  If you notice nausea, stomach pain, change in the color of stools --->  Stop the medicine and let us know  Vitamin D: Take at least 1000 units daily.  Check the  blood pressure   BP GOAL is between 110/65 and  135/85. If it is consistently higher or lower, let me know     GO TO THE LAB : Get the blood work     [place], [place] back for a checkup in 4 months     "Living will", "[place] of attorney": Advanced care planning  (If you already have a living will or healthcare power of attorney, please bring the copy to be scanned in your chart.)  Advance care planning is a process that supports adults in  understanding and sharing their preferences regarding future medical care.   The patient's preferences are recorded in documents called Advance Directives.    Advanced directives are completed (and can be modified at any time) while the patient is in full mental capacity.   The documentation should be available at all times to the patient, the family and the healthcare providers.  Bring in a copy to be scanned in your chart is an excellent idea and is recommended   This legal documents direct treatment decision making and/or appoint a surrogate to make the decision if the patient is not capable to do so.    Advance directives can be documented in many types of formats,  documents have names such as:  Lliving will  Durable power of attorney for healthcare (healthcare proxy or healthcare power of attorney)  Combined directives  Physician orders for life-sustaining treatment    More information at:  [email or web address]

## 2021-07-20 ENCOUNTER — Encounter: Payer: Self-pay | Admitting: Internal Medicine

## 2021-07-21 LAB — DRUG MONITORING PANEL 375977 , URINE

## 2021-07-21 LAB — DM TEMPLATE

## 2021-07-28 ENCOUNTER — Other Ambulatory Visit: Payer: Self-pay | Admitting: Internal Medicine

## 2021-07-29 ENCOUNTER — Telehealth: Payer: Self-pay | Admitting: Internal Medicine

## 2021-07-30 NOTE — Telephone Encounter (Signed)
PDMP okay, Rx sent 

## 2021-07-30 NOTE — Telephone Encounter (Signed)
Requesting: alprazolam 0.5mg  Contract: 07/17/2020 UDS: 07/19/2021 Last Visit: 07/19/2021 Next Visit: 07/29/2022 Last Refill: 02/15/2021 #120 and 0RF  Please Advise

## 2021-08-19 ENCOUNTER — Ambulatory Visit (INDEPENDENT_AMBULATORY_CARE_PROVIDER_SITE_OTHER): Payer: Medicare Other

## 2021-08-19 VITALS — Ht 66.0 in | Wt 158.0 lb

## 2021-08-19 DIAGNOSIS — Z0000 Encounter for general adult medical examination without abnormal findings: Secondary | ICD-10-CM | POA: Diagnosis not present

## 2021-08-19 NOTE — Progress Notes (Signed)
Subjective:   Nicole Duran is a 69 y.o. female who presents for Medicare Annual (Subsequent) preventive examination.  I connected with Nicole Duran today by telephone and verified that I am speaking with the correct person using two identifiers. Location patient: home Location provider: work Persons participating in the virtual visit: patient, Marine scientist.    I discussed the limitations, risks, security and privacy concerns of performing an evaluation and management service by telephone and the availability of in person appointments. I also discussed with the patient that there may be a patient responsible charge related to this service. The patient expressed understanding and verbally consented to this telephonic visit.    Interactive audio and video telecommunications were attempted between this provider and patient, however failed, due to patient having technical difficulties OR patient did not have access to video capability.  We continued and completed visit with audio only.  Some vital signs may be absent or patient reported.   Time Spent with patient on telephone encounter: 20 minutes   Review of Systems     Cardiac Risk Factors include: advanced age (>68mn, >>66women);diabetes mellitus;dyslipidemia     Objective:    Today's Vitals   08/19/21 0941  Weight: 158 lb (71.7 kg)  Height: '5\' 6"'  (1.676 m)   Body mass index is 25.5 kg/m.  Advanced Directives 08/19/2021 05/26/2019 05/18/2018 04/19/2018 02/27/2017  Does Patient Have a Medical Advance Directive? Yes No No Yes No  Type of [place] will - - - -  Does patient want to make changes to medical advance directive? - - Yes (MAU/Ambulatory/Procedural Areas - Information given) No - Patient declined -  Copy of [place]? Yes - validated most recent copy scanned in chart (See row information) - - - -  Would patient like information on creating a medical advance  directive? - No - Patient declined - - Yes (MAU/Ambulatory/Procedural Areas - Information given)    Current Medications (verified) Outpatient Encounter Medications as of 08/19/2021  Medication Sig   ALPRAZolam (XANAX) 0.5 MG tablet TAKE 1 TABLET BY MOUTH 4 TIMES A DAY AS NEEDED FOR ANXIETY   amLODipine (NORVASC) 5 MG tablet Take 1 tablet (5 mg total) by mouth daily.   atorvastatin (LIPITOR) 20 MG tablet TAKE 1 TABLET BY MOUTH EVERYDAY AT BEDTIME   azelastine (ASTELIN) 0.1 % nasal spray Place 2 sprays into both nostrils 2 (two) times daily.   Blood Glucose Monitoring Suppl (ONE TOUCH ULTRA 2) W/DEVICE KIT Use once daily to check blood sugar   Cetirizine HCl (ZYRTEC PO) Take 1 tablet by mouth daily as needed.   DiphenhydrAMINE HCl (BENADRYL PO) Take by mouth as needed. Reported on 02/14/2016   ibuprofen (ADVIL,MOTRIN) 200 MG tablet Take 200 mg by mouth every 6 (six) hours as needed.   JARDIANCE 25 MG TABS tablet Take 25 mg by mouth in the morning.   Multiple Vitamin (MULTIVITAMIN WITH MINERALS) TABS tablet Take 1 tablet by mouth daily.   ONE TOUCH ULTRA TEST test strip USE 1 STRIP DAILY AS DIRECTED.   OneTouch Delica Lancets 316XMISC Check blood sugars once daily   SitaGLIPtin-MetFORMIN HCl (JANUMET XR) 832 [id number] [id number] MG TB24 Take 1 tablet by mouth every evening.   Triamcinolone Acetonide (NASACORT AQ NA) Place into the nose. Daily   Vitamin D, Cholecalciferol, 1000 units TABS Take 400 mg by mouth daily.   No facility-administered encounter medications on file as of 08/19/2021.    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   HYPERLIPIDEMIA 04/22/2007   Insomnia    Kidney stone    Menopause    Metabolic syndrome    Past Surgical History:  Procedure  Laterality Date   cataracts Bilateral 06/15 11/15   CHOLECYSTECTOMY     Family History  Problem Relation Age of Onset   Diabetes Mother    Dementia Mother        mother also had- PAD, psychosis   Hypertension Mother    Coronary artery disease Father 36       CABG   Stroke Father 73   Multiple myeloma Father 29   Colon cancer Neg Hx    Breast cancer Neg Hx    Social History   Socioeconomic History   Marital status: Widowed    Spouse name: Not on file   Number of children: 1   Years of education: Not on file   Highest education level: Not on file  Occupational History   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[place], Research scientist (physical sciences): [employer]  Tobacco Use   Smoking status: Former   Smokeless tobacco: Never   Tobacco comments:    never heavy use , quit in the 80s  Substance and Sexual Activity   Alcohol use: Yes    Alcohol/week: 0.0 standard drinks    Comment: socially   Drug use: No   Sexual activity: Not Currently  Other Topics Concern   Not on file  Social History Narrative   Lives by herself, widow ([place]), 1 daughter social worker ([place], [place])  , [place] mother 2014         Social Determinants of Radio broadcast assistant Strain: Low Risk    Difficulty of Paying Living Expenses: Not hard at all  Food Insecurity: No Food Insecurity   Worried About Charity fundraiser in the Last Year: Never true   Arboriculturist in the Last Year: Never true  Transportation Needs: No Transportation Needs   Lack of Transportation (Medical): No   Lack of Transportation (Non-Medical): No  Physical Activity: Sufficiently Active   Days of Exercise per Week: 7 days   Minutes of Exercise per Session: 30 min  Stress: No Stress Concern Present   Feeling of Stress : Not at all  Social Connections: Moderately Isolated   Frequency of Communication with Friends and Family: More than three times a week   Frequency of Social Gatherings with Friends and  Family: More than three times a week   Attends Religious Services: Never   Marine scientist or Organizations: Yes   Attends Music therapist: More than 4 times per year   Marital Status: Widowed    Tobacco Counseling Counseling given: Not Answered Tobacco comments: never heavy use , quit in the 80s   Clinical Intake:  Pre-visit preparation completed: Yes        BMI - recorded: 25.5 Nutritional Status: BMI 25 -29 Overweight Nutritional Risks: None Diabetes: Yes CBG done?: No Did pt. bring in CBG monitor from home?: No (phone visit)  How often do you need to have someone help you when you read instructions, pamphlets, or other written materials from your doctor or pharmacy?: 1 - Never  Diabetes:  Is the patient diabetic?  Yes  If diabetic, was a CBG obtained today?  No  Did the patient bring in their glucometer from home?  No phone visit How often do you monitor your CBG's? . Daily  Financial Strains and Diabetes Management:  Are you having any financial strains with the device, your supplies or your medication? No .  Does the patient want to be seen by Chronic Care Management for management of their diabetes?  No  Would the patient like to be referred to a Nutritionist or for Diabetic Management?  No   Diabetic Exams:  Diabetic Eye Exam: Completed 12/13/2020.   Diabetic Foot Exam:  Pt has been advised about the importance in completing this exam. O be completed by PCP.   Interpreter Needed?: No  Information entered by :: Caroleen Hamman LPN   Activities of Daily Living In your present state of health, do you have any difficulty performing the following activities: 08/19/2021 08/18/2021  Hearing? N N  Vision? N N  Difficulty concentrating or making decisions? N N  Walking or climbing stairs? Tempie Donning  Comment going up stairs -  Dressing or bathing? N N  Doing errands, shopping? N N  Preparing Food and eating ? N N  Using the Toilet? N N  In  the past six months, have you accidently leaked urine? N N  Do you have problems with loss of bowel control? N N  Managing your Medications? N N  Managing your Finances? N N  Housekeeping or managing your Housekeeping? N N  Some recent data might be hidden    Patient Care Team: Colon Branch, MD as PCP - General Jacelyn Pi, MD as Consulting Physician (Endocrinology) Dene Gentry, MD as Consulting Physician (Sports Medicine) Allyn Kenner, DO as Consulting Physician (Obstetrics and Gynecology) Rutherford Guys, MD as Consulting Physician (Ophthalmology)  Indicate any recent Medical Services you may have received from other than [facility] providers in the past year (date may be approximate).     Assessment:   This is a routine wellness examination for Nicole Duran.  Hearing/Vision screen Hearing Screening - Comments:: No issues Vision Screening - Comments:: Last eye exam-11/2020-Dr. Gershon Crane  Dietary issues and exercise activities discussed: Current Exercise Habits: Home exercise routine, Type of exercise: Other - see comments (tai Chi), Time (Minutes): 30, Frequency (Times/Week): 7, Weekly Exercise (Minutes/Week): 210, Intensity: Mild, Exercise limited by: None identified   Goals Addressed               This Visit's Progress     Patient Stated     Maintain healthy lifestyle (pt-stated)   On track     Other     Patient Stated        Increase exercise       Depression Screen PHQ 2/9 Scores 08/19/2021 07/19/2021 07/17/2020 05/26/2019 05/18/2018 04/19/2018 04/19/2018  PHQ - 2 Score 0 0 0 0 0 0 0    Fall Risk Fall Risk  08/19/2021 08/18/2021 07/19/2021 12/25/2020 11/17/2019  Falls in the past year? 0 0 0 0 0  Number falls in past yr: 0 - 0 0 0  Injury with Fall? 0 0 0 0 0  Follow up Falls prevention discussed - Falls evaluation completed - Falls evaluation completed    FALL RISK PREVENTION PERTAINING TO THE HOME:  Any stairs in or around the home? No  Home free of loose  throw rugs in walkways, pet beds, electrical cords, etc? Yes  Adequate lighting in your home to reduce risk  of falls? Yes   ASSISTIVE DEVICES UTILIZED TO PREVENT FALLS:  Life alert? No  Use of a cane, walker or w/c? No  Grab bars in the bathroom? Yes  Shower chair or bench in shower? Yes  Elevated toilet seat or a handicapped toilet? No   TIMED UP AND GO:  Was the test performed? No . Phone visit   Cognitive Function:Normal cognitive status assessed by this Nurse Health Advisor. No abnormalities found.          Immunizations Immunization History  Administered Date(s) Administered   Fluad Quad(high Dose 65+) 05/26/2019, 06/01/2020   Influenza Split 06/01/2021   Influenza, High Dose Seasonal PF 06/07/2018   Influenza,inj,Quad PF,6+ Mos 06/05/2017   Influenza-Unspecified 06/02/2011, 06/01/2013, 06/01/2014, 06/02/2015, 06/01/2016   PFIZER Comirnaty(Gray Top)Covid-19 Tri-Sucrose Vaccine 03/28/2021   PFIZER(Purple Top)SARS-COV-2 Vaccination 10/09/2019, 11/03/2019, 06/29/2020   Pfizer Covid-19 Vaccine Bivalent Booster 29yr & up 08/06/2021   Pneumococcal Conjugate-13 12/21/2014   Pneumococcal Polysaccharide-23 08/22/2013   Td 09/02/2003   Tetanus 01/03/2014   Zoster, Live 05/12/2012    TDAP status: Up to date  Flu Vaccine status: Up to date  Pneumococcal vaccine status: Up to date  Covid-19 vaccine status: Completed vaccines  Qualifies for Shingles Vaccine? Yes   Zostavax completed Yes   Shingrix Completed?: No.    Education has been provided regarding the importance of this vaccine. Patient has been advised to call insurance company to determine out of pocket expense if they have not yet received this vaccine. Advised may also receive vaccine at local pharmacy or Health Dept. Verbalized acceptance and understanding.  Screening Tests Health Maintenance  Topic Date Due   Zoster Vaccines- Shingrix (1 of 2) Never done   COLONOSCOPY (Pts 45-477yrInsurance coverage will  need to be confirmed)  Never done   Pneumonia Vaccine 6563Years old (3 - PPSV23 if available, else PCV20) 08/22/2018   FOOT EXAM  03/27/2021   HEMOGLOBIN A1C  11/27/2021   OPHTHALMOLOGY EXAM  12/13/2021   MAMMOGRAM  05/20/2022   TETANUS/TDAP  01/04/2024   INFLUENZA VACCINE  Completed   DEXA SCAN  Completed   COVID-19 Vaccine  Completed   Hepatitis C Screening  Completed   HPV VACCINES  Aged Out    Health Maintenance  Health Maintenance Due  Topic Date Due   Zoster Vaccines- Shingrix (1 of 2) Never done   COLONOSCOPY (Pts 45-4934yrnsurance coverage will need to be confirmed)  Never done   Pneumonia Vaccine 65+57ears old (3 - PPSV23 if available, else PCV20) 08/22/2018   FOOT EXAM  03/27/2021   Colorectal cancer screening: Due-Declined  Mammogram status: Completed bilateral 05/20/2021. Repeat every year  Bone Density status: Completed 01/04/2021. Results reflect: Bone density results: OSTEOPENIA. Repeat every 2 years.  Lung Cancer Screening: (Low Dose CT Chest recommended if Age 1-86-80ars, 30 pack-year currently smoking OR have quit w/in 15years.) does not qualify.     Additional Screening:  Hepatitis C Screening: Completed 08/14/2016  Vision Screening: Recommended annual ophthalmology exams for early detection of glaucoma and other disorders of the eye. Is the patient up to date with their annual eye exam?  Yes  Who is the provider or what is the name of the office in which the patient attends annual eye exams? Dr. ShaGershon CraneDental Screening: Recommended annual dental exams for proper oral hygiene  Community Resource Referral / Chronic Care Management: CRR required this visit?  No   CCM required this visit?  No  Plan:     I have personally reviewed and noted the following in the patients chart:   Medical and social history Use of alcohol, tobacco or illicit drugs  Current medications and supplements including opioid prescriptions.  Functional ability and  status Nutritional status Physical activity Advanced directives List of other physicians Hospitalizations, surgeries, and ER visits in previous 12 months Vitals Screenings to include cognitive, depression, and falls Referrals and appointments  In addition, I have reviewed and discussed with patient certain preventive protocols, quality metrics, and best practice recommendations. A written personalized care plan for preventive services as well as general preventive health recommendations were provided to patient.   Due to this being a telephonic visit, the after visit summary with patients personalized plan was offered to patient via mail or my-chart. Patient would like to access on my-chart.   Marta Antu, LPN   [id number]  Nurse Health Advisor  Nurse Notes: None

## 2021-08-19 NOTE — Patient Instructions (Signed)
Nicole Duran , Thank you for taking time to complete your Medicare Wellness Visit. I appreciate your ongoing commitment to your health goals. Please review the following plan we discussed and let me know if I can assist you in the future.   Screening recommendations/referrals: Colonoscopy: Due-Declined today. Please call the office for a referral if you change your mind. Mammogram: Completed 05/20/2021-Due 05/20/2022 Bone Density: Completed 01/04/2021-Due 01/05/2023 Recommended yearly ophthalmology/optometry visit for glaucoma screening and checkup Recommended yearly dental visit for hygiene and checkup  Vaccinations: Influenza vaccine: Up to date Pneumococcal vaccine: Due-May obtain vaccine at our office or your local pharmacy. Tdap vaccine: Up to date Shingles vaccine: Discuss with pharmacy   Covid-19:Up to date  Advanced directives: Please bring a copy of Living Will and/or Healthcare Power of Attorney for your chart.   Conditions/risks identified: See problem list  Next appointment: Follow up in one year for your annual wellness visit 08/21/2022 @ 9:40 ( phone visit)   Preventive Care 65 Years and Older, Female Preventive care refers to lifestyle choices and visits with your health care provider that can promote health and wellness. What does preventive care include? A yearly physical exam. This is also called an annual well check. Dental exams once or twice a year. Routine eye exams. Ask your health care provider how often you should have your eyes checked. Personal lifestyle choices, including: Daily care of your teeth and gums. Regular physical activity. Eating a healthy diet. Avoiding tobacco and drug use. Limiting alcohol use. Practicing safe sex. Taking low-dose aspirin every day. Taking vitamin and mineral supplements as recommended by your health care provider. What happens during an annual well check? The services and screenings done by your health care provider during  your annual well check will depend on your age, overall health, lifestyle risk factors, and family history of disease. Counseling  Your health care provider may ask you questions about your: Alcohol use. Tobacco use. Drug use. Emotional well-being. Home and relationship well-being. Sexual activity. Eating habits. History of falls. Memory and ability to understand (cognition). Work and work Statistician. Reproductive health. Screening  You may have the following tests or measurements: Height, weight, and BMI. Blood pressure. Lipid and cholesterol levels. These may be checked every 5 years, or more frequently if you are over 37 years old. Skin check. Lung cancer screening. You may have this screening every year starting at age 6 if you have a 30-pack-year history of smoking and currently smoke or have quit within the past 15 years. Fecal occult blood test (FOBT) of the stool. You may have this test every year starting at age 77. Flexible sigmoidoscopy or colonoscopy. You may have a sigmoidoscopy every 5 years or a colonoscopy every 10 years starting at age 76. Hepatitis C blood test. Hepatitis B blood test. Sexually transmitted disease (STD) testing. Diabetes screening. This is done by checking your blood sugar (glucose) after you have not eaten for a while (fasting). You may have this done every 1-3 years. Bone density scan. This is done to screen for osteoporosis. You may have this done starting at age 33. Mammogram. This may be done every 1-2 years. Talk to your health care provider about how often you should have regular mammograms. Talk with your health care provider about your test results, treatment options, and if necessary, the need for more tests. Vaccines  Your health care provider may recommend certain vaccines, such as: Influenza vaccine. This is recommended every year. Tetanus, diphtheria, and acellular pertussis (Tdap,  Td) vaccine. You may need a Td booster every 10  years. Zoster vaccine. You may need this after age 70. Pneumococcal 13-valent conjugate (PCV13) vaccine. One dose is recommended after age 54. Pneumococcal polysaccharide (PPSV23) vaccine. One dose is recommended after age 17. Talk to your health care provider about which screenings and vaccines you need and how often you need them. This information is not intended to replace advice given to you by your health care provider. Make sure you discuss any questions you have with your health care provider. Document Released: 09/14/2015 Document Revised: 05/07/2016 Document Reviewed: 06/19/2015 Elsevier Interactive Patient Education  2017 [place] Prevention in the Home Falls can cause injuries. They can happen to people of all ages. There are many things you can do to make your home safe and to help prevent falls. What can I do on the outside of my home? Regularly fix the edges of walkways and driveways and fix any cracks. Remove anything that might make you trip as you walk through a door, such as a raised step or threshold. Trim any bushes or trees on the path to your home. Use bright outdoor lighting. Clear any walking paths of anything that might make someone trip, such as rocks or tools. Regularly check to see if handrails are loose or broken. Make sure that both sides of any steps have handrails. Any raised decks and porches should have guardrails on the edges. Have any leaves, snow, or ice cleared regularly. Use sand or salt on walking paths during winter. Clean up any spills in your garage right away. This includes oil or grease spills. What can I do in the bathroom? Use night lights. Install grab bars by the toilet and in the tub and shower. Do not use towel bars as grab bars. Use non-skid mats or decals in the tub or shower. If you need to sit down in the shower, use a plastic, non-slip stool. Keep the floor dry. Clean up any water that spills on the floor as soon as it  happens. Remove soap buildup in the tub or shower regularly. Attach bath mats securely with double-sided non-slip rug tape. Do not have throw rugs and other things on the floor that can make you trip. What can I do in the bedroom? Use night lights. Make sure that you have a light by your bed that is easy to reach. Do not use any sheets or blankets that are too big for your bed. They should not hang down onto the floor. Have a firm chair that has side arms. You can use this for support while you get dressed. Do not have throw rugs and other things on the floor that can make you trip. What can I do in the kitchen? Clean up any spills right away. Avoid walking on wet floors. Keep items that you use a lot in easy-to-reach places. If you need to reach something above you, use a strong step stool that has a grab bar. Keep electrical cords out of the way. Do not use floor polish or wax that makes floors slippery. If you must use wax, use non-skid floor wax. Do not have throw rugs and other things on the floor that can make you trip. What can I do with my stairs? Do not leave any items on the stairs. Make sure that there are handrails on both sides of the stairs and use them. Fix handrails that are broken or loose. Make sure that handrails are as  long as the stairways. Check any carpeting to make sure that it is firmly attached to the stairs. Fix any carpet that is loose or worn. Avoid having throw rugs at the top or bottom of the stairs. If you do have throw rugs, attach them to the floor with carpet tape. Make sure that you have a light switch at the top of the stairs and the bottom of the stairs. If you do not have them, ask someone to add them for you. What else can I do to help prevent falls? Wear shoes that: Do not have high heels. Have rubber bottoms. Are comfortable and fit you well. Are closed at the toe. Do not wear sandals. If you use a stepladder: Make sure that it is fully opened.  Do not climb a closed stepladder. Make sure that both sides of the stepladder are locked into place. Ask someone to hold it for you, if possible. Clearly mark and make sure that you can see: Any grab bars or handrails. First and last steps. Where the edge of each step is. Use tools that help you move around (mobility aids) if they are needed. These include: Canes. Walkers. Scooters. Crutches. Turn on the lights when you go into a dark area. Replace any light bulbs as soon as they burn out. Set up your furniture so you have a clear path. Avoid moving your furniture around. If any of your floors are uneven, fix them. If there are any pets around you, be aware of where they are. Review your medicines with your doctor. Some medicines can make you feel dizzy. This can increase your chance of falling. Ask your doctor what other things that you can do to help prevent falls. This information is not intended to replace advice given to you by your health care provider. Make sure you discuss any questions you have with your health care provider. Document Released: 06/14/2009 Document Revised: 01/24/2016 Document Reviewed: 09/22/2014 Elsevier Interactive Patient Education  2017 [employer].

## 2021-09-20 ENCOUNTER — Other Ambulatory Visit: Payer: Self-pay | Admitting: Internal Medicine

## 2021-10-02 ENCOUNTER — Other Ambulatory Visit: Payer: Self-pay | Admitting: Internal Medicine

## 2021-10-02 DIAGNOSIS — I10 Essential (primary) hypertension: Secondary | ICD-10-CM

## 2021-12-16 DIAGNOSIS — E7800 Pure hypercholesterolemia, unspecified: Secondary | ICD-10-CM | POA: Diagnosis not present

## 2021-12-16 DIAGNOSIS — E1165 Type 2 diabetes mellitus with hyperglycemia: Secondary | ICD-10-CM | POA: Diagnosis not present

## 2021-12-16 LAB — LIPID PANEL
Cholesterol: 174 (ref 0–200)
HDL: 50 (ref 35–70)
LDL Cholesterol: 88
Triglycerides: 179 — AB (ref 40–160)

## 2021-12-16 LAB — BASIC METABOLIC PANEL
BUN: 13 (ref 4–21)
CO2: 28 — AB (ref 13–22)
Chloride: 108 (ref 99–108)
Creatinine: 0.8 (ref 0.5–1.1)
Glucose: 160
Potassium: 5.1 mEq/L (ref 3.5–5.1)
Sodium: 144 (ref 137–147)

## 2021-12-16 LAB — COMPREHENSIVE METABOLIC PANEL
Albumin: 4.8 (ref 3.5–5.0)
Calcium: 10.6 (ref 8.7–10.7)
eGFR: 71

## 2021-12-16 LAB — MICROALBUMIN, URINE: Microalb, Ur: 5.9

## 2021-12-16 LAB — HEPATIC FUNCTION PANEL
ALT: 21 U/L (ref 7–35)
AST: 17 (ref 13–35)
Alkaline Phosphatase: 101 (ref 25–125)
Bilirubin, Total: 0.3

## 2021-12-16 LAB — HEMOGLOBIN A1C: Hemoglobin A1C: 7

## 2021-12-23 ENCOUNTER — Encounter: Payer: Self-pay | Admitting: Internal Medicine

## 2021-12-23 DIAGNOSIS — R809 Proteinuria, unspecified: Secondary | ICD-10-CM | POA: Diagnosis not present

## 2021-12-23 DIAGNOSIS — I10 Essential (primary) hypertension: Secondary | ICD-10-CM | POA: Diagnosis not present

## 2021-12-23 DIAGNOSIS — E7800 Pure hypercholesterolemia, unspecified: Secondary | ICD-10-CM | POA: Diagnosis not present

## 2021-12-23 DIAGNOSIS — E1165 Type 2 diabetes mellitus with hyperglycemia: Secondary | ICD-10-CM | POA: Diagnosis not present

## 2022-01-20 DIAGNOSIS — E119 Type 2 diabetes mellitus without complications: Secondary | ICD-10-CM | POA: Diagnosis not present

## 2022-01-20 DIAGNOSIS — Z7984 Long term (current) use of oral hypoglycemic drugs: Secondary | ICD-10-CM | POA: Diagnosis not present

## 2022-01-20 DIAGNOSIS — Z961 Presence of intraocular lens: Secondary | ICD-10-CM | POA: Diagnosis not present

## 2022-01-20 LAB — HM DIABETES EYE EXAM

## 2022-01-21 ENCOUNTER — Encounter: Payer: Self-pay | Admitting: Internal Medicine

## 2022-02-02 ENCOUNTER — Other Ambulatory Visit: Payer: Self-pay | Admitting: Internal Medicine

## 2022-05-01 ENCOUNTER — Telehealth: Payer: Self-pay | Admitting: Internal Medicine

## 2022-05-01 NOTE — Telephone Encounter (Signed)
Patient called to see if Dr. Larose Kells recommends are getting the RSV immunization. Please call her to advise if this is okay to get with her health history.

## 2022-05-01 NOTE — Telephone Encounter (Signed)
LMOM informing Pt that PCP is recommending RSV vaccines to his Pt- we currently do not have them yet, unsure exactly when that is supposed to start, informed she could get RSV vaccine at her pharmacy now if she prefers.

## 2022-05-26 ENCOUNTER — Telehealth: Payer: Self-pay | Admitting: Internal Medicine

## 2022-05-26 NOTE — Telephone Encounter (Signed)
LMOM on 05/01/22 regarding same issue.

## 2022-05-26 NOTE — Telephone Encounter (Signed)
Pt called asking if I was recommended/ safe for her to get the RSV vaccine given her medical history. Please Advise.

## 2022-05-27 DIAGNOSIS — Z1231 Encounter for screening mammogram for malignant neoplasm of breast: Secondary | ICD-10-CM | POA: Diagnosis not present

## 2022-05-27 LAB — HM MAMMOGRAPHY

## 2022-05-30 ENCOUNTER — Encounter: Payer: Self-pay | Admitting: Internal Medicine

## 2022-06-24 DIAGNOSIS — E7800 Pure hypercholesterolemia, unspecified: Secondary | ICD-10-CM | POA: Diagnosis not present

## 2022-06-24 DIAGNOSIS — R809 Proteinuria, unspecified: Secondary | ICD-10-CM | POA: Diagnosis not present

## 2022-06-24 DIAGNOSIS — B379 Candidiasis, unspecified: Secondary | ICD-10-CM | POA: Diagnosis not present

## 2022-06-24 DIAGNOSIS — Z23 Encounter for immunization: Secondary | ICD-10-CM | POA: Diagnosis not present

## 2022-06-24 DIAGNOSIS — E1165 Type 2 diabetes mellitus with hyperglycemia: Secondary | ICD-10-CM | POA: Diagnosis not present

## 2022-06-24 DIAGNOSIS — I10 Essential (primary) hypertension: Secondary | ICD-10-CM | POA: Diagnosis not present

## 2022-06-24 LAB — HEMOGLOBIN A1C: Hemoglobin A1C: 7

## 2022-07-02 IMAGING — MR MR LUMBAR SPINE W/O CM
4 of 5 series · 19 of 48 positions shown · non-contrast
Comparison: None.

CLINICAL DATA: Low back pain radiating into the left leg for 2
months.

EXAM:
MRI LUMBAR SPINE WITHOUT CONTRAST
TECHNIQUE: Multiplanar, multisequence MR imaging of the lumbar spine was
performed. No intravenous contrast was administered.

[Series 5: T2 · sagittal · 4.0mm · 0.73mm/px · 6 of 15 slices shown (1 of 2)]
[im 1/15]
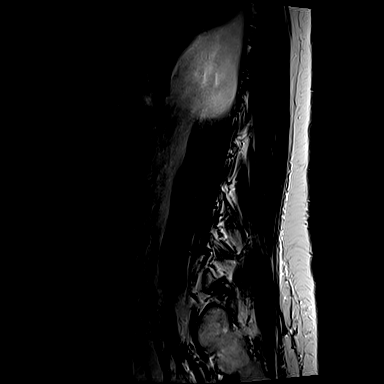
[im 3/15]
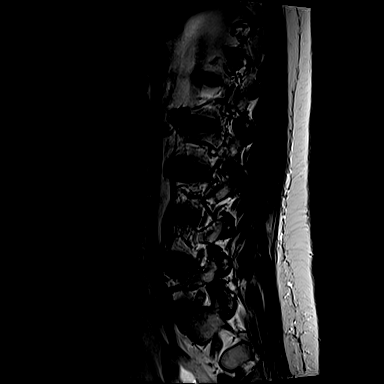
[im 6/15]
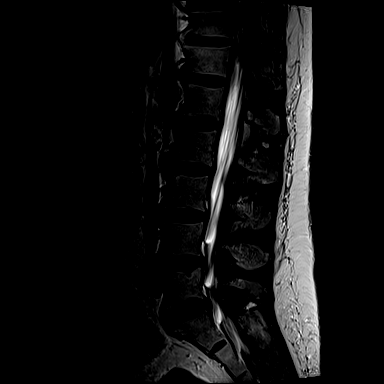
[im 9/15]
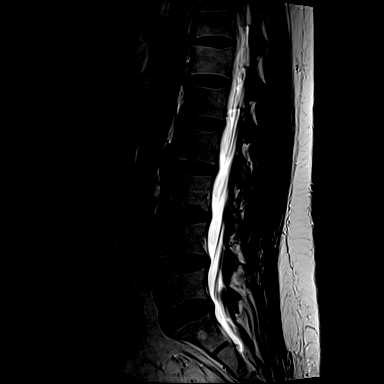
[im 12/15]
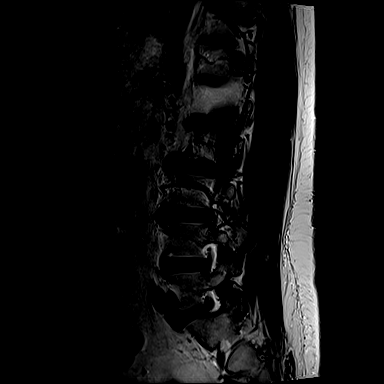
[im 15/15]
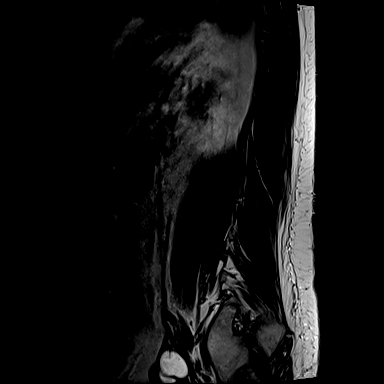

[Series 6: T1 · sagittal · 4.0mm · 0.73mm/px · 3 of 15 slices shown (1 of 2)]
[im 3/15]
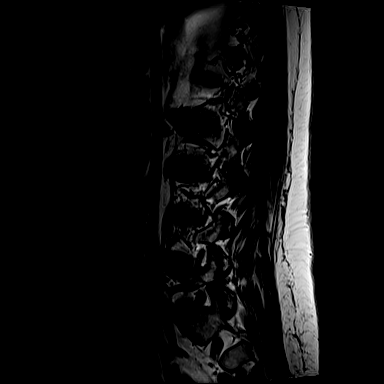
[im 9/15]
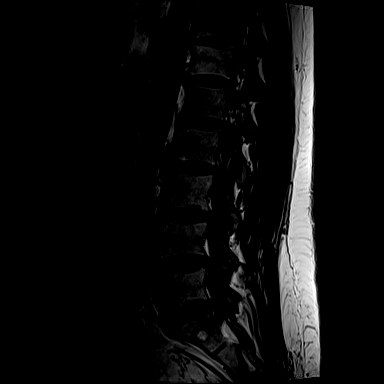
[im 15/15]
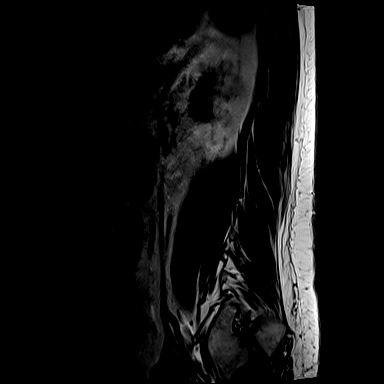

[Series 10: T1 · axial · 4.0mm · 0.28mm/px · z∈[-57,+102]mm · 3 of 39 slices shown (2 of 2)]
[im 6/39]
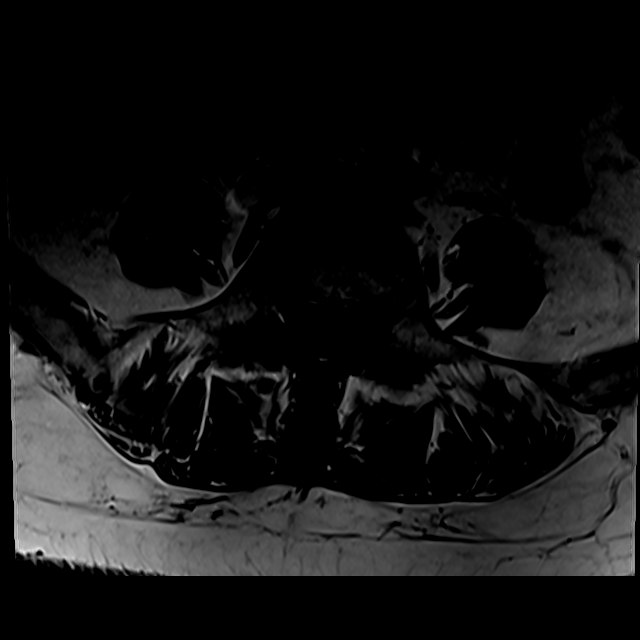
[im 20/39]
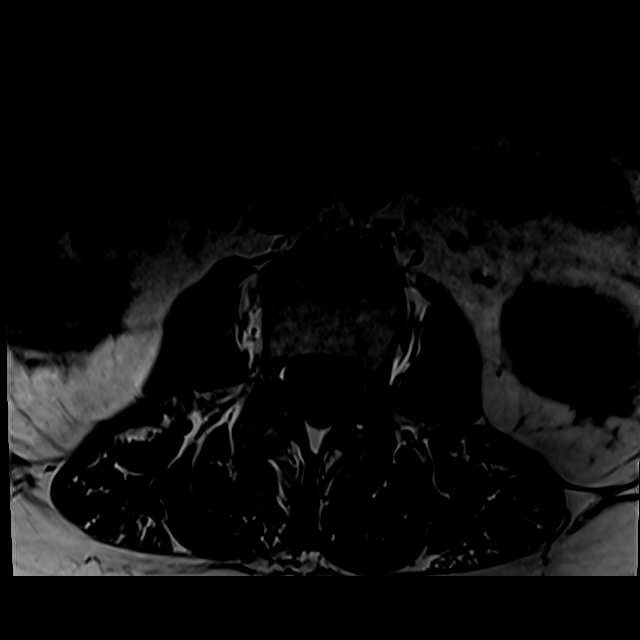
[im 33/39]
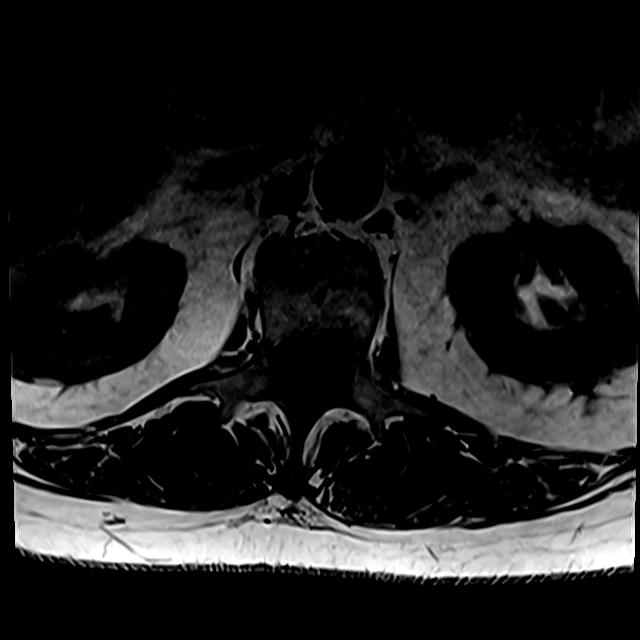

[Series 13: T2 · axial · 4.0mm · 0.28mm/px · z∈[-82,+102]mm · 7 of 39 slices shown (2 of 2)]
[im 1/39]
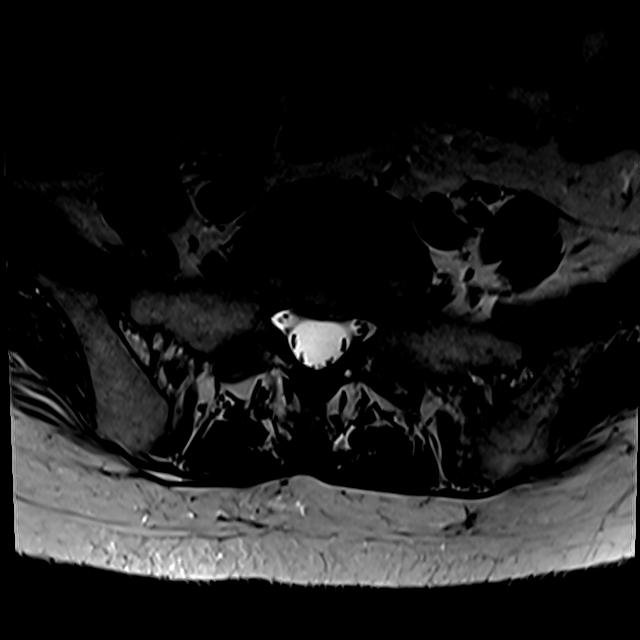
[im 6/39]
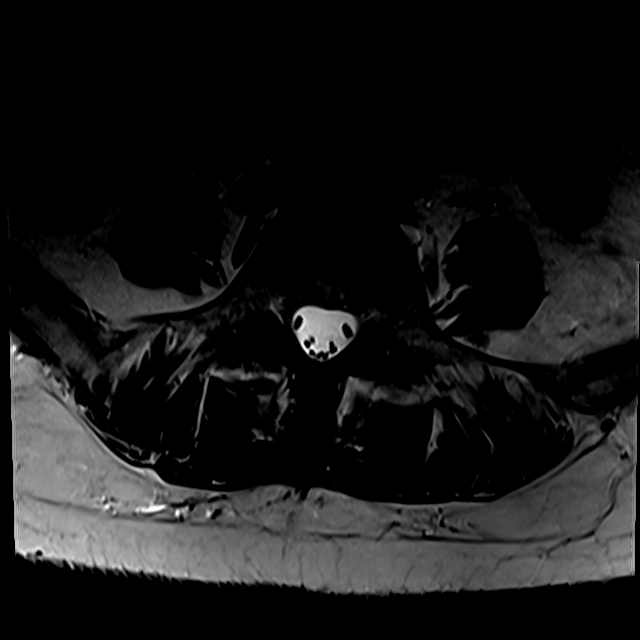
[im 11/39]
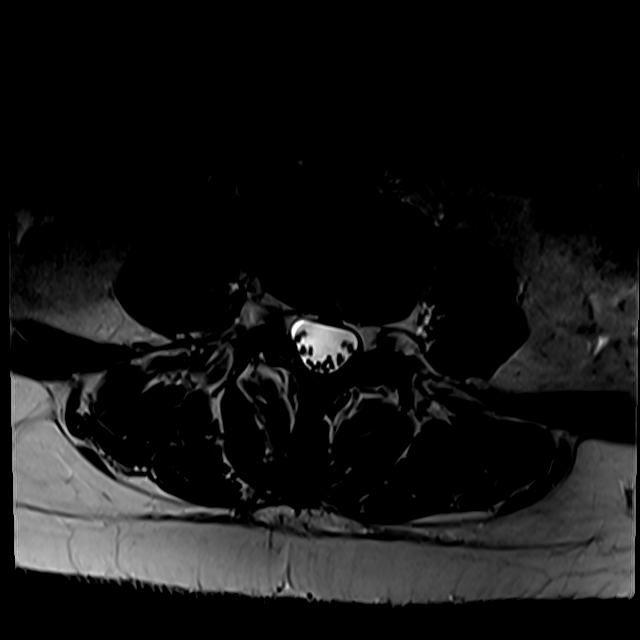
[im 17/39]
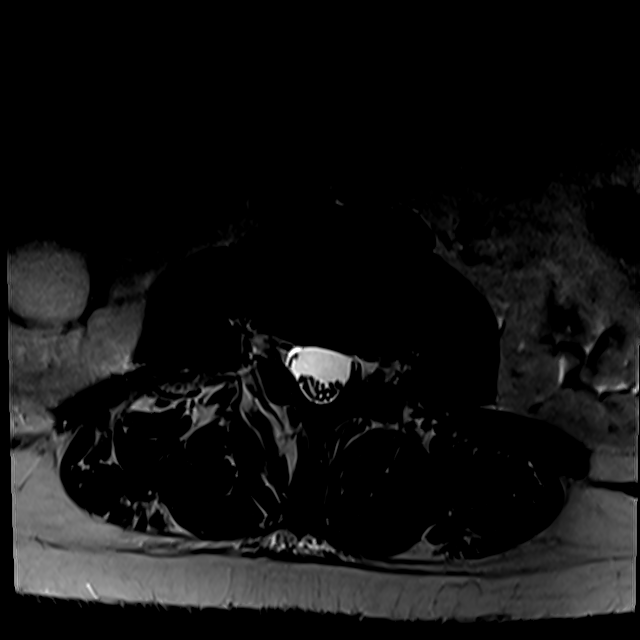
[im 20/39]
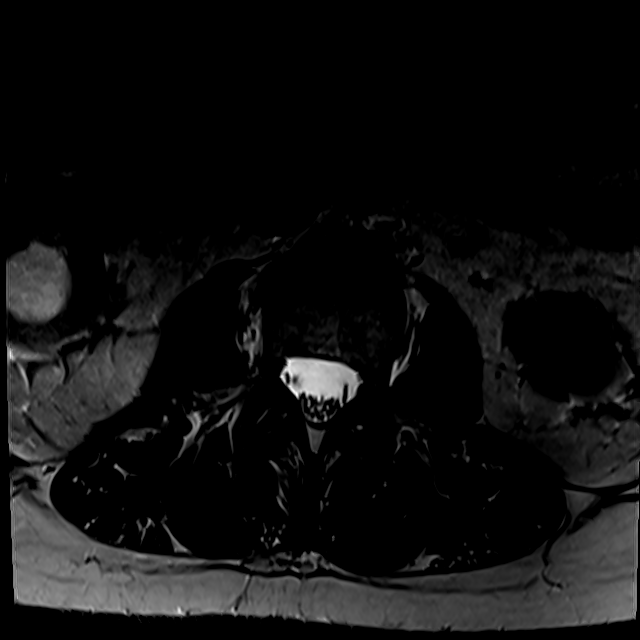
[im 22/39]
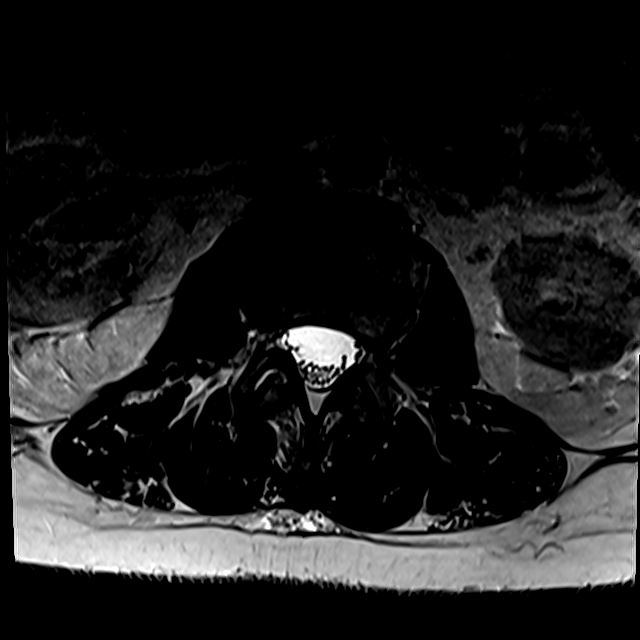
[im 33/39]
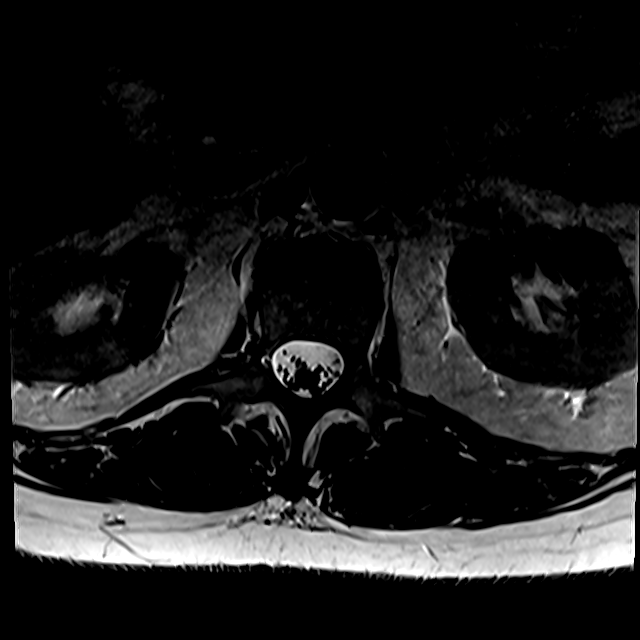

[19 of 48 positions shown; findings below may reference images not displayed]

FINDINGS: Segmentation:  Standard.

Alignment:  Normal.

Vertebrae:  No fracture, evidence of discitis, or bone lesion.

Conus medullaris and cauda equina: Conus extends to the L1 level.
Conus and cauda equina appear normal.

Paraspinal and other soft tissues: T2 hyperintense lesions in the
right kidney are most consistent with cysts.

Disc levels:

T11-12 is imaged in the sagittal plane only and negative.

T12-L1: Negative.

L1-2: Negative.

L2-3: Mild facet degenerative change.  Otherwise negative.

L3-4: Negative.

L4-5: Shallow disc bulge and mild facet degenerative change without
stenosis.

L5-S1: Mild facet degenerative change and a shallow disc bulge
without stenosis.
IMPRESSION: The central canal and foramina are open at all levels with mild disc
bulging seen in the lower lumbar segments. Scattered
mild-to-moderate facet arthropathy is also seen.

## 2022-07-04 ENCOUNTER — Other Ambulatory Visit: Payer: Self-pay | Admitting: Internal Medicine

## 2022-07-04 DIAGNOSIS — I10 Essential (primary) hypertension: Secondary | ICD-10-CM

## 2022-07-13 ENCOUNTER — Telehealth: Payer: Self-pay | Admitting: Internal Medicine

## 2022-07-14 ENCOUNTER — Encounter: Payer: Self-pay | Admitting: Internal Medicine

## 2022-07-14 NOTE — Telephone Encounter (Signed)
Requesting: alprazolam 0.'5mg'$   Contract:07/17/20 UDS:07/19/21 Last Visit: 07/19/21 Next Visit: 07/29/22 Last Refill: 07/30/21 #120 and 1RF   Please Advise

## 2022-07-14 NOTE — Telephone Encounter (Signed)
PDMP reviewed, prescription sent 

## 2022-07-29 ENCOUNTER — Encounter: Payer: Self-pay | Admitting: Internal Medicine

## 2022-07-29 ENCOUNTER — Ambulatory Visit (INDEPENDENT_AMBULATORY_CARE_PROVIDER_SITE_OTHER): Payer: Medicare Other | Admitting: Internal Medicine

## 2022-07-29 VITALS — BP 126/68 | HR 70 | Temp 97.5°F | Resp 16 | Ht 66.0 in | Wt 158.5 lb

## 2022-07-29 DIAGNOSIS — E785 Hyperlipidemia, unspecified: Secondary | ICD-10-CM | POA: Diagnosis not present

## 2022-07-29 DIAGNOSIS — Z0000 Encounter for general adult medical examination without abnormal findings: Secondary | ICD-10-CM | POA: Diagnosis not present

## 2022-07-29 DIAGNOSIS — G4700 Insomnia, unspecified: Secondary | ICD-10-CM | POA: Diagnosis not present

## 2022-07-29 DIAGNOSIS — I10 Essential (primary) hypertension: Secondary | ICD-10-CM | POA: Diagnosis not present

## 2022-07-29 DIAGNOSIS — E118 Type 2 diabetes mellitus with unspecified complications: Secondary | ICD-10-CM | POA: Diagnosis not present

## 2022-07-29 DIAGNOSIS — Z79899 Other long term (current) drug therapy: Secondary | ICD-10-CM | POA: Diagnosis not present

## 2022-07-29 DIAGNOSIS — E559 Vitamin D deficiency, unspecified: Secondary | ICD-10-CM | POA: Diagnosis not present

## 2022-07-29 LAB — CBC WITH DIFFERENTIAL/PLATELET
Basophils Absolute: 0.1 10*3/uL (ref 0.0–0.1)
Basophils Relative: 0.6 % (ref 0.0–3.0)
Eosinophils Absolute: 0.1 10*3/uL (ref 0.0–0.7)
Eosinophils Relative: 1.6 % (ref 0.0–5.0)
HCT: 48.6 % — ABNORMAL HIGH (ref 36.0–46.0)
Hemoglobin: 16.2 g/dL — ABNORMAL HIGH (ref 12.0–15.0)
Lymphocytes Relative: 24.6 % (ref 12.0–46.0)
Lymphs Abs: 2.2 10*3/uL (ref 0.7–4.0)
MCHC: 33.4 g/dL (ref 30.0–36.0)
MCV: 96.4 fl (ref 78.0–100.0)
Monocytes Absolute: 0.5 10*3/uL (ref 0.1–1.0)
Monocytes Relative: 5.4 % (ref 3.0–12.0)
Neutro Abs: 6.1 10*3/uL (ref 1.4–7.7)
Neutrophils Relative %: 67.8 % (ref 43.0–77.0)
Platelets: 297 10*3/uL (ref 150.0–400.0)
RBC: 5.04 Mil/uL (ref 3.87–5.11)
RDW: 14.4 % (ref 11.5–15.5)
WBC: 9 10*3/uL (ref 4.0–10.5)

## 2022-07-29 LAB — LIPID PANEL
Cholesterol: 187 mg/dL (ref 0–200)
HDL: 48.6 mg/dL (ref >39.00)
NonHDL: 138.53
Total CHOL/HDL Ratio: 4
Triglycerides: 203 mg/dL — ABNORMAL HIGH (ref 0.0–149.0)
VLDL: 40.6 mg/dL — ABNORMAL HIGH (ref 0.0–40.0)

## 2022-07-29 LAB — LDL CHOLESTEROL, DIRECT: Direct LDL: 111 mg/dL

## 2022-07-29 LAB — BASIC METABOLIC PANEL
BUN: 16 mg/dL (ref 6–23)
CO2: 28 mEq/L (ref 19–32)
Calcium: 9.9 mg/dL (ref 8.4–10.5)
Chloride: 104 mEq/L (ref 96–112)
Creatinine, Ser: 0.72 mg/dL (ref 0.40–1.20)
GFR: 82.82 mL/min (ref >60.00)
Glucose, Bld: 137 mg/dL — ABNORMAL HIGH (ref 70–99)
Potassium: 4.4 mEq/L (ref 3.5–5.1)
Sodium: 140 mEq/L (ref 135–145)

## 2022-07-29 LAB — VITAMIN D 25 HYDROXY (VIT D DEFICIENCY, FRACTURES): VITD: 43.86 ng/mL (ref 30.00–100.00)

## 2022-07-29 NOTE — Patient Instructions (Addendum)
Vaccines I recommend:  Shingrix (shingles)   Check the  blood pressure regularly BP GOAL is between 110/65 and  135/85. If it is consistently higher or lower, let me know  For pain, try to minimize the use of ibuprofen. -Try to use primarily Tylenol  500 mg OTC 2 tabs 3 times a day   as needed for pain -IBUPROFEN (Advil or Motrin) 200 mg 2 tablets once a day as needed for pain.  Always take it with food because may cause gastritis and ulcers.  If you notice nausea, stomach pain, change in the color of stools --->  Stop the medicine and let us know    GO TO THE LAB : Get the blood work     [place], [place] back for   a physical exam in 1 year    Do you have a "Living will" or "Shafer of attorney"? (Advance care planning documents)  If you already have a living will or healthcare power of attorney, is recommended you bring the copy to be scanned in your chart. The document will be available to all the doctors you see in the system.  If you don't have one, please consider create one.    More information at:  [email or web address]

## 2022-07-29 NOTE — Assessment & Plan Note (Signed)
-  Td 2015 - RSV done per pt - zostavax 2013; shingrix recommended - PNM 23:  2014; prevnar 12-2014; PNM 20: 2023 - covid vax utd - had a flu shot  - CCS: See previous entries, patient not interested. - female care PAP 2019,  neg MMG 05-2022 ([facility]) DEXA: per gyn T score -1.6 (May 2022) -Labs: BMP FLP CBC vitamin D -Diet, exercise, calcium and vitamin D supplements : -POA discussed

## 2022-07-29 NOTE — Assessment & Plan Note (Signed)
Here for CPX HTN: BP looks very good, recommend to check regularly, continue amlodipine, checking labs. DM: Saw Dr. Chalmers Cater 06/24/2022 DJD: Has back and knee pain, recommend to minimize use of Motrin in favor of Tylenol.  GI precautions discussed. High cholesterol: On atorvastatin.  Checking labs. Aortic stenosis: Soft systolic murmur, no symptoms. RTC 1 year

## 2022-07-29 NOTE — Progress Notes (Signed)
Subjective:    Patient ID: Nicole Duran, female    DOB: Sep 30, 1950, 71 y.o.   MRN: [id number]  DOS:  07/29/2022 Type of visit - description: cpx Here for CPX. No major concerns. Does have low back pain and knee pain, plans to see Ortho   Review of Systems  Other than above, a 14 point review of systems is negative     Past Medical History:  Diagnosis Date   ALLERGIC RHINITIS 04/22/2007   Anxiety    AODM 08/14/2008   HYPERLIPIDEMIA 04/22/2007   Insomnia    Kidney stone    Menopause    Metabolic syndrome     Past Surgical History:  Procedure Laterality Date   cataracts Bilateral 06/15 11/15   CHOLECYSTECTOMY     Social History   Socioeconomic History   Marital status: Widowed    Spouse name: Not on file   Number of children: 1   Years of education: Not on file   Highest education level: Not on file  Occupational History   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[place], Research scientist (physical sciences): [employer]  Tobacco Use   Smoking status: Former   Smokeless tobacco: Never   Tobacco comments:    never heavy use , quit in the 80s  Substance and Sexual Activity   Alcohol use: Yes    Alcohol/week: 0.0 standard drinks of alcohol    Comment: socially   Drug use: No   Sexual activity: Not Currently  Other Topics Concern   Not on file  Social History Narrative   Lives by herself, widow ([place]), 1 daughter social worker ([place], [place])  , [place] mother 2014         Social Determinants of Health   Financial Resource Strain: Low Risk  (08/19/2021)   Overall Financial Resource Strain (CARDIA)    Difficulty of Paying Living Expenses: Not hard at all  Food Insecurity: No Food Insecurity (08/19/2021)   Hunger Vital Sign    Worried About Running Out of Food in the Last Year: Never true    [place] in the Last Year: Never true  Transportation Needs: No Transportation Needs (08/19/2021)   PRAPARE - Hydrologist  (Medical): No    Lack of Transportation (Non-Medical): No  Physical Activity: Sufficiently Active (08/19/2021)   Exercise Vital Sign    Days of Exercise per Week: 7 days    Minutes of Exercise per Session: 30 min  Stress: No Stress Concern Present (08/19/2021)   [place]    Feeling of Stress : Not at all  Social Connections: Moderately Isolated (08/19/2021)   Social Connection and Isolation Panel [NHANES]    Frequency of Communication with Friends and Family: More than three times a week    Frequency of Social Gatherings with Friends and Family: More than three times a week    Attends Religious Services: Never    Marine scientist or Organizations: Yes    Attends Music therapist: More than 4 times per year    Marital Status: Widowed  Intimate Partner Violence: Not At Risk (08/19/2021)   Humiliation, Afraid, Rape, and Kick questionnaire    Fear of Current or Ex-Partner: No    Emotionally Abused: No    Physically Abused: No    Sexually Abused: No    Current Outpatient Medications  Medication Instructions  ALPRAZolam (XANAX) 0.5 MG tablet TAKE 1 TABLET BY MOUTH 4 TIMES A DAY AS NEEDED FOR ANXIETY   amLODipine (NORVASC) 5 mg, Oral, Daily   atorvastatin (LIPITOR) 20 MG tablet TAKE 1 TABLET BY MOUTH EVERYDAY AT BEDTIME   Azelastine HCl 137 MCG/SPRAY SOLN PLACE 2 SPRAYS INTO BOTH NOSTRILS 2 (TWO) TIMES DAILY   Blood Glucose Monitoring Suppl (ONE TOUCH ULTRA 2) W/DEVICE KIT Use once daily to check blood sugar   Cetirizine HCl (ZYRTEC PO) 1 tablet, Oral, Daily PRN   DiphenhydrAMINE HCl (BENADRYL PO) Oral, As needed, Reported on 02/14/2016   ibuprofen (ADVIL) 200 mg, Oral, Every 6 hours PRN,     Jardiance 25 mg, Oral, Every morning   Multiple Vitamin (MULTIVITAMIN WITH MINERALS) TABS tablet 1 tablet, Oral, Daily   ONE TOUCH ULTRA TEST test strip USE 1 STRIP DAILY AS DIRECTED.   OneTouch Delica Lancets  44W MISC Check blood sugars once daily   SitaGLIPtin-MetFORMIN HCl (JANUMET XR) [phone] MG TB24 1 tablet, Oral, Every evening   Triamcinolone Acetonide (NASACORT AQ NA) Nasal, Daily    Vitamin D (Cholecalciferol) 400 mg, Oral, Daily       Objective:   Physical Exam BP 126/68   Pulse 70   Temp (!) 97.5 F (36.4 C) (Oral)   Resp 16   Ht _0  (1.676 m)   Wt 158 lb 8 oz (71.9 kg)   SpO2 97%   BMI 25.58 kg/m  General: Well developed, NAD, BMI noted Neck: No  thyromegaly  HEENT:  Normocephalic . Face symmetric, atraumatic Lungs:  CTA B Normal respiratory effort, no intercostal retractions, no accessory muscle use. Heart: RRR, very soft systolic murmur more noticeable right from the sternum.  Abdomen:  Not distended, soft, non-tender. No rebound or rigidity.   Lower extremities: no pretibial edema bilaterally  Skin: Exposed areas without rash. Not pale. Not jaundice Neurologic:  alert & oriented X3.  Speech normal, gait appropriate for age and unassisted Strength symmetric and appropriate for age.  Psych: Cognition and judgment appear intact.  Cooperative with normal attention span and concentration.  Behavior appropriate. No anxious or depressed appearing.     Assessment   Assessment  HTN: DX 09-2019 DM  Dx 2009--per endo Dr Chalmers Cater Hyperlipidemia Anxiety, insomnia-- xanax per pcp CV: --Ao stenosis mild/mod grade 2 D disfx ECHO 10/2013. Menopausal H/o Kidney stones  PLAN:  Here for CPX HTN: BP looks very good, recommend to check regularly, continue amlodipine, checking labs. DM: Saw Dr. Chalmers Cater 06/24/2022 DJD: Has back and knee pain, recommend to minimize use of Motrin in favor of Tylenol.  GI precautions discussed. High cholesterol: On atorvastatin.  Checking labs. Aortic stenosis: Soft systolic murmur, no symptoms. RTC 1 year

## 2022-07-30 MED ORDER — ATORVASTATIN CALCIUM 40 MG PO TABS: 40.0000 mg | ORAL_TABLET | Freq: Every day | ORAL | 0 refills | Status: DC

## 2022-07-30 NOTE — Addendum Note (Signed)
Addended byDamita Dunnings D on: 07/30/2022 08:58 AM   Modules accepted: Orders

## 2022-07-31 LAB — DRUG MONITORING PANEL 375977 , URINE
Alcohol Metabolites: NEGATIVE ng/mL (ref <500)
Alphahydroxyalprazolam: 77 ng/mL — ABNORMAL HIGH (ref <25)
Alphahydroxymidazolam: NEGATIVE ng/mL (ref <50)
Alphahydroxytriazolam: NEGATIVE ng/mL (ref <50)
Aminoclonazepam: NEGATIVE ng/mL (ref <25)
Amphetamines: NEGATIVE ng/mL (ref <500)
Barbiturates: NEGATIVE ng/mL (ref <300)
Benzodiazepines: POSITIVE ng/mL — AB (ref <100)
Cocaine Metabolite: NEGATIVE ng/mL (ref <150)
Desmethyltramadol: NEGATIVE ng/mL (ref <100)
Hydroxyethylflurazepam: NEGATIVE ng/mL (ref <50)
Lorazepam: NEGATIVE ng/mL (ref <50)
Marijuana Metabolite: NEGATIVE ng/mL (ref <20)
Nordiazepam: NEGATIVE ng/mL (ref <50)
Opiates: NEGATIVE ng/mL (ref <100)
Oxazepam: NEGATIVE ng/mL (ref <50)
Oxycodone: NEGATIVE ng/mL (ref <100)
Temazepam: NEGATIVE ng/mL (ref <50)
Tramadol: NEGATIVE ng/mL (ref <100)

## 2022-07-31 LAB — DM TEMPLATE

## 2022-08-04 ENCOUNTER — Other Ambulatory Visit: Payer: Self-pay | Admitting: Internal Medicine

## 2022-08-21 ENCOUNTER — Ambulatory Visit (INDEPENDENT_AMBULATORY_CARE_PROVIDER_SITE_OTHER): Payer: Medicare Other | Admitting: *Deleted

## 2022-08-21 DIAGNOSIS — Z0000 Encounter for general adult medical examination without abnormal findings: Secondary | ICD-10-CM

## 2022-08-21 NOTE — Progress Notes (Addendum)
Subjective:   Nicole Duran is a 71 y.o. female who presents for Medicare Annual (Subsequent) preventive examination.  I connected with  Nicole Duran on 08/21/22 by a audio enabled telemedicine application and verified that I am speaking with the correct person using two identifiers.  Patient Location: Home  Provider Location: Office/Clinic  I discussed the limitations of evaluation and management by telemedicine. The patient expressed understanding and agreed to proceed.   Review of Systems    Defer to PCP Cardiac Risk Factors include: advanced age (>76mn, >>98women);diabetes mellitus;dyslipidemia     Objective:    There were no vitals filed for this visit. There is no height or weight on file to calculate BMI.     08/21/2022    9:42 AM 08/19/2021    9:44 AM 05/26/2019   11:46 AM 05/18/2018    1:16 PM 04/19/2018    2:20 PM 02/27/2017    1:35 PM  Advanced Directives  Does Patient Have a Medical Advance Directive? Yes Yes No No Yes No  Type of AParamedicof Nicole Duran will Nicole Duran will      Does patient want to make changes to medical advance directive? No - Patient declined   Yes (MAU/Ambulatory/Procedural Areas - Information given) No - Patient declined   Copy of [place] Chart? No - copy requested Yes - validated most recent copy scanned in chart (See row information)      Would patient like information on creating a medical advance directive?   No - Patient declined   Yes (MAU/Ambulatory/Procedural Areas - Information given)    Current Medications (verified) Outpatient Encounter Medications as of 08/21/2022  Medication Sig   ALPRAZolam (XANAX) 0.5 MG tablet TAKE 1 TABLET BY MOUTH 4 TIMES A DAY AS NEEDED FOR ANXIETY   amLODipine (NORVASC) 5 MG tablet Take 1 tablet (5 mg total) by mouth daily.   atorvastatin (LIPITOR) 40 MG tablet Take 1 tablet (40 mg total) by mouth at bedtime.    Azelastine HCl 137 MCG/SPRAY SOLN PLACE 2 SPRAYS INTO BOTH NOSTRILS 2 (TWO) TIMES DAILY   Blood Glucose Monitoring Suppl (ONE TOUCH ULTRA 2) W/DEVICE KIT Use once daily to check blood sugar   Cetirizine HCl (ZYRTEC PO) Take 1 tablet by mouth daily as needed.   DiphenhydrAMINE HCl (BENADRYL PO) Take by mouth as needed. Reported on 02/14/2016   ibuprofen (ADVIL,MOTRIN) 200 MG tablet Take 200 mg by mouth every 6 (six) hours as needed.   JARDIANCE 25 MG TABS tablet Take 25 mg by mouth in the morning.   Multiple Vitamin (MULTIVITAMIN WITH MINERALS) TABS tablet Take 1 tablet by mouth daily.   ONE TOUCH ULTRA TEST test strip USE 1 STRIP DAILY AS DIRECTED.   OneTouch Delica Lancets 300BMISC Check blood sugars once daily   SitaGLIPtin-MetFORMIN HCl (JANUMET XR) [phone] MG TB24 Take 1 tablet by mouth every evening.   Triamcinolone Acetonide (NASACORT AQ NA) Place into the nose. Daily   Vitamin D, Cholecalciferol, 1000 units TABS Take 400 mg by mouth daily.   No facility-administered encounter medications on file as of 08/21/2022.    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   HYPERLIPIDEMIA 04/22/2007   Insomnia    Kidney stone    Menopause    Metabolic  syndrome    Past Surgical History:  Procedure Laterality Date   cataracts Bilateral 06/15 11/15   CHOLECYSTECTOMY     Family History  Problem Relation Age of Onset   Diabetes Mother    Dementia Mother        mother also had- PAD, psychosis   Hypertension Mother    Coronary artery disease Father 58       CABG   Stroke Father 61   Multiple myeloma Father 68   Colon cancer Neg Hx    Breast cancer Neg Hx    Social History   Socioeconomic History   Marital status: Widowed    Spouse name: Not on  file   Number of children: 1   Years of education: Not on file   Highest education level: Not on file  Occupational History   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, Research scientist (physical sciences): Nicole Duran  Tobacco Use   Smoking status: Former   Smokeless tobacco: Never   Tobacco comments:    never heavy use , quit in the 80s  Substance and Sexual Activity   Alcohol use: Yes    Alcohol/week: 0.0 standard drinks of alcohol    Comment: socially   Drug use: No   Sexual activity: Not Currently  Other Topics Concern   Not on file  Social History Narrative   Lives by herself, widow (Nicole Duran), 1 daughter social worker (Nicole Duran, [place])  , [place] mother 2014         Social Determinants of Health   Financial Resource Strain: Low Risk  (08/19/2021)   Overall Financial Resource Strain (CARDIA)    Difficulty of Paying Living Expenses: Not hard at all  Food Insecurity: No Food Insecurity (08/21/2022)   Hunger Vital Sign    Worried About Running Out of Food in the Last Year: Never true    [place] in the Last Year: Never true  Transportation Needs: No Transportation Needs (08/21/2022)   PRAPARE - Hydrologist (Medical): No    Lack of Transportation (Non-Medical): No  Physical Activity: Sufficiently Active (08/19/2021)   Exercise Vital Sign    Days of Exercise per Week: 7 days    Minutes of Exercise per Session: 30 min  Stress: No Stress Concern Present (08/19/2021)   [place]    Feeling of Stress : Not at all  Social Connections: Moderately Isolated (08/19/2021)   Social Connection and Isolation Panel [NHANES]    Frequency of Communication with Friends and Family: More than three times a week    Frequency of Social Gatherings with Friends and Family: More than three times a week    Attends Religious Services: Never    Marine scientist or Organizations: Yes     Attends Music therapist: More than 4 times per year    Marital Status: Widowed    Tobacco Counseling Counseling given: Not Answered Tobacco comments: never heavy use , quit in the 80s   Clinical Intake:  Pre-visit preparation completed: Yes  Pain : No/denies pain  How often do you need to have someone help you when you read instructions, pamphlets, or other written materials from your doctor or pharmacy?: 1 - Never  Diabetic? Nutrition Risk Assessment:  Has the patient had any N/V/D within the last 2 months?  No  Does the patient have any non-healing wounds?  No  Has the patient had any unintentional weight loss or weight gain?  No   Diabetes:  Is the patient diabetic?  Yes  If diabetic, was a CBG obtained today?  No  Did the patient bring in their glucometer from home?  No  How often do you monitor your CBG's? Once daily.   Financial Strains and Diabetes Management:  Are you having any financial strains with the device, your supplies or your medication? No .  Does the patient want to be seen by Chronic Care Management for management of their diabetes?  No  Would the patient like to be referred to a Nutritionist or for Diabetic Management?  No   Diabetic Exams:  Diabetic Eye Exam: Completed 01/20/22 Diabetic Foot Exam: Completed 12/23/21   Interpreter Needed?: No  Information entered by :: Beatris Ship, [place]   Activities of Daily Living    08/21/2022    9:50 AM  In your present state of health, do you have any difficulty performing the following activities:  Hearing? 0  Vision? 0  Comment wears readers  Difficulty concentrating or making decisions? 1  Comment slight memory loss  Walking or climbing stairs? 1  Dressing or bathing? 0  Doing errands, shopping? 0  Preparing Food and eating ? N  Using the Toilet? N  In the past six months, have you accidently leaked urine? Y  Comment couple times  Do you have problems with loss of bowel  control? N  Managing your Medications? N  Managing your Finances? N  Housekeeping or managing your Housekeeping? N    Patient Care Team: Colon Branch, MD as PCP - General Jacelyn Pi, MD as Consulting Physician (Endocrinology) Dene Gentry, MD as Consulting Physician ([place]) Allyn Kenner, DO as Consulting Physician (Obstetrics and Gynecology) Rutherford Guys, MD as Consulting Physician (Ophthalmology)  Indicate any recent Medical Services you may have received from other than [facility] providers in the past year (date may be approximate).     Assessment:   This is a routine wellness examination for Nicole Duran.  Hearing/Vision screen No results found.  Dietary issues and exercise activities discussed: Current Exercise Habits: Home exercise routine, Type of exercise: Other - see comments ([place]), Time (Minutes): 30, Frequency (Times/Week): 4, Weekly Exercise (Minutes/Week): 120, Intensity: Mild, Exercise limited by: orthopedic condition(s)   Goals Addressed   None    Depression Screen    08/21/2022    9:47 AM 07/29/2022    9:07 AM 08/19/2021    9:48 AM 07/19/2021    8:41 AM 07/17/2020    8:42 AM 05/26/2019   11:47 AM 05/18/2018    1:19 PM  PHQ 2/9 Scores  PHQ - 2 Score 0 0 0 0 0 0 0    Fall Risk    08/21/2022    9:43 AM 07/29/2022    9:07 AM 08/19/2021    9:46 AM 08/18/2021    5:48 PM 07/19/2021    8:41 AM  Fall Risk   Falls in the past year? 0 0 0 0 0  Number falls in past yr: 0 0 0  0  Injury with Fall? 0 0 0 0 0  Risk for fall due to : No Fall Risks      Follow up Falls evaluation completed Falls evaluation completed Falls prevention discussed  Falls evaluation completed    [place]:  Any stairs in or around the home? No  If so, are there any  without handrails? No  Home free of loose throw rugs in walkways, pet beds, electrical cords, etc? Yes  Adequate lighting in your home to reduce risk of falls? Yes    ASSISTIVE DEVICES UTILIZED TO PREVENT FALLS:  Life alert? No  Use of a cane, walker or w/c? No  Grab bars in the bathroom?  Yes, in the shower Shower chair or bench in shower? Yes  Elevated toilet seat or a handicapped toilet?  Comfort height  TIMED UP AND GO:  Was the test performed?  No, audio visit .    Cognitive Function:        08/21/2022    9:56 AM  6CIT Screen  What Year? 0 points  What month? 0 points  What time? 0 points  Count back from 20 0 points  Months in reverse 0 points  Repeat phrase 0 points  Total Score 0 points    Immunizations Immunization History  Administered Date(s) Administered   Fluad Quad(high Dose 65+) 05/26/2019, 06/01/2020   Influenza Split 06/01/2021   Influenza, High Dose Seasonal PF 06/07/2018, 06/24/2022   Influenza,inj,Quad PF,6+ Mos 06/05/2017   Influenza-Unspecified 06/02/2011, 06/01/2013, 06/01/2014, 06/02/2015, 06/01/2016   PFIZER Comirnaty(Gray Top)Covid-19 Tri-Sucrose Vaccine 03/28/2021   PFIZER(Purple Top)SARS-COV-2 Vaccination 10/09/2019, 11/03/2019, 06/29/2020   PNEUMOCOCCAL CONJUGATE-20 10/19/2021   Pfizer Covid-19 Vaccine Bivalent Booster 37yr & up 08/06/2021, 06/23/2022   Pneumococcal Conjugate-13 12/21/2014   Pneumococcal Polysaccharide-23 08/22/2013   Respiratory Syncytial Virus Vaccine,Recomb Aduvanted(Arexvy) 05/29/2022   Td 09/02/2003   Tetanus 01/03/2014   Zoster, Live 05/12/2012    TDAP status: Up to date  Flu Vaccine status: Up to date  Pneumococcal vaccine status: Up to date  Covid-19 vaccine status: Information provided on how to obtain vaccines.   Qualifies for Shingles Vaccine? Yes   Zostavax completed Yes   Shingrix Completed?: No.    Education has been provided regarding the importance of this vaccine. Patient has been advised to call insurance company to determine out of pocket expense if they have not yet received this vaccine. Advised may also receive vaccine at local pharmacy or Health  Dept. Verbalized acceptance and understanding.  Screening Tests Health Maintenance  Topic Date Due   Zoster Vaccines- Shingrix (1 of 2) Never done   Diabetic kidney evaluation - Urine ACR  07/22/2016   COVID-19 Vaccine (7 - 2023-24 season) 08/18/2022   Medicare Annual Wellness (AWV)  08/19/2022   FOOT EXAM  12/24/2022   HEMOGLOBIN A1C  12/24/2022   OPHTHALMOLOGY EXAM  01/21/2023   MAMMOGRAM  05/28/2023   Diabetic kidney evaluation - eGFR measurement  07/30/2023   DTaP/Tdap/Td (3 - Tdap) 01/04/2024   Pneumonia Vaccine 71 Years old  Completed   INFLUENZA VACCINE  Completed   DEXA SCAN  Completed   Hepatitis C Screening  Completed   HPV VACCINES  Aged Out   COLONOSCOPY (Pts 45-465yrInsurance coverage will need to be confirmed)  Discontinued    Health Maintenance  Health Maintenance Due  Topic Date Due   Zoster Vaccines- Shingrix (1 of 2) Never done   Diabetic kidney evaluation - Urine ACR  07/22/2016   COVID-19 Vaccine (7 - 2023-24 season) 08/18/2022   Medicare Annual Wellness (AWV)  08/19/2022    Colorectal cancer screening: No longer required. Pt preference   Mammogram status: Completed 05/27/22. Repeat every year  Bone Density status: Completed 01/04/21. Results reflect: Bone density results: OSTEOPENIA. Repeat every 2 years.  Lung Cancer Screening: (Low Dose CT Chest recommended if Age 73-80ears,  30 pack-year currently smoking OR have quit w/in 15years.) does not qualify.   Additional Screening:  Hepatitis C Screening: does qualify; Completed 08/14/16  Vision Screening: Recommended annual ophthalmology exams for early detection of glaucoma and other disorders of the eye. Is the patient up to date with their annual eye exam?  Yes  Who is the provider or what is the name of the office in which the patient attends annual eye exams? [place] If pt is not established with a provider, would they like to be referred to a provider to establish care? No .   Dental  Screening: Recommended annual dental exams for proper oral hygiene  Community Resource Referral / Chronic Care Management: CRR required this visit?  No   CCM required this visit?  No      Plan:     I have personally reviewed and noted the following in the patient's chart:   Medical and social history Use of alcohol, tobacco or illicit drugs  Current medications and supplements including opioid prescriptions. Patient is not currently taking opioid prescriptions. Functional ability and status Nutritional status Physical activity Advanced directives List of other physicians Hospitalizations, surgeries, and ER visits in previous 12 months Vitals Screenings to include cognitive, depression, and falls Referrals and appointments  In addition, I have reviewed and discussed with patient certain preventive protocols, quality metrics, and best practice recommendations. A written personalized care plan for preventive services as well as general preventive health recommendations were provided to patient.   Due to this being a telephonic visit, the after visit summary with patients personalized plan was offered to patient via mail or my-chart.  Patient would like to access on my-chart and was mailed a copy of AVS.   Beatris Ship, [place]   08/21/2022   Nurse Notes: None   I have reviewed and agree with [facility] documentation.  Kathlene November, MD

## 2022-08-21 NOTE — Patient Instructions (Signed)
Nicole Duran , Thank you for taking time to come for your Medicare Wellness Visit. I appreciate your ongoing commitment to your health goals. Please review the following plan we discussed and let me know if I can assist you in the future.   These are the goals we discussed:  Goals       Maintain healthy lifestyle (pt-stated)      Patient Stated      Increase exercise        This is a list of the screening recommended for you and due dates:  Health Maintenance  Topic Date Due   Zoster (Shingles) Vaccine (1 of 2) Never done   Yearly kidney health urinalysis for diabetes  07/22/2016   COVID-19 Vaccine (7 - 2023-24 season) 08/18/2022   Complete foot exam   12/24/2022   Hemoglobin A1C  12/24/2022   Eye exam for diabetics  01/21/2023   Mammogram  05/28/2023   Yearly kidney function blood test for diabetes  07/30/2023   Medicare Annual Wellness Visit  08/22/2023   DTaP/Tdap/Td vaccine (3 - Tdap) 01/04/2024   Pneumonia Vaccine  Completed   Flu Shot  Completed   DEXA scan (bone density measurement)  Completed   Hepatitis C Screening: USPSTF Recommendation to screen - Ages 48-79 yo.  Completed   HPV Vaccine  Aged Out   Colon Cancer Screening  Discontinued     Next appointment: Follow up in one year for your annual wellness visit.   Preventive Care 51 Years and Older, Female Preventive care refers to lifestyle choices and visits with your health care provider that can promote health and wellness. What does preventive care include? A yearly physical exam. This is also called an annual well check. Dental exams once or twice a year. Routine eye exams. Ask your health care provider how often you should have your eyes checked. Personal lifestyle choices, including: Daily care of your teeth and gums. Regular physical activity. Eating a healthy diet. Avoiding tobacco and drug use. Limiting alcohol use. Practicing safe sex. Taking low-dose aspirin every day. Taking vitamin and  mineral supplements as recommended by your health care provider. What happens during an annual well check? The services and screenings done by your health care provider during your annual well check will depend on your age, overall health, lifestyle risk factors, and family history of disease. Counseling  Your health care provider may ask you questions about your: Alcohol use. Tobacco use. Drug use. Emotional well-being. Home and relationship well-being. Sexual activity. Eating habits. History of falls. Memory and ability to understand (cognition). Work and work Statistician. Reproductive health. Screening  You may have the following tests or measurements: Height, weight, and BMI. Blood pressure. Lipid and cholesterol levels. These may be checked every 5 years, or more frequently if you are over 4 years old. Skin check. Lung cancer screening. You may have this screening every year starting at age 22 if you have a 30-pack-year history of smoking and currently smoke or have quit within the past 15 years. Fecal occult blood test (FOBT) of the stool. You may have this test every year starting at age 22. Flexible sigmoidoscopy or colonoscopy. You may have a sigmoidoscopy every 5 years or a colonoscopy every 10 years starting at age 97. Hepatitis C blood test. Hepatitis B blood test. Sexually transmitted disease (STD) testing. Diabetes screening. This is done by checking your blood sugar (glucose) after you have not eaten for a while (fasting). You may have this done every  1-3 years. Bone density scan. This is done to screen for osteoporosis. You may have this done starting at age 8. Mammogram. This may be done every 1-2 years. Talk to your health care provider about how often you should have regular mammograms. Talk with your health care provider about your test results, treatment options, and if necessary, the need for more tests. Vaccines  Your health care provider may recommend  certain vaccines, such as: Influenza vaccine. This is recommended every year. Tetanus, diphtheria, and acellular pertussis (Tdap, Td) vaccine. You may need a Td booster every 10 years. Zoster vaccine. You may need this after age 58. Pneumococcal 13-valent conjugate (PCV13) vaccine. One dose is recommended after age 65. Pneumococcal polysaccharide (PPSV23) vaccine. One dose is recommended after age 79. Talk to your health care provider about which screenings and vaccines you need and how often you need them. This information is not intended to replace advice given to you by your health care provider. Make sure you discuss any questions you have with your health care provider. Document Released: 09/14/2015 Document Revised: 05/07/2016 Document Reviewed: 06/19/2015 Elsevier Interactive Patient Education  2017 [employer] Prevention in the Home Falls can cause injuries. They can happen to people of all ages. There are many things you can do to make your home safe and to help prevent falls. What can I do on the outside of my home? Regularly fix the edges of walkways and driveways and fix any cracks. Remove anything that might make you trip as you walk through a door, such as a raised step or threshold. Trim any bushes or trees on the path to your home. Use bright outdoor lighting. Clear any walking paths of anything that might make someone trip, such as rocks or tools. Regularly check to see if handrails are loose or broken. Make sure that both sides of any steps have handrails. Any raised decks and porches should have guardrails on the edges. Have any leaves, snow, or ice cleared regularly. Use sand or salt on walking paths during winter. Clean up any spills in your garage right away. This includes oil or grease spills. What can I do in the bathroom? Use night lights. Install grab bars by the toilet and in the tub and shower. Do not use towel bars as grab bars. Use non-skid mats or  decals in the tub or shower. If you need to sit down in the shower, use a plastic, non-slip stool. Keep the floor dry. Clean up any water that spills on the floor as soon as it happens. Remove soap buildup in the tub or shower regularly. Attach bath mats securely with double-sided non-slip rug tape. Do not have throw rugs and other things on the floor that can make you trip. What can I do in the bedroom? Use night lights. Make sure that you have a light by your bed that is easy to reach. Do not use any sheets or blankets that are too big for your bed. They should not hang down onto the floor. Have a firm chair that has side arms. You can use this for support while you get dressed. Do not have throw rugs and other things on the floor that can make you trip. What can I do in the kitchen? Clean up any spills right away. Avoid walking on wet floors. Keep items that you use a lot in easy-to-reach places. If you need to reach something above you, use a strong step stool that has a  grab bar. Keep electrical cords out of the way. Do not use floor polish or wax that makes floors slippery. If you must use wax, use non-skid floor wax. Do not have throw rugs and other things on the floor that can make you trip. What can I do with my stairs? Do not leave any items on the stairs. Make sure that there are handrails on both sides of the stairs and use them. Fix handrails that are broken or loose. Make sure that handrails are as long as the stairways. Check any carpeting to make sure that it is firmly attached to the stairs. Fix any carpet that is loose or worn. Avoid having throw rugs at the top or bottom of the stairs. If you do have throw rugs, attach them to the floor with carpet tape. Make sure that you have a light switch at the top of the stairs and the bottom of the stairs. If you do not have them, ask someone to add them for you. What else can I do to help prevent falls? Wear shoes that: Do not  have high heels. Have rubber bottoms. Are comfortable and fit you well. Are closed at the toe. Do not wear sandals. If you use a stepladder: Make sure that it is fully opened. Do not climb a closed stepladder. Make sure that both sides of the stepladder are locked into place. Ask someone to hold it for you, if possible. Clearly mark and make sure that you can see: Any grab bars or handrails. First and last steps. Where the edge of each step is. Use tools that help you move around (mobility aids) if they are needed. These include: Canes. Walkers. Scooters. Crutches. Turn on the lights when you go into a dark area. Replace any light bulbs as soon as they burn out. Set up your furniture so you have a clear path. Avoid moving your furniture around. If any of your floors are uneven, fix them. If there are any pets around you, be aware of where they are. Review your medicines with your doctor. Some medicines can make you feel dizzy. This can increase your chance of falling. Ask your doctor what other things that you can do to help prevent falls. This information is not intended to replace advice given to you by your health care provider. Make sure you discuss any questions you have with your health care provider. Document Released: 06/14/2009 Document Revised: 01/24/2016 Document Reviewed: 09/22/2014 Elsevier Interactive Patient Education  2017 [employer].

## 2022-08-29 DIAGNOSIS — L820 Inflamed seborrheic keratosis: Secondary | ICD-10-CM | POA: Diagnosis not present

## 2022-08-29 DIAGNOSIS — L821 Other seborrheic keratosis: Secondary | ICD-10-CM | POA: Diagnosis not present

## 2022-08-29 DIAGNOSIS — Z85828 Personal history of other malignant neoplasm of skin: Secondary | ICD-10-CM | POA: Diagnosis not present

## 2022-08-29 DIAGNOSIS — D225 Melanocytic nevi of trunk: Secondary | ICD-10-CM | POA: Diagnosis not present

## 2022-08-29 DIAGNOSIS — L814 Other melanin hyperpigmentation: Secondary | ICD-10-CM | POA: Diagnosis not present

## 2022-08-29 DIAGNOSIS — D1801 Hemangioma of skin and subcutaneous tissue: Secondary | ICD-10-CM | POA: Diagnosis not present

## 2022-09-15 ENCOUNTER — Other Ambulatory Visit: Payer: Self-pay | Admitting: Internal Medicine

## 2022-10-30 ENCOUNTER — Other Ambulatory Visit: Payer: Self-pay | Admitting: Internal Medicine

## 2022-12-25 DIAGNOSIS — E1165 Type 2 diabetes mellitus with hyperglycemia: Secondary | ICD-10-CM | POA: Diagnosis not present

## 2022-12-25 DIAGNOSIS — E7800 Pure hypercholesterolemia, unspecified: Secondary | ICD-10-CM | POA: Diagnosis not present

## 2022-12-25 LAB — HM DIABETES FOOT EXAM: HM Diabetic Foot Exam: NORMAL

## 2022-12-25 LAB — LIPID PANEL
Cholesterol: 160 (ref 0–200)
HDL: 48 (ref 35–70)
LDL Cholesterol: 82
Triglycerides: 174 — AB (ref 40–160)

## 2022-12-25 LAB — BASIC METABOLIC PANEL
BUN: 13 (ref 4–21)
CO2: 20 (ref 13–22)
Chloride: 106 (ref 99–108)
Creatinine: 0.8 (ref 0.5–1.1)
Glucose: 154
Potassium: 4.4 mEq/L (ref 3.5–5.1)
Sodium: 143 (ref 137–147)

## 2022-12-25 LAB — HEPATIC FUNCTION PANEL
ALT: 24 U/L (ref 7–35)
AST: 16 (ref 13–35)
Alkaline Phosphatase: 95 (ref 25–125)
Bilirubin, Total: 0.3

## 2022-12-25 LAB — PROTEIN / CREATININE RATIO, URINE
Albumin, U: 34.8
Creatinine, Urine: 68.7

## 2022-12-25 LAB — COMPREHENSIVE METABOLIC PANEL
Albumin: 4.3 (ref 3.5–5.0)
Calcium: 9.7 (ref 8.7–10.7)
Globulin: 2.4
eGFR: 82

## 2022-12-25 LAB — HEMOGLOBIN A1C: Hemoglobin A1C: 7.2

## 2022-12-31 ENCOUNTER — Other Ambulatory Visit: Payer: Self-pay | Admitting: Internal Medicine

## 2022-12-31 DIAGNOSIS — I10 Essential (primary) hypertension: Secondary | ICD-10-CM

## 2023-01-01 DIAGNOSIS — R011 Cardiac murmur, unspecified: Secondary | ICD-10-CM | POA: Diagnosis not present

## 2023-01-01 DIAGNOSIS — E1165 Type 2 diabetes mellitus with hyperglycemia: Secondary | ICD-10-CM | POA: Diagnosis not present

## 2023-01-01 DIAGNOSIS — E7800 Pure hypercholesterolemia, unspecified: Secondary | ICD-10-CM | POA: Diagnosis not present

## 2023-01-01 DIAGNOSIS — R809 Proteinuria, unspecified: Secondary | ICD-10-CM | POA: Diagnosis not present

## 2023-01-01 DIAGNOSIS — I10 Essential (primary) hypertension: Secondary | ICD-10-CM | POA: Diagnosis not present

## 2023-01-02 ENCOUNTER — Encounter: Payer: Self-pay | Admitting: Internal Medicine

## 2023-01-03 ENCOUNTER — Telehealth: Payer: Self-pay | Admitting: Internal Medicine

## 2023-01-05 NOTE — Telephone Encounter (Signed)
PDMP okay, Rx sent 

## 2023-01-05 NOTE — Telephone Encounter (Signed)
Requesting: alprazolam 0.5mg   Contract: 07/29/22 UDS: 07/29/22 Last Visit: 07/29/22 Next Visit: None Last Refill: 07/14/22 #120 and 0RF   Please Advise

## 2023-01-20 DIAGNOSIS — Z961 Presence of intraocular lens: Secondary | ICD-10-CM | POA: Diagnosis not present

## 2023-01-20 DIAGNOSIS — E119 Type 2 diabetes mellitus without complications: Secondary | ICD-10-CM | POA: Diagnosis not present

## 2023-01-20 DIAGNOSIS — Z7984 Long term (current) use of oral hypoglycemic drugs: Secondary | ICD-10-CM | POA: Diagnosis not present

## 2023-01-20 LAB — HM DIABETES EYE EXAM

## 2023-01-22 ENCOUNTER — Encounter: Payer: Self-pay | Admitting: Internal Medicine

## 2023-04-27 ENCOUNTER — Other Ambulatory Visit: Payer: Self-pay | Admitting: Internal Medicine

## 2023-06-01 DIAGNOSIS — Z1231 Encounter for screening mammogram for malignant neoplasm of breast: Secondary | ICD-10-CM | POA: Diagnosis not present

## 2023-06-01 LAB — HM MAMMOGRAPHY

## 2023-06-12 ENCOUNTER — Encounter: Payer: Self-pay | Admitting: Internal Medicine

## 2023-06-28 ENCOUNTER — Other Ambulatory Visit: Payer: Self-pay | Admitting: Internal Medicine

## 2023-06-28 DIAGNOSIS — I10 Essential (primary) hypertension: Secondary | ICD-10-CM

## 2023-07-06 DIAGNOSIS — E1165 Type 2 diabetes mellitus with hyperglycemia: Secondary | ICD-10-CM | POA: Diagnosis not present

## 2023-07-06 LAB — HEMOGLOBIN A1C: Hemoglobin A1C: 7.4

## 2023-07-08 ENCOUNTER — Encounter: Payer: Self-pay | Admitting: Internal Medicine

## 2023-07-13 DIAGNOSIS — E7800 Pure hypercholesterolemia, unspecified: Secondary | ICD-10-CM | POA: Diagnosis not present

## 2023-07-13 DIAGNOSIS — R809 Proteinuria, unspecified: Secondary | ICD-10-CM | POA: Diagnosis not present

## 2023-07-13 DIAGNOSIS — E1165 Type 2 diabetes mellitus with hyperglycemia: Secondary | ICD-10-CM | POA: Diagnosis not present

## 2023-07-13 DIAGNOSIS — I10 Essential (primary) hypertension: Secondary | ICD-10-CM | POA: Diagnosis not present

## 2023-07-29 ENCOUNTER — Other Ambulatory Visit: Payer: Self-pay | Admitting: Internal Medicine

## 2023-08-01 ENCOUNTER — Other Ambulatory Visit (HOSPITAL_COMMUNITY): Payer: Self-pay

## 2023-08-11 DIAGNOSIS — Z85828 Personal history of other malignant neoplasm of skin: Secondary | ICD-10-CM | POA: Diagnosis not present

## 2023-08-11 DIAGNOSIS — D225 Melanocytic nevi of trunk: Secondary | ICD-10-CM | POA: Diagnosis not present

## 2023-08-11 DIAGNOSIS — D1801 Hemangioma of skin and subcutaneous tissue: Secondary | ICD-10-CM | POA: Diagnosis not present

## 2023-08-11 DIAGNOSIS — D2239 Melanocytic nevi of other parts of face: Secondary | ICD-10-CM | POA: Diagnosis not present

## 2023-08-11 DIAGNOSIS — L570 Actinic keratosis: Secondary | ICD-10-CM | POA: Diagnosis not present

## 2023-08-11 DIAGNOSIS — L814 Other melanin hyperpigmentation: Secondary | ICD-10-CM | POA: Diagnosis not present

## 2023-08-11 DIAGNOSIS — L820 Inflamed seborrheic keratosis: Secondary | ICD-10-CM | POA: Diagnosis not present

## 2023-08-11 DIAGNOSIS — B351 Tinea unguium: Secondary | ICD-10-CM | POA: Diagnosis not present

## 2023-08-11 DIAGNOSIS — L821 Other seborrheic keratosis: Secondary | ICD-10-CM | POA: Diagnosis not present

## 2023-08-11 DIAGNOSIS — C44612 Basal cell carcinoma of skin of right upper limb, including shoulder: Secondary | ICD-10-CM | POA: Diagnosis not present

## 2023-08-19 DIAGNOSIS — C4491 Basal cell carcinoma of skin, unspecified: Secondary | ICD-10-CM | POA: Insufficient documentation

## 2023-08-31 ENCOUNTER — Ambulatory Visit (INDEPENDENT_AMBULATORY_CARE_PROVIDER_SITE_OTHER): Payer: Medicare Other

## 2023-08-31 DIAGNOSIS — Z0000 Encounter for general adult medical examination without abnormal findings: Secondary | ICD-10-CM

## 2023-08-31 NOTE — Patient Instructions (Signed)
Nicole Duran , Thank you for taking time to come for your Medicare Wellness Visit. I appreciate your ongoing commitment to your health goals. Please review the following plan we discussed and let me know if I can assist you in the future.     This is a list of the screening recommended for you and due dates:  Health Maintenance  Topic Date Due   Zoster (Shingles) Vaccine (1 of 2) 12/07/1967   COVID-19 Vaccine (8 - 2024-25 season) 08/07/2023   Yearly kidney function blood test for diabetes  12/25/2023   Yearly kidney health urinalysis for diabetes  12/25/2023   Complete foot exam   12/25/2023   Hemoglobin A1C  01/03/2024   DTaP/Tdap/Td vaccine (3 - Tdap) 01/04/2024   Eye exam for diabetics  01/20/2024   Mammogram  05/27/2024   Medicare Annual Wellness Visit  08/30/2024   Pneumonia Vaccine  Completed   Flu Shot  Completed   DEXA scan (bone density measurement)  Completed   Hepatitis C Screening  Completed   HPV Vaccine  Aged Out   Colon Cancer Screening  Discontinued    Next appointment: Follow up in one year for your annual wellness visit.   Preventive Care 10 Years and Older, Female Preventive care refers to lifestyle choices and visits with your health care provider that can promote health and wellness. What does preventive care include? A yearly physical exam. This is also called an annual well check. Dental exams once or twice a year. Routine eye exams. Ask your health care provider how often you should have your eyes checked. Personal lifestyle choices, including: Daily care of your teeth and gums. Regular physical activity. Eating a healthy diet. Avoiding tobacco and drug use. Limiting alcohol use. Practicing safe sex. Taking low-dose aspirin every day. Taking vitamin and mineral supplements as recommended by your health care provider. What happens during an annual well check? The services and screenings done by your health care provider during your annual well  check will depend on your age, overall health, lifestyle risk factors, and family history of disease. Counseling  Your health care provider may ask you questions about your: Alcohol use. Tobacco use. Drug use. Emotional well-being. Home and relationship well-being. Sexual activity. Eating habits. History of falls. Memory and ability to understand (cognition). Work and work Astronomer. Reproductive health. Screening  You may have the following tests or measurements: Height, weight, and BMI. Blood pressure. Lipid and cholesterol levels. These may be checked every 5 years, or more frequently if you are over 73 years old. Skin check. Lung cancer screening. You may have this screening every year starting at age 31 if you have a 30-pack-year history of smoking and currently smoke or have quit within the past 15 years. Fecal occult blood test (FOBT) of the stool. You may have this test every year starting at age 49. Flexible sigmoidoscopy or colonoscopy. You may have a sigmoidoscopy every 5 years or a colonoscopy every 10 years starting at age 77. Hepatitis C blood test. Hepatitis B blood test. Sexually transmitted disease (STD) testing. Diabetes screening. This is done by checking your blood sugar (glucose) after you have not eaten for a while (fasting). You may have this done every 1-3 years. Bone density scan. This is done to screen for osteoporosis. You may have this done starting at age 26. Mammogram. This may be done every 1-2 years. Talk to your health care provider about how often you should have regular mammograms. Talk with your health  care provider about your test results, treatment options, and if necessary, the need for more tests. Vaccines  Your health care provider may recommend certain vaccines, such as: Influenza vaccine. This is recommended every year. Tetanus, diphtheria, and acellular pertussis (Tdap, Td) vaccine. You may need a Td booster every 10 years. Zoster  vaccine. You may need this after age 3. Pneumococcal 13-valent conjugate (PCV13) vaccine. One dose is recommended after age 62. Pneumococcal polysaccharide (PPSV23) vaccine. One dose is recommended after age 72. Talk to your health care provider about which screenings and vaccines you need and how often you need them. This information is not intended to replace advice given to you by your health care provider. Make sure you discuss any questions you have with your health care provider. Document Released: 09/14/2015 Document Revised: 05/07/2016 Document Reviewed: 06/19/2015 Elsevier Interactive Patient Education  2017 [employer].  Fall Prevention in the Home Falls can cause injuries. They can happen to people of all ages. There are many things you can do to make your home safe and to help prevent falls. What can I do on the outside of my home? Regularly fix the edges of walkways and driveways and fix any cracks. Remove anything that might make you trip as you walk through a door, such as a raised step or threshold. Trim any bushes or trees on the path to your home. Use bright outdoor lighting. Clear any walking paths of anything that might make someone trip, such as rocks or tools. Regularly check to see if handrails are loose or broken. Make sure that both sides of any steps have handrails. Any raised decks and porches should have guardrails on the edges. Have any leaves, snow, or ice cleared regularly. Use sand or salt on walking paths during winter. Clean up any spills in your garage right away. This includes oil or grease spills. What can I do in the bathroom? Use night lights. Install grab bars by the toilet and in the tub and shower. Do not use towel bars as grab bars. Use non-skid mats or decals in the tub or shower. If you need to sit down in the shower, use a plastic, non-slip stool. Keep the floor dry. Clean up any water that spills on the floor as soon as it happens. Remove  soap buildup in the tub or shower regularly. Attach bath mats securely with double-sided non-slip rug tape. Do not have throw rugs and other things on the floor that can make you trip. What can I do in the bedroom? Use night lights. Make sure that you have a light by your bed that is easy to reach. Do not use any sheets or blankets that are too big for your bed. They should not hang down onto the floor. Have a firm chair that has side arms. You can use this for support while you get dressed. Do not have throw rugs and other things on the floor that can make you trip. What can I do in the kitchen? Clean up any spills right away. Avoid walking on wet floors. Keep items that you use a lot in easy-to-reach places. If you need to reach something above you, use a strong step stool that has a grab bar. Keep electrical cords out of the way. Do not use floor polish or wax that makes floors slippery. If you must use wax, use non-skid floor wax. Do not have throw rugs and other things on the floor that can make you trip. What can  I do with my stairs? Do not leave any items on the stairs. Make sure that there are handrails on both sides of the stairs and use them. Fix handrails that are broken or loose. Make sure that handrails are as long as the stairways. Check any carpeting to make sure that it is firmly attached to the stairs. Fix any carpet that is loose or worn. Avoid having throw rugs at the top or bottom of the stairs. If you do have throw rugs, attach them to the floor with carpet tape. Make sure that you have a light switch at the top of the stairs and the bottom of the stairs. If you do not have them, ask someone to add them for you. What else can I do to help prevent falls? Wear shoes that: Do not have high heels. Have rubber bottoms. Are comfortable and fit you well. Are closed at the toe. Do not wear sandals. If you use a stepladder: Make sure that it is fully opened. Do not climb a  closed stepladder. Make sure that both sides of the stepladder are locked into place. Ask someone to hold it for you, if possible. Clearly mark and make sure that you can see: Any grab bars or handrails. First and last steps. Where the edge of each step is. Use tools that help you move around (mobility aids) if they are needed. These include: Canes. Walkers. Scooters. Crutches. Turn on the lights when you go into a dark area. Replace any light bulbs as soon as they burn out. Set up your furniture so you have a clear path. Avoid moving your furniture around. If any of your floors are uneven, fix them. If there are any pets around you, be aware of where they are. Review your medicines with your doctor. Some medicines can make you feel dizzy. This can increase your chance of falling. Ask your doctor what other things that you can do to help prevent falls. This information is not intended to replace advice given to you by your health care provider. Make sure you discuss any questions you have with your health care provider. Document Released: 06/14/2009 Document Revised: 01/24/2016 Document Reviewed: 09/22/2014 Elsevier Interactive Patient Education  2017 [employer].

## 2023-08-31 NOTE — Progress Notes (Signed)
Subjective:   Nicole Duran is a 72 y.o. female who presents for Medicare Annual (Subsequent) preventive examination.  Visit Complete: Virtual I connected with  Maisie Fus on 08/31/23 by a audio enabled telemedicine application and verified that I am speaking with the correct person using two identifiers.  Patient Location: Home  Provider Location: Office/Clinic  I discussed the limitations of evaluation and management by telemedicine. The patient expressed understanding and agreed to proceed.  Vital Signs: Because this visit was a virtual/telehealth visit, some criteria may be missing or patient reported. Any vitals not documented were not able to be obtained and vitals that have been documented are patient reported.  Patient Medicare AWV questionnaire was completed by the patient on 08/30/23; I have confirmed that all information answered by patient is correct and no changes since this date.  Cardiac Risk Factors include: advanced age (>63men, >35 women);diabetes mellitus;dyslipidemia     Objective:    There were no vitals filed for this visit. There is no height or weight on file to calculate BMI.     08/31/2023    9:04 AM 08/21/2022    9:42 AM 08/19/2021    9:44 AM 05/26/2019   11:46 AM 05/18/2018    1:16 PM 04/19/2018    2:20 PM 02/27/2017    1:35 PM  Advanced Directives  Does Patient Have a Medical Advance Directive? Yes Yes Yes No No Yes No  Type of Estate agent of [place];Living will Healthcare Power of [place];Living will Healthcare Power of [place];Living will      Does patient want to make changes to medical advance directive? No - Patient declined No - Patient declined   Yes (MAU/Ambulatory/Procedural Areas - Information given) No - Patient declined   Copy of Healthcare Power of Attorney in Chart? No - copy requested No - copy requested Yes - validated most recent copy scanned in chart (See row information)      Would patient like  information on creating a medical advance directive?    No - Patient declined   Yes (MAU/Ambulatory/Procedural Areas - Information given)    Current Medications (verified) Outpatient Encounter Medications as of 08/31/2023  Medication Sig   ALPRAZolam (XANAX) 0.5 MG tablet TAKE 1 TABLET BY MOUTH 4 TIMES A DAY AS NEEDED FOR ANXIETY   amLODipine (NORVASC) 5 MG tablet Take 1 tablet (5 mg total) by mouth daily.   atorvastatin (LIPITOR) 40 MG tablet Take 1 tablet (40 mg total) by mouth at bedtime.   Azelastine HCl 137 MCG/SPRAY SOLN Place 2 sprays into both nostrils in the morning and at bedtime.   Blood Glucose Monitoring Suppl (ONE TOUCH ULTRA 2) W/DEVICE KIT Use once daily to check blood sugar   Cetirizine HCl (ZYRTEC PO) Take 1 tablet by mouth daily as needed.   DiphenhydrAMINE HCl (BENADRYL PO) Take by mouth as needed. Reported on 02/14/2016   ibuprofen (ADVIL,MOTRIN) 200 MG tablet Take 200 mg by mouth every 6 (six) hours as needed.   JARDIANCE 25 MG TABS tablet Take 25 mg by mouth in the morning.   Lancets (ONETOUCH DELICA PLUS LANCET33G) MISC USE TO CHECK BLOOD SUGARS ONCE DAILY   Multiple Vitamin (MULTIVITAMIN WITH MINERALS) TABS tablet Take 1 tablet by mouth daily.   ONE TOUCH ULTRA TEST test strip USE 1 STRIP DAILY AS DIRECTED.   SitaGLIPtin-MetFORMIN HCl (JANUMET XR) [phone] MG TB24 Take 1 tablet by mouth every evening.   Triamcinolone Acetonide (NASACORT AQ NA) Place into the nose.  Daily   Vitamin D, Cholecalciferol, 1000 units TABS Take 400 mg by mouth daily.   No facility-administered encounter medications on file as of 08/31/2023.    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   Arthritis    BCC (basal cell carcinoma of skin)     Heart murmur    HYPERLIPIDEMIA 04/22/2007   Insomnia    Kidney stone    Menopause    Metabolic syndrome    Past Surgical History:  Procedure Laterality Date   cataracts Bilateral 06/15 11/15   CHOLECYSTECTOMY     Family History  Problem Relation Age of Onset   Diabetes Mother    Dementia Mother        mother also had- PAD, psychosis   Hypertension Mother    Coronary artery disease Father 71       CABG   Stroke Father 20   Multiple myeloma Father 21   Colon cancer Neg Hx    Breast cancer Neg Hx    Social History   Socioeconomic History   Marital status: Widowed    Spouse name: Not on file   Number of children: 1   Years of education: Not on file   Highest education level: Associate degree: occupational, Scientist, product/process development, or vocational program  Occupational History   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[place], Nutritional therapist: [employer]  Tobacco Use   Smoking status: Former   Smokeless tobacco: Never   Tobacco comments:    never heavy use , quit in the 80s  Substance and Sexual Activity   Alcohol use: Yes    Comment: 1 glass every few months   Drug use: No   Sexual activity: Not Currently  Other Topics Concern   Not on file  Social History Narrative   Lives by herself, widow (1990), 1 daughter Child psychotherapist ([place], [place])  , 1 Barnetta Hammersmith    Lost mother 2014         Social Drivers of Corporate investment banker Strain: Low Risk  (08/30/2023)   Overall Financial Resource Strain (CARDIA)    Difficulty of Paying Living Expenses: Not hard at all  Food Insecurity: No Food Insecurity (08/30/2023)   Hunger Vital Sign    Worried About Running Out of Food in the Last Year: Never true    Ran Out of Food in the Last Year: Never true  Transportation Needs: No Transportation Needs (08/30/2023)   PRAPARE - Administrator, Civil Service (Medical): No    Lack of Transportation (Non-Medical): No  Physical Activity: Sufficiently Active (08/30/2023)    Exercise Vital Sign    Days of Exercise per Week: 3 days    Minutes of Exercise per Session: 50 min  Stress: No Stress Concern Present (08/30/2023)   [employer] of Occupational Health - Occupational Stress Questionnaire    Feeling of Stress : Not at all  Social Connections: Unknown (08/30/2023)   Social Connection and Isolation Panel [NHANES]    Frequency of Communication with Friends and Family: More than three times a week    Frequency of Social Gatherings with Friends and Family: Once a week    Attends Religious Services: Patient declined    Database administrator or Organizations: Yes    Attends Club or  Organization Meetings: More than 4 times per year    Marital Status: Widowed    Tobacco Counseling Counseling given: Not Answered Tobacco comments: never heavy use , quit in the 80s   Clinical Intake:  Pre-visit preparation completed: Yes  Pain : No/denies pain  Diabetes: Yes CBG done?: No Did pt. bring in CBG monitor from home?: No  How often do you need to have someone help you when you read instructions, pamphlets, or other written materials from your doctor or pharmacy?: 1 - Never  Interpreter Needed?: No  Information entered by :: Donne Anon, CMA   Activities of Daily Living    08/30/2023    5:20 PM 08/21/2023    5:30 PM  In your present state of health, do you have any difficulty performing the following activities:  Hearing? 0 0  Vision? 0 0  Difficulty concentrating or making decisions? 0 0  Walking or climbing stairs? 0 0  Dressing or bathing? 0 0  Doing errands, shopping? 0 0  Preparing Food and eating ? N N  Using the Toilet? N N  In the past six months, have you accidently leaked urine? Y Y  Do you have problems with loss of bowel control? N N  Managing your Medications? N N  Managing your Finances? N N  Housekeeping or managing your Housekeeping? N N    Patient Care Team: Wanda Plump, MD as PCP - General Dorisann Frames, MD as  Consulting Physician (Endocrinology) Lenda Kelp, MD as Consulting Physician (Sports Medicine) Philip Aspen, DO as Consulting Physician (Obstetrics and Gynecology) Jethro Bolus, MD as Consulting Physician (Ophthalmology)  Indicate any recent Medical Services you may have received from other than [facility] providers in the past year (date may be approximate).     Assessment:   This is a routine wellness examination for Nicole Duran.  Hearing/Vision screen No results found.   Goals Addressed   None    Depression Screen    08/31/2023    9:07 AM 08/21/2022    9:47 AM 07/29/2022    9:07 AM 08/19/2021    9:48 AM 07/19/2021    8:41 AM 07/17/2020    8:42 AM 05/26/2019   11:47 AM  PHQ 2/9 Scores  PHQ - 2 Score 0 0 0 0 0 0 0    Fall Risk    08/30/2023    5:20 PM 08/21/2023    5:30 PM 08/21/2022    9:43 AM 07/29/2022    9:07 AM 08/19/2021    9:46 AM  Fall Risk   Falls in the past year? 0 0 0 0 0  Number falls in past yr: 0 0 0 0 0  Injury with Fall? 0 0 0 0 0  Risk for fall due to : No Fall Risks  No Fall Risks    Follow up Falls evaluation completed  Falls evaluation completed Falls evaluation completed Falls prevention discussed    MEDICARE RISK AT HOME: Medicare Risk at Home Any stairs in or around the home?: No If so, are there any without handrails?: No Home free of loose throw rugs in walkways, pet beds, electrical cords, etc?: Yes Adequate lighting in your home to reduce risk of falls?: Yes Life alert?: No Use of a cane, walker or w/c?: No Grab bars in the bathroom?: Yes Shower chair or bench in shower?: Yes Elevated toilet seat or a handicapped toilet?: No  TIMED UP AND GO:  Was the test performed?  No  Cognitive Function:        08/31/2023    9:09 AM 08/21/2022    9:56 AM  6CIT Screen  What Year? 0 points 0 points  What month? 0 points 0 points  What time? 0 points 0 points  Count back from 20 0 points 0 points  Months in reverse 0 points 0  points  Repeat phrase 0 points 0 points  Total Score 0 points 0 points    Immunizations Immunization History  Administered Date(s) Administered   Fluad Quad(high Dose 65+) 05/26/2019, 06/01/2020   Influenza Split 06/01/2021   Influenza, High Dose Seasonal PF 06/07/2018, 06/24/2022, 06/02/2023   Influenza,inj,Quad PF,6+ Mos 06/05/2017   Influenza-Unspecified 06/02/2011, 06/01/2013, 06/01/2014, 06/02/2015, 06/01/2016   Moderna Covid-19 Fall Seasonal Vaccine 60yrs & older 06/12/2023   PFIZER Comirnaty(Gray Top)Covid-19 Tri-Sucrose Vaccine 03/28/2021   PFIZER(Purple Top)SARS-COV-2 Vaccination 10/09/2019, 11/03/2019, 06/29/2020   PNEUMOCOCCAL CONJUGATE-20 10/19/2021   Pfizer Covid-19 Vaccine Bivalent Booster 73yrs & up 08/06/2021, 06/23/2022   Pneumococcal Conjugate-13 12/21/2014   Pneumococcal Polysaccharide-23 08/22/2013   Respiratory Syncytial Virus Vaccine,Recomb Aduvanted(Arexvy) 05/29/2022   Td 09/02/2003   Tetanus 01/03/2014   Zoster, Live 05/12/2012    TDAP status: Up to date  Flu Vaccine status: Up to date  Pneumococcal vaccine status: Up to date  Covid-19 vaccine status: Information provided on how to obtain vaccines.   Qualifies for Shingles Vaccine? Yes   Zostavax completed Yes   Shingrix Completed?: No.    Education has been provided regarding the importance of this vaccine. Patient has been advised to call insurance company to determine out of pocket expense if they have not yet received this vaccine. Advised may also receive vaccine at local pharmacy or Health Dept. Verbalized acceptance and understanding.  Screening Tests Health Maintenance  Topic Date Due   Zoster Vaccines- Shingrix (1 of 2) 12/07/1969   COVID-19 Vaccine (8 - 2024-25 season) 08/07/2023   Medicare Annual Wellness (AWV)  08/22/2023   Diabetic kidney evaluation - eGFR measurement  12/25/2023   Diabetic kidney evaluation - Urine ACR  12/25/2023   FOOT EXAM  12/25/2023   HEMOGLOBIN A1C   01/03/2024   DTaP/Tdap/Td (3 - Tdap) 01/04/2024   OPHTHALMOLOGY EXAM  01/20/2024   MAMMOGRAM  05/27/2024   Pneumonia Vaccine 50+ Years old  Completed   INFLUENZA VACCINE  Completed   DEXA SCAN  Completed   Hepatitis C Screening  Completed   HPV VACCINES  Aged Out   Colonoscopy  Discontinued    Health Maintenance  Health Maintenance Due  Topic Date Due   Zoster Vaccines- Shingrix (1 of 2) 12/07/1967   COVID-19 Vaccine (8 - 2024-25 season) 08/07/2023   Medicare Annual Wellness (AWV)  08/22/2023    Colorectal cancer screening: No longer required.   Mammogram status: Completed 05/2023. Repeat every year  Bone Density status: Completed 01/04/21. Results reflect: Bone density results: OSTEOPENIA. Repeat every 2 years.  Lung Cancer Screening: (Low Dose CT Chest recommended if Age 76-80 years, 20 pack-year currently smoking OR have quit w/in 15years.) does not qualify.   Additional Screening:  Hepatitis C Screening: does qualify; Completed 08/14/16  Vision Screening: Recommended annual ophthalmology exams for early detection of glaucoma and other disorders of the eye. Is the patient up to date with their annual eye exam?  Yes  Who is the provider or what is the name of the office in which the patient attends annual eye exams? Dr. Nile Riggs If pt is not established with a provider, would they like  to be referred to a provider to establish care? Yes .   Dental Screening: Recommended annual dental exams for proper oral hygiene  Diabetic Foot Exam: Diabetic Foot Exam: Completed 12/25/22  Community Resource Referral / Chronic Care Management: CRR required this visit?  No   CCM required this visit?  No     Plan:     I have personally reviewed and noted the following in the patient's chart:   Medical and social history Use of alcohol, tobacco or illicit drugs  Current medications and supplements including opioid prescriptions. Patient is not currently taking opioid  prescriptions. Functional ability and status Nutritional status Physical activity Advanced directives List of other physicians Hospitalizations, surgeries, and ER visits in previous 12 months Vitals Screenings to include cognitive, depression, and falls Referrals and appointments  In addition, I have reviewed and discussed with patient certain preventive protocols, quality metrics, and best practice recommendations. A written personalized care plan for preventive services as well as general preventive health recommendations were provided to patient.     Donne Anon, CMA   08/31/2023   After Visit Summary: (MyChart) Due to this being a telephonic visit, the after visit summary with patients personalized plan was offered to patient via MyChart   Nurse Notes: None

## 2023-09-23 ENCOUNTER — Other Ambulatory Visit: Payer: Self-pay | Admitting: Internal Medicine

## 2023-09-23 DIAGNOSIS — I10 Essential (primary) hypertension: Secondary | ICD-10-CM

## 2023-10-01 DIAGNOSIS — B351 Tinea unguium: Secondary | ICD-10-CM | POA: Diagnosis not present

## 2023-10-01 DIAGNOSIS — E1151 Type 2 diabetes mellitus with diabetic peripheral angiopathy without gangrene: Secondary | ICD-10-CM | POA: Diagnosis not present

## 2023-10-01 DIAGNOSIS — I739 Peripheral vascular disease, unspecified: Secondary | ICD-10-CM | POA: Diagnosis not present

## 2023-10-06 ENCOUNTER — Other Ambulatory Visit: Payer: Self-pay | Admitting: Internal Medicine

## 2023-10-17 DIAGNOSIS — B351 Tinea unguium: Secondary | ICD-10-CM | POA: Diagnosis not present

## 2023-10-26 DIAGNOSIS — E1151 Type 2 diabetes mellitus with diabetic peripheral angiopathy without gangrene: Secondary | ICD-10-CM | POA: Diagnosis not present

## 2023-10-26 DIAGNOSIS — M792 Neuralgia and neuritis, unspecified: Secondary | ICD-10-CM | POA: Diagnosis not present

## 2023-11-18 ENCOUNTER — Encounter: Payer: Self-pay | Admitting: Internal Medicine

## 2023-11-18 ENCOUNTER — Ambulatory Visit (INDEPENDENT_AMBULATORY_CARE_PROVIDER_SITE_OTHER): Payer: Medicare Other | Admitting: Internal Medicine

## 2023-11-18 VITALS — BP 120/60 | HR 62 | Temp 97.8°F | Resp 18 | Ht 66.0 in | Wt 143.2 lb

## 2023-11-18 DIAGNOSIS — I10 Essential (primary) hypertension: Secondary | ICD-10-CM | POA: Diagnosis not present

## 2023-11-18 DIAGNOSIS — I350 Nonrheumatic aortic (valve) stenosis: Secondary | ICD-10-CM | POA: Diagnosis not present

## 2023-11-18 DIAGNOSIS — Z79899 Other long term (current) drug therapy: Secondary | ICD-10-CM | POA: Diagnosis not present

## 2023-11-18 DIAGNOSIS — R0989 Other specified symptoms and signs involving the circulatory and respiratory systems: Secondary | ICD-10-CM

## 2023-11-18 DIAGNOSIS — E785 Hyperlipidemia, unspecified: Secondary | ICD-10-CM | POA: Diagnosis not present

## 2023-11-18 DIAGNOSIS — Z0000 Encounter for general adult medical examination without abnormal findings: Secondary | ICD-10-CM

## 2023-11-18 DIAGNOSIS — E118 Type 2 diabetes mellitus with unspecified complications: Secondary | ICD-10-CM | POA: Diagnosis not present

## 2023-11-18 DIAGNOSIS — Z0001 Encounter for general adult medical examination with abnormal findings: Secondary | ICD-10-CM

## 2023-11-18 DIAGNOSIS — I739 Peripheral vascular disease, unspecified: Secondary | ICD-10-CM

## 2023-11-18 LAB — LIPID PANEL
Cholesterol: 169 mg/dL (ref 0–200)
HDL: 52 mg/dL (ref >39.00)
LDL Cholesterol: 90 mg/dL (ref 0–99)
NonHDL: 116.52
Total CHOL/HDL Ratio: 3
Triglycerides: 134 mg/dL (ref 0.0–149.0)
VLDL: 26.8 mg/dL (ref 0.0–40.0)

## 2023-11-18 LAB — CBC WITH DIFFERENTIAL/PLATELET
Basophils Absolute: 0 10*3/uL (ref 0.0–0.1)
Basophils Relative: 0.6 % (ref 0.0–3.0)
Eosinophils Absolute: 0.2 10*3/uL (ref 0.0–0.7)
Eosinophils Relative: 2.4 % (ref 0.0–5.0)
HCT: 51.1 % — ABNORMAL HIGH (ref 36.0–46.0)
Hemoglobin: 16.7 g/dL — ABNORMAL HIGH (ref 12.0–15.0)
Lymphocytes Relative: 28.6 % (ref 12.0–46.0)
Lymphs Abs: 1.9 10*3/uL (ref 0.7–4.0)
MCHC: 32.7 g/dL (ref 30.0–36.0)
MCV: 97 fl (ref 78.0–100.0)
Monocytes Absolute: 0.3 10*3/uL (ref 0.1–1.0)
Monocytes Relative: 5.2 % (ref 3.0–12.0)
Neutro Abs: 4.2 10*3/uL (ref 1.4–7.7)
Neutrophils Relative %: 63.2 % (ref 43.0–77.0)
Platelets: 272 10*3/uL (ref 150.0–400.0)
RBC: 5.27 Mil/uL — ABNORMAL HIGH (ref 3.87–5.11)
RDW: 14.3 % (ref 11.5–15.5)
WBC: 6.7 10*3/uL (ref 4.0–10.5)

## 2023-11-18 LAB — BASIC METABOLIC PANEL
BUN: 20 mg/dL (ref 6–23)
CO2: 29 meq/L (ref 19–32)
Calcium: 10.5 mg/dL (ref 8.4–10.5)
Chloride: 102 meq/L (ref 96–112)
Creatinine, Ser: 0.8 mg/dL (ref 0.40–1.20)
GFR: 72.32 mL/min (ref >60.00)
Glucose, Bld: 134 mg/dL — ABNORMAL HIGH (ref 70–99)
Potassium: 4.8 meq/L (ref 3.5–5.1)
Sodium: 139 meq/L (ref 135–145)

## 2023-11-18 LAB — MICROALBUMIN / CREATININE URINE RATIO
Creatinine,U: 94.3 mg/dL
Microalb Creat Ratio: 59.1 mg/g — ABNORMAL HIGH (ref 0.0–30.0)
Microalb, Ur: 5.6 mg/dL — ABNORMAL HIGH (ref 0.0–1.9)

## 2023-11-18 LAB — AST: AST: 14 U/L (ref 0–37)

## 2023-11-18 LAB — ALT: ALT: 17 U/L (ref 0–35)

## 2023-11-18 NOTE — Patient Instructions (Addendum)
 Vaccines to consider: Tdap, Shingrix   Will arrange for echocardiogram and a test on your lower extremities  Check the  blood pressure regularly Blood pressure goal:  between 110/65 and  135/85. If it is consistently higher or lower, let me know     GO TO THE LAB : Get the blood work     Please go to the front desk: Arrange for a follow-up in 4 to 5 months

## 2023-11-18 NOTE — Assessment & Plan Note (Signed)
 Here for CPX  Other issues addressed: HTN.  BP looks good, recommend to check from time to time, continue amlodipine.  Check labs. DM: Per Endo, check micro.  Feet exam -  no neuropathy. Hyperlipidemia: On atorvastatin, check FLP AST ALT Aortic stenosis: No apparent symptoms, recheck echo. PVD?  Decreased pedal pulses, check ABIs. Anxiety: On Xanax, check UDS. RTC 4 to 5 months

## 2023-11-18 NOTE — Progress Notes (Signed)
 Subjective:    Patient ID: Nicole Duran, female    DOB: 21-Oct-1950, 73 y.o.   MRN: [id number]  DOS:  11/18/2023 Type of visit - description: cpx  Here for CPX Chronic medical problems addressed. In general feels well. Went to a podiatrist, was told the right leg circulation was decreased.  She denies claudication. Has a heart murmur, denies chest pain, shortness of breath or syncope. Also denies lower extremity paresthesias.  Review of Systems  Other than above, a 14 point review of systems is negative       Past Medical History:  Diagnosis Date   ALLERGIC RHINITIS 04/22/2007   Allergy    Anxiety    AODM 08/14/2008   Arthritis    BCC (basal cell carcinoma of skin)    Heart murmur    HYPERLIPIDEMIA 04/22/2007   Insomnia    Kidney stone    Menopause    Metabolic syndrome     Past Surgical History:  Procedure Laterality Date   cataracts Bilateral 06/15 11/15   CHOLECYSTECTOMY     Social History   Socioeconomic History   Marital status: Widowed    Spouse name: Not on file   Number of children: 1   Years of education: Not on file   Highest education level: Associate degree: occupational, Scientist, product/process development, or vocational program  Occupational History   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[place], Nutritional therapist: [employer]  Tobacco Use   Smoking status: Former   Smokeless tobacco: Never   Tobacco comments:    never heavy use , quit in the 80s  Substance and Sexual Activity   Alcohol use: Yes    Comment: 1 glass every few months   Drug use: No   Sexual activity: Not Currently  Other Topics Concern   Not on file  Social History Narrative   Lives by herself, widow (1990), 1 daughter Child psychotherapist ([place])  , 1 Barnetta Hammersmith    Lost mother 2014         Social Drivers of Corporate investment banker Strain: Low Risk  (08/30/2023)   Overall Financial Resource Strain (CARDIA)    Difficulty of Paying Living Expenses: Not hard at all  Food  Insecurity: No Food Insecurity (08/30/2023)   Hunger Vital Sign    Worried About Running Out of Food in the Last Year: Never true    Ran Out of Food in the Last Year: Never true  Transportation Needs: No Transportation Needs (08/30/2023)   PRAPARE - Administrator, Civil Service (Medical): No    Lack of Transportation (Non-Medical): No  Physical Activity: Sufficiently Active (08/30/2023)   Exercise Vital Sign    Days of Exercise per Week: 3 days    Minutes of Exercise per Session: 50 min  Stress: No Stress Concern Present (08/30/2023)   [employer] of Occupational Health - Occupational Stress Questionnaire    Feeling of Stress : Not at all  Social Connections: Unknown (08/30/2023)   Social Connection and Isolation Panel [NHANES]    Frequency of Communication with Friends and Family: More than three times a week    Frequency of Social Gatherings with Friends and Family: Once a week    Attends Religious Services: Patient declined    Database administrator or Organizations: Yes    Attends Banker Meetings: More than 4 times per year    Marital Status: Widowed  Intimate Partner Violence: Not At Risk (08/31/2023)  Humiliation, Afraid, Rape, and Kick questionnaire    Fear of Current or Ex-Partner: No    Emotionally Abused: No    Physically Abused: No    Sexually Abused: No     Current Outpatient Medications  Medication Instructions   ALPRAZolam (XANAX) 0.5 MG tablet TAKE 1 TABLET BY MOUTH 4 TIMES A DAY AS NEEDED FOR ANXIETY   amLODipine (NORVASC) 5 mg, Oral, Daily   atorvastatin (LIPITOR) 40 mg, Oral, Daily at bedtime   Azelastine HCl 137 MCG/SPRAY SOLN 2 sprays, Each Nare, 2 times daily   Blood Glucose Monitoring Suppl (ONE TOUCH ULTRA 2) W/DEVICE KIT Use once daily to check blood sugar   Cetirizine HCl (ZYRTEC PO) 1 tablet, Daily PRN   DiphenhydrAMINE HCl (BENADRYL PO) As needed   ibuprofen (ADVIL) 200 mg, Every 6 hours PRN   Jardiance 25 mg, Every  morning   Lancets (ONETOUCH DELICA PLUS LANCET33G) MISC USE TO CHECK BLOOD SUGARS ONCE DAILY   Multiple Vitamin (MULTIVITAMIN WITH MINERALS) TABS tablet 1 tablet, Daily   ONE TOUCH ULTRA TEST test strip USE 1 STRIP DAILY AS DIRECTED.   SitaGLIPtin-MetFORMIN HCl (JANUMET XR) [phone] MG TB24 1 tablet, Every evening   Triamcinolone Acetonide (NASACORT AQ NA) Place into the nose. Daily   Vitamin D (Cholecalciferol) 400 mg, Daily       Objective:   Physical Exam BP 120/60 (BP Location: Right Arm, Patient Position: Sitting, Cuff Size: Normal)   Pulse 62   Temp 97.8 F (36.6 C) (Oral)   Resp 18   Ht 5\' 6"  (1.676 m)   Wt 143 lb 3.2 oz (65 kg)   SpO2 99%   BMI 23.11 kg/m  General: Well developed, NAD, BMI noted Neck: No  thyromegaly  HEENT:  Normocephalic . Face symmetric, atraumatic Lungs:  CTA B Normal respiratory effort, no intercostal retractions, no accessory muscle use. Heart: RRR, + systolic murmur.  Abdomen:  Not distended, soft, non-tender. No rebound or rigidity.   DM foot exam: No edema Pedal pulses decreased bilaterally. Normal pinprick examination Skin: Exposed areas without rash. Not pale. Not jaundice Neurologic:  alert & oriented X3.  Speech normal, gait appropriate for age and unassisted Strength symmetric and appropriate for age.  Psych: Cognition and judgment appear intact.  Cooperative with normal attention span and concentration.  Behavior appropriate. No anxious or depressed appearing.     Assessment     Assessment  HTN: DX 09-2019 DM  Dx 2009--per endo Dr Talmage Nap Hyperlipidemia Anxiety, insomnia-- xanax per pcp CV: --Ao stenosis mild/mod grade 2 D disfx ECHO 10/2013. Menopausal BCC sees derm  H/o Kidney stones  PLAN:  Here for CPX -Td 2015 - s/p RSV   - zostavax 2013  - PNM 23:  2014; prevnar 12-2014; PNM 20: 2023 - had a flu shot anc covid vax 06/2024 -Vaccines I recommend: Tdap, Shingrix  - CCS: See previous entries, patient not  interested. - female care PAP 2019,  plans to see gyn 2025. MMG 05-2023 per pt  DEXA: per gyn T score -1.6 (May 2022) -Encouraged healthy diet and active  -has a H-POA , plans to bring it  Other issues addressed: HTN.  BP looks good, recommend to check from time to time, continue amlodipine.  Check labs. DM: Per Endo, check micro.  Feet exam -  no neuropathy. Hyperlipidemia: On atorvastatin, check FLP AST ALT Aortic stenosis: No apparent symptoms, recheck echo. PVD?  Decreased pedal pulses, check ABIs. Anxiety: On Xanax, check UDS. RTC 4 to  5 months

## 2023-11-18 NOTE — Assessment & Plan Note (Signed)
 Here for CPX -Td 2015 - s/p RSV   - zostavax 2013  - PNM 23:  2014; prevnar 12-2014; PNM 20: 2023 - had a flu shot anc covid vax 06/2024 -Vaccines I recommend: Tdap, Shingrix  - CCS: See previous entries, patient not interested. - female care PAP 2019,  plans to see gyn 2025. MMG 05-2023 per pt  DEXA: per gyn T score -1.6 (May 2022) -Encouraged healthy diet and active  -has a H-POA , plans to bring it

## 2023-11-19 LAB — URINE DRUGS OF ABUSE SCREEN W ALC, ROUTINE (REF LAB)
Amphetamines, Urine: NEGATIVE ng/mL
Barbiturate Quant, Ur: NEGATIVE ng/mL
Benzodiazepine Quant, Ur: NEGATIVE ng/mL
Cannabinoid Quant, Ur: NEGATIVE ng/mL
Cocaine (Metab.): NEGATIVE ng/mL
Ethanol, Urine: NEGATIVE %
Methadone Screen, Urine: NEGATIVE ng/mL
Opiate Quant, Ur: NEGATIVE ng/mL
PCP Quant, Ur: NEGATIVE ng/mL
Propoxyphene: NEGATIVE ng/mL

## 2023-11-20 ENCOUNTER — Telehealth: Payer: Self-pay

## 2023-11-20 MED ORDER — EZETIMIBE 10 MG PO TABS: 10.0000 mg | ORAL_TABLET | Freq: Every day | ORAL | 3 refills | Status: DC

## 2023-11-20 NOTE — Telephone Encounter (Signed)
 Please advise? I don't see where you have resulted labs yet. Maybe a letter from her insurance?

## 2023-11-20 NOTE — Telephone Encounter (Signed)
Spoke w/ Pt- informed of results and recommendations. Pt verbalized understanding. Rx sent.

## 2023-11-20 NOTE — Telephone Encounter (Signed)
 Call patient. I review the labs from a couple of days ago. Her cholesterol needs to be a little lower. Recommend to continue atorvastatin 40 mg and add Zetia 10 mg 1 tablet daily, send a year supply. Will recheck cholesterol when she comes back. Other results are satisfactory.

## 2023-11-20 NOTE — Telephone Encounter (Signed)
 Copied from CRM [id number]. Topic: General - Other >> Nov 20, 2023  8:41 AM Alcus Dad wrote: Reason for CRM: Patient stated she received an email about atorvastatin (LIPITOR) 40 MG tablet. The message was referring to changing her dosage to 40 mg but patient is already taking 40 mg of this medication. Please call patient with more details about this matter

## 2023-11-24 DIAGNOSIS — E1165 Type 2 diabetes mellitus with hyperglycemia: Secondary | ICD-10-CM | POA: Diagnosis not present

## 2023-11-27 ENCOUNTER — Telehealth: Payer: Self-pay | Admitting: Internal Medicine

## 2023-11-27 NOTE — Telephone Encounter (Signed)
 PDMP okay, Rx sent

## 2023-11-27 NOTE — Telephone Encounter (Signed)
 Requesting: alprazolam 0.5mg   Contract: 08/04/22 UDS: 11/18/23 Last Visit: 11/18/23 Next Visit: 04/25/24 Last Refill: 01/05/23 #120 and 1RF   Please Advise

## 2023-12-01 DIAGNOSIS — E1165 Type 2 diabetes mellitus with hyperglycemia: Secondary | ICD-10-CM | POA: Diagnosis not present

## 2023-12-01 DIAGNOSIS — I10 Essential (primary) hypertension: Secondary | ICD-10-CM | POA: Diagnosis not present

## 2023-12-01 DIAGNOSIS — E7800 Pure hypercholesterolemia, unspecified: Secondary | ICD-10-CM | POA: Diagnosis not present

## 2023-12-01 DIAGNOSIS — R809 Proteinuria, unspecified: Secondary | ICD-10-CM | POA: Diagnosis not present

## 2023-12-14 DIAGNOSIS — E1151 Type 2 diabetes mellitus with diabetic peripheral angiopathy without gangrene: Secondary | ICD-10-CM | POA: Diagnosis not present

## 2023-12-14 DIAGNOSIS — M792 Neuralgia and neuritis, unspecified: Secondary | ICD-10-CM | POA: Diagnosis not present

## 2023-12-31 ENCOUNTER — Ambulatory Visit (HOSPITAL_COMMUNITY): Admission: RE | Admit: 2023-12-31 | Source: Ambulatory Visit

## 2023-12-31 ENCOUNTER — Ambulatory Visit (HOSPITAL_COMMUNITY)

## 2024-01-18 ENCOUNTER — Other Ambulatory Visit: Payer: Self-pay | Admitting: Internal Medicine

## 2024-01-22 ENCOUNTER — Encounter (HOSPITAL_COMMUNITY)

## 2024-01-22 ENCOUNTER — Other Ambulatory Visit (HOSPITAL_COMMUNITY)

## 2024-01-22 ENCOUNTER — Encounter (HOSPITAL_COMMUNITY): Payer: Self-pay

## 2024-01-23 ENCOUNTER — Other Ambulatory Visit: Payer: Self-pay | Admitting: Internal Medicine

## 2024-02-08 DIAGNOSIS — H35372 Puckering of macula, left eye: Secondary | ICD-10-CM | POA: Diagnosis not present

## 2024-02-08 DIAGNOSIS — E113293 Type 2 diabetes mellitus with mild nonproliferative diabetic retinopathy without macular edema, bilateral: Secondary | ICD-10-CM | POA: Diagnosis not present

## 2024-02-08 DIAGNOSIS — H40003 Preglaucoma, unspecified, bilateral: Secondary | ICD-10-CM | POA: Diagnosis not present

## 2024-02-08 DIAGNOSIS — H524 Presbyopia: Secondary | ICD-10-CM | POA: Diagnosis not present

## 2024-02-08 DIAGNOSIS — H5203 Hypermetropia, bilateral: Secondary | ICD-10-CM | POA: Diagnosis not present

## 2024-02-08 DIAGNOSIS — H52203 Unspecified astigmatism, bilateral: Secondary | ICD-10-CM | POA: Diagnosis not present

## 2024-02-08 DIAGNOSIS — H25043 Posterior subcapsular polar age-related cataract, bilateral: Secondary | ICD-10-CM | POA: Diagnosis not present

## 2024-02-08 LAB — HM DIABETES EYE EXAM

## 2024-02-26 ENCOUNTER — Ambulatory Visit (HOSPITAL_COMMUNITY): Admission: RE | Admit: 2024-02-26 | Source: Ambulatory Visit

## 2024-02-26 ENCOUNTER — Other Ambulatory Visit (HOSPITAL_COMMUNITY)

## 2024-02-26 ENCOUNTER — Inpatient Hospital Stay (HOSPITAL_COMMUNITY): Admission: RE | Admit: 2024-02-26 | Source: Ambulatory Visit

## 2024-03-01 DIAGNOSIS — M792 Neuralgia and neuritis, unspecified: Secondary | ICD-10-CM | POA: Diagnosis not present

## 2024-03-01 DIAGNOSIS — E1151 Type 2 diabetes mellitus with diabetic peripheral angiopathy without gangrene: Secondary | ICD-10-CM | POA: Diagnosis not present

## 2024-03-14 DIAGNOSIS — E1151 Type 2 diabetes mellitus with diabetic peripheral angiopathy without gangrene: Secondary | ICD-10-CM | POA: Diagnosis not present

## 2024-03-14 DIAGNOSIS — M792 Neuralgia and neuritis, unspecified: Secondary | ICD-10-CM | POA: Diagnosis not present

## 2024-03-18 ENCOUNTER — Encounter: Payer: Self-pay | Admitting: Advanced Practice Midwife

## 2024-03-21 ENCOUNTER — Ambulatory Visit: Admitting: Internal Medicine

## 2024-03-23 ENCOUNTER — Other Ambulatory Visit: Payer: Self-pay | Admitting: Internal Medicine

## 2024-03-23 DIAGNOSIS — I10 Essential (primary) hypertension: Secondary | ICD-10-CM

## 2024-04-19 ENCOUNTER — Ambulatory Visit (HOSPITAL_COMMUNITY)
Admission: RE | Admit: 2024-04-19 | Discharge: 2024-04-19 | Disposition: A | Discharge status: Home / Self Care | Source: Ambulatory Visit | Attending: Internal Medicine | Admitting: Internal Medicine

## 2024-04-19 ENCOUNTER — Ambulatory Visit (HOSPITAL_BASED_OUTPATIENT_CLINIC_OR_DEPARTMENT_OTHER)
Admission: RE | Admit: 2024-04-19 | Discharge: 2024-04-19 | Disposition: A | Discharge status: Home / Self Care | Source: Ambulatory Visit | Attending: Internal Medicine | Admitting: Internal Medicine

## 2024-04-19 DIAGNOSIS — I739 Peripheral vascular disease, unspecified: Secondary | ICD-10-CM | POA: Insufficient documentation

## 2024-04-19 DIAGNOSIS — R0989 Other specified symptoms and signs involving the circulatory and respiratory systems: Secondary | ICD-10-CM | POA: Insufficient documentation

## 2024-04-19 DIAGNOSIS — I350 Nonrheumatic aortic (valve) stenosis: Secondary | ICD-10-CM

## 2024-04-19 LAB — ECHOCARDIOGRAM COMPLETE
AR max vel: 0.8 cm2
AV Area VTI: 0.81 cm2
AV Area mean vel: 0.77 cm2
AV Mean grad: 22 mmHg
AV Peak grad: 33.6 mmHg
AV Vena cont: 0.27 cm
Ao pk vel: 2.9 m/s
MV VTI: 1.23 cm2
S' Lateral: 2.57 cm

## 2024-04-20 ENCOUNTER — Ambulatory Visit: Payer: Self-pay | Admitting: Internal Medicine

## 2024-04-20 ENCOUNTER — Telehealth: Payer: Self-pay | Admitting: Internal Medicine

## 2024-04-20 LAB — VAS US ABI WITH/WO TBI
Left ABI: 0.48
Right ABI: 0.51

## 2024-04-20 NOTE — Telephone Encounter (Signed)
 Copied from CRM #[id number]. Topic: General - Other >> Apr 20, 2024  9:15 AM [name] wrote: Reason for CRM: Patient would like Nicole Duran to give her a call back [phone]

## 2024-04-20 NOTE — Telephone Encounter (Signed)
 Spoke w/ Pt-she wanted me to relay results to her again. Informed of results, states she will discuss further w/ PCP on 04/25/24.

## 2024-04-20 NOTE — Telephone Encounter (Signed)
 Spoke w/ Pt- informed of results and recommendations. Pt states she won't see cardiology, her father had a heart surgery and was never the same and I'm just not going to deal with it. Informed I'd make PCP aware.

## 2024-04-20 NOTE — Telephone Encounter (Signed)
 ABIs: R moderate disease.  L: Severe disease (asx when seen few months ago   Moderate to severe aortic stenosis.  Aortic valve 0.81 cm. (Previous valve area 1.16 cm.)  Please call pt: Aorta disease slt worse,has some problems w/ circulation (legs)  arrange a non urgent cardiology referral. Dx Aortic stenosis

## 2024-04-20 NOTE — Telephone Encounter (Signed)
 Noted, will discuss results in person at the next opportunity

## 2024-04-25 ENCOUNTER — Encounter: Payer: Self-pay | Admitting: Internal Medicine

## 2024-04-25 ENCOUNTER — Ambulatory Visit: Admitting: Internal Medicine

## 2024-04-25 VITALS — BP 138/62 | HR 62 | Temp 98.1°F | Resp 16 | Ht 66.0 in | Wt 145.2 lb

## 2024-04-25 DIAGNOSIS — I739 Peripheral vascular disease, unspecified: Secondary | ICD-10-CM | POA: Diagnosis not present

## 2024-04-25 DIAGNOSIS — Z5320 Procedure and treatment not carried out because of patient's decision for unspecified reasons: Secondary | ICD-10-CM | POA: Diagnosis not present

## 2024-04-25 DIAGNOSIS — I350 Nonrheumatic aortic (valve) stenosis: Secondary | ICD-10-CM | POA: Diagnosis not present

## 2024-04-25 DIAGNOSIS — D751 Secondary polycythemia: Secondary | ICD-10-CM | POA: Diagnosis not present

## 2024-04-25 DIAGNOSIS — I10 Essential (primary) hypertension: Secondary | ICD-10-CM

## 2024-04-25 DIAGNOSIS — E1165 Type 2 diabetes mellitus with hyperglycemia: Secondary | ICD-10-CM | POA: Diagnosis not present

## 2024-04-25 DIAGNOSIS — R399 Unspecified symptoms and signs involving the genitourinary system: Secondary | ICD-10-CM | POA: Diagnosis not present

## 2024-04-25 LAB — URINALYSIS, ROUTINE W REFLEX MICROSCOPIC
Bilirubin Urine: NEGATIVE
Ketones, ur: NEGATIVE
Leukocytes,Ua: NEGATIVE
Nitrite: NEGATIVE
Specific Gravity, Urine: 1.02 (ref 1.000–1.030)
Total Protein, Urine: NEGATIVE
Urine Glucose: >=1000 — AB
Urobilinogen, UA: 0.2 (ref 0.0–1.0)
pH: 6 (ref 5.0–8.0)

## 2024-04-25 LAB — BASIC METABOLIC PANEL WITH GFR
BUN: 15 mg/dL (ref 6–23)
CO2: 28 meq/L (ref 19–32)
Calcium: 10.2 mg/dL (ref 8.4–10.5)
Chloride: 103 meq/L (ref 96–112)
Creatinine, Ser: 0.74 mg/dL (ref 0.40–1.20)
GFR: 79.16 mL/min (ref >60.00)
Glucose, Bld: 145 mg/dL — ABNORMAL HIGH (ref 70–99)
Potassium: 4.2 meq/L (ref 3.5–5.1)
Sodium: 142 meq/L (ref 135–145)

## 2024-04-25 LAB — MICROALBUMIN / CREATININE URINE RATIO
Creatinine,U: 92 mg/dL
Microalb Creat Ratio: 30.6 mg/g — ABNORMAL HIGH (ref 0.0–30.0)
Microalb, Ur: 2.8 mg/dL — ABNORMAL HIGH (ref 0.0–1.9)

## 2024-04-25 NOTE — Patient Instructions (Addendum)
 Vaccines I recommend this fall: Flu shot COVID booster  If you decide to see a cardiologist let us  know. Will refer you to the [facility]  Cardiology group.  Check the  blood pressure regularly Blood pressure goal:  between 110/65 and  135/85. If it is consistently higher or lower, let me know     GO TO THE LAB :  Get the blood work   Your results will be posted on MyChart with my comments  Go to the front desk for the checkout Please make an appointment for a checkup in 4 months    STOP BY THE FIRST FLOOR:  get the XR

## 2024-04-25 NOTE — Progress Notes (Unsigned)
 Subjective:    Patient ID: Nicole Duran, female    DOB: [birth year], 73 y.o.   MRN: [id number]  DOS:  04/25/2024 Type of visit - description: follow up  Chronic medical problems addressed. She is concerned about recent echocardiogram and ABI results.  Denies chest pain or difficulty breathing. No palpitations. No claudications.  Also, having some urinary frequency without dysuria or gross hematuria.  Request to check her urine.  Stopped zetia    Review of Systems See above   Past Medical History:  Diagnosis Date   ALLERGIC RHINITIS 04/22/2007   Allergy    Anxiety    AODM 08/14/2008   Arthritis    BCC (basal cell carcinoma of skin)    Heart murmur    HYPERLIPIDEMIA 04/22/2007   Insomnia    Kidney stone    Menopause    Metabolic syndrome     Past Surgical History:  Procedure Laterality Date   cataracts Bilateral 06/15 11/15   CHOLECYSTECTOMY      Current Outpatient Medications  Medication Instructions   ALPRAZolam  (XANAX ) 0.5 MG tablet TAKE 1 TABLET BY MOUTH 4 TIMES A DAY AS NEEDED FOR ANXIETY   amLODipine  (NORVASC ) 5 mg, Oral, Daily   atorvastatin  (LIPITOR) 40 mg, Oral, Daily at bedtime   Azelastine  HCl 137 MCG/SPRAY SOLN 2 sprays, Each Nare, 2 times daily   Blood Glucose Monitoring Suppl (ONE TOUCH ULTRA 2) W/DEVICE KIT Use once daily to check blood sugar   Cetirizine HCl (ZYRTEC PO) 1 tablet, Daily PRN   DiphenhydrAMINE HCl (BENADRYL PO) As needed   ezetimibe  (ZETIA ) 10 mg, Oral, Daily   ibuprofen (ADVIL) 200 mg, Every 6 hours PRN   Jardiance 25 mg, Every morning   Lancets (ONETOUCH DELICA PLUS LANCET33G) MISC USE TO CHECK BLOOD SUGARS ONCE DAILY   Multiple Vitamin (MULTIVITAMIN WITH MINERALS) TABS tablet 1 tablet, Daily   ONE TOUCH ULTRA TEST test strip USE 1 STRIP DAILY AS DIRECTED.   SitaGLIPtin -MetFORMIN  HCl (JANUMET  XR) [phone] MG TB24 1 tablet, Every evening   Triamcinolone Acetonide (NASACORT AQ NA) Place into the nose. Daily   Vitamin D   (Cholecalciferol) 400 mg, Daily       Objective:   Physical Exam BP (!) 142/82   Pulse 62   Temp 98.1 F (36.7 C) (Oral)   Resp 16   Ht 5' 6 (1.676 m)   Wt 145 lb 4 oz (65.9 kg)   SpO2 96%   BMI 23.44 kg/m  General:   Well developed, NAD, BMI noted. HEENT:  Normocephalic . Face symmetric, atraumatic Lungs:  CTA B Normal respiratory effort, no intercostal retractions, no accessory muscle use. Heart: RRR, + systolic murmur.  Lower extremities: no pretibial edema bilaterally  Skin: Not pale. Not jaundice Neurologic:  alert & oriented X3.  Speech normal, gait appropriate for age and unassisted Psych--  Cognition and judgment appear intact.  Cooperative with normal attention span and concentration.  Behavior appropriate. No anxious or depressed appearing.      Assessment     Assessment  HTN: DX 09-2019 DM  Dx 2009--per endo Dr Tommas Hyperlipidemia Anxiety, insomnia-- xanax  per pcp CV: --Ao stenosis  ECHO 10/2013.  Echo 04-2024: Moderate disease. PVD: Per ABIs-2025 Menopausal BCC sees derm  H/o Kidney stones  PLAN:  UA urine culture with micro BMP CBC HTN: Ambulatory BPs very good, in the 120/65 range.  Continue amlodipine . DM: Per Endo, check a BMP and micro. Hyperlipidemia: On atorvastatin  40, last LDL 90, Zetia  was added,  she stopped because some nausea.  Advised that her LDL goal is much lower, below 70 at least.  She is willing to try again Zetia . Hopefully she will tolerate LUTS: See HPI, check UA urine culture Polycythemia: Noted increased hemoglobin over time, Epworth scale negative for sleep apnea.  Check CBC today. Aortic stenosis, PVD - Moderate per last recent echocardiogram.  Aortic valve 0.81 cm.  Referred to cardiology. -PVD: Had decreased pulses noted at the last visit, lower extremities ABIs show R moderate disease, L severe disease Based on the above, was referred to cardiology, declined to go, I explained the best I could what means aortic  stenosis and PVD.  She is completely asymptomatic, we agreed that she will let me know when/if she is ready to be referred.  To be on the look out for vascular symptoms including claudication. Vaccine advice provided RTC 4 months   3- Here for CPX -Td 2015 - s/p RSV   - zostavax 2013  - PNM 23:  2014; prevnar 12-2014; PNM 20: 2023 - had a flu shot anc covid vax 06/2024 -Vaccines I recommend: Tdap, Shingrix  - CCS: See previous entries, patient not interested. - female care PAP 2019,  plans to see gyn 2025. MMG 05-2023 per pt  DEXA: per gyn T score -1.6 (May 2022) -Encouraged healthy diet and active  -has a H-POA , plans to bring it  Other issues addressed: HTN.  BP looks good, recommend to check from time to time, continue amlodipine .  Check labs. DM: Per Endo, check micro.  Feet exam -  no neuropathy. Hyperlipidemia: On atorvastatin , check FLP AST ALT Aortic stenosis: No apparent symptoms, recheck echo. PVD?  Decreased pedal pulses, check ABIs. Anxiety: On Xanax , check UDS. RTC 4 to 5 months

## 2024-04-26 LAB — CBC WITH DIFFERENTIAL/PLATELET
Basophils Absolute: 0.1 K/uL (ref 0.0–0.1)
Basophils Relative: 1 % (ref 0.0–3.0)
Eosinophils Absolute: 0.1 K/uL (ref 0.0–0.7)
Eosinophils Relative: 1.1 % (ref 0.0–5.0)
HCT: 44.9 % (ref 36.0–46.0)
Hemoglobin: 14.7 g/dL (ref 12.0–15.0)
Lymphocytes Relative: 21.5 % (ref 12.0–46.0)
Lymphs Abs: 1.9 K/uL (ref 0.7–4.0)
MCHC: 32.9 g/dL (ref 30.0–36.0)
MCV: 94.7 fl (ref 78.0–100.0)
Monocytes Absolute: 0.3 K/uL (ref 0.1–1.0)
Monocytes Relative: 4 % (ref 3.0–12.0)
Neutro Abs: 6.2 K/uL (ref 1.4–7.7)
Neutrophils Relative %: 72.4 % (ref 43.0–77.0)
Platelets: 252 K/uL (ref 150.0–400.0)
RBC: 4.73 Mil/uL (ref 3.87–5.11)
RDW: 14.8 % (ref 11.5–15.5)
WBC: 8.6 K/uL (ref 4.0–10.5)

## 2024-04-26 LAB — URINE CULTURE
MICRO NUMBER:: 16877700
SPECIMEN QUALITY:: ADEQUATE

## 2024-04-26 NOTE — Assessment & Plan Note (Signed)
 HTN: Ambulatory BPs very good, in the 120/65 range.  Continue amlodipine . DM: Per Endo, check a BMP and micro. Hyperlipidemia: On atorvastatin  40, last LDL 90, Zetia  was added, she stopped because some nausea.  Advised that her LDL goal is much lower, below 70 at least.  She is willing to try again Zetia  and notify me if she can take it. LUTS: See HPI, check UA urine culture Polycythemia: Noted increased hemoglobin over time, Epworth scale negative for sleep apnea.  Check CBC today. Aortic stenosis, PVD - Moderate per last recent echocardiogram.  Aortic valve 0.81 cm.    -PVD: Had decreased pulses noted at the last visit, lower extremities ABIs show R moderate disease, L severe disease Based on the above, was referred to cardiology, declined to go, I explained the best I could what means aortic stenosis and PVD.  She is completely asymptomatic, we agreed that she will let me know when/if she is ready to be referred.  To be on the look out for vascular symptoms including claudication. Vaccine advice provided RTC 4 months

## 2024-04-28 ENCOUNTER — Ambulatory Visit: Payer: Self-pay | Admitting: Internal Medicine

## 2024-04-28 DIAGNOSIS — I10 Essential (primary) hypertension: Secondary | ICD-10-CM

## 2024-04-28 MED ORDER — LOSARTAN POTASSIUM 25 MG PO TABS: 25.0000 mg | ORAL_TABLET | Freq: Every day | ORAL | 0 refills | Status: DC

## 2024-04-28 NOTE — Telephone Encounter (Signed)
 Copied from CRM [id number]. Topic: Clinical - Medication Question >> Apr 28, 2024  1:56 PM Mia F wrote: Reason for CRM: Dr Amon recently prescibed losartan  (COZAAR ) 25 MG tablet for pt and she wants to know if it is a blood pressure medication as she is already taking bp meds or if this is for something else. Please advise >> Apr 28, 2024  3:42 PM Martinique E wrote: Patient still has a follow up question about this even though she has been messaging through MyChart, patient would like a phone call from Marcela Alatorre to further discuss.

## 2024-04-28 NOTE — Telephone Encounter (Signed)
 Spoke w/ Pt- informed of recommendations. Lab appt scheduled.

## 2024-05-09 ENCOUNTER — Other Ambulatory Visit (INDEPENDENT_AMBULATORY_CARE_PROVIDER_SITE_OTHER)

## 2024-05-09 DIAGNOSIS — I10 Essential (primary) hypertension: Secondary | ICD-10-CM

## 2024-05-10 LAB — BASIC METABOLIC PANEL WITH GFR
BUN: 18 mg/dL (ref 6–23)
CO2: 31 meq/L (ref 19–32)
Calcium: 10.9 mg/dL — ABNORMAL HIGH (ref 8.4–10.5)
Chloride: 100 meq/L (ref 96–112)
Creatinine, Ser: 0.75 mg/dL (ref 0.40–1.20)
GFR: 77.88 mL/min (ref >60.00)
Glucose, Bld: 118 mg/dL — ABNORMAL HIGH (ref 70–99)
Potassium: 4.4 meq/L (ref 3.5–5.1)
Sodium: 140 meq/L (ref 135–145)

## 2024-05-17 ENCOUNTER — Other Ambulatory Visit: Payer: Self-pay | Admitting: Internal Medicine

## 2024-05-17 ENCOUNTER — Ambulatory Visit: Payer: Self-pay | Admitting: Internal Medicine

## 2024-05-17 MED ORDER — LOSARTAN POTASSIUM 25 MG PO TABS: 25.0000 mg | ORAL_TABLET | Freq: Every day | ORAL | 1 refills | Status: DC

## 2024-06-09 DIAGNOSIS — R350 Frequency of micturition: Secondary | ICD-10-CM | POA: Diagnosis not present

## 2024-06-09 DIAGNOSIS — Z1231 Encounter for screening mammogram for malignant neoplasm of breast: Secondary | ICD-10-CM | POA: Diagnosis not present

## 2024-06-21 DIAGNOSIS — E7800 Pure hypercholesterolemia, unspecified: Secondary | ICD-10-CM | POA: Diagnosis not present

## 2024-06-21 DIAGNOSIS — E1165 Type 2 diabetes mellitus with hyperglycemia: Secondary | ICD-10-CM | POA: Diagnosis not present

## 2024-06-29 ENCOUNTER — Other Ambulatory Visit: Payer: Self-pay

## 2024-06-29 ENCOUNTER — Emergency Department (HOSPITAL_COMMUNITY)
Admission: EM | Admit: 2024-06-29 | Discharge: 2024-06-29 | Disposition: A | Discharge status: Other Acute Inpatient Hospital | Attending: Emergency Medicine | Admitting: Emergency Medicine

## 2024-06-29 ENCOUNTER — Encounter (HOSPITAL_COMMUNITY): Payer: Self-pay | Admitting: *Deleted

## 2024-06-29 ENCOUNTER — Emergency Department (HOSPITAL_COMMUNITY)

## 2024-06-29 DIAGNOSIS — R935 Abnormal findings on diagnostic imaging of other abdominal regions, including retroperitoneum: Secondary | ICD-10-CM | POA: Diagnosis not present

## 2024-06-29 DIAGNOSIS — I959 Hypotension, unspecified: Secondary | ICD-10-CM | POA: Diagnosis not present

## 2024-06-29 DIAGNOSIS — Y92009 Unspecified place in unspecified non-institutional (private) residence as the place of occurrence of the external cause: Secondary | ICD-10-CM | POA: Insufficient documentation

## 2024-06-29 DIAGNOSIS — S79912A Unspecified injury of left hip, initial encounter: Secondary | ICD-10-CM | POA: Diagnosis present

## 2024-06-29 DIAGNOSIS — S72142A Displaced intertrochanteric fracture of left femur, initial encounter for closed fracture: Secondary | ICD-10-CM | POA: Insufficient documentation

## 2024-06-29 DIAGNOSIS — R531 Weakness: Secondary | ICD-10-CM | POA: Diagnosis not present

## 2024-06-29 DIAGNOSIS — W010XXA Fall on same level from slipping, tripping and stumbling without subsequent striking against object, initial encounter: Secondary | ICD-10-CM | POA: Insufficient documentation

## 2024-06-29 DIAGNOSIS — W19XXXA Unspecified fall, initial encounter: Secondary | ICD-10-CM | POA: Diagnosis not present

## 2024-06-29 DIAGNOSIS — S72002A Fracture of unspecified part of neck of left femur, initial encounter for closed fracture: Secondary | ICD-10-CM

## 2024-06-29 DIAGNOSIS — Z7401 Bed confinement status: Secondary | ICD-10-CM | POA: Diagnosis not present

## 2024-06-29 LAB — CBC WITH DIFFERENTIAL/PLATELET
Abs Immature Granulocytes: 0.07 K/uL (ref 0.00–0.07)
Basophils Absolute: 0.1 K/uL (ref 0.0–0.1)
Basophils Relative: 0 %
Eosinophils Absolute: 0 K/uL (ref 0.0–0.5)
Eosinophils Relative: 0 %
HCT: 45.3 % (ref 36.0–46.0)
Hemoglobin: 14.1 g/dL (ref 12.0–15.0)
Immature Granulocytes: 1 %
Lymphocytes Relative: 14 %
Lymphs Abs: 1.6 K/uL (ref 0.7–4.0)
MCH: 29.8 pg (ref 26.0–34.0)
MCHC: 31.1 g/dL (ref 30.0–36.0)
MCV: 95.8 fL (ref 80.0–100.0)
Monocytes Absolute: 0.6 K/uL (ref 0.1–1.0)
Monocytes Relative: 6 %
Neutro Abs: 8.9 K/uL — ABNORMAL HIGH (ref 1.7–7.7)
Neutrophils Relative %: 79 %
Platelets: 296 K/uL (ref 150–400)
RBC: 4.73 MIL/uL (ref 3.87–5.11)
RDW: 15 % (ref 11.5–15.5)
WBC: 11.3 K/uL — ABNORMAL HIGH (ref 4.0–10.5)
nRBC: 0 % (ref 0.0–0.2)

## 2024-06-29 LAB — TYPE AND SCREEN
ABO/RH(D): AB POS
Antibody Screen: NEGATIVE

## 2024-06-29 LAB — BASIC METABOLIC PANEL WITH GFR
Anion gap: 14 (ref 5–15)
BUN: 13 mg/dL (ref 8–23)
CO2: 21 mmol/L — ABNORMAL LOW (ref 22–32)
Calcium: 10.5 mg/dL — ABNORMAL HIGH (ref 8.9–10.3)
Chloride: 105 mmol/L (ref 98–111)
Creatinine, Ser: 0.6 mg/dL (ref 0.44–1.00)
GFR, Estimated: >60 mL/min (ref >60)
Glucose, Bld: 197 mg/dL — ABNORMAL HIGH (ref 70–99)
Potassium: 4.1 mmol/L (ref 3.5–5.1)
Sodium: 140 mmol/L (ref 135–145)

## 2024-06-29 LAB — CBG MONITORING, ED: Glucose-Capillary: 153 mg/dL — ABNORMAL HIGH (ref 70–99)

## 2024-06-29 LAB — ABO/RH: ABO/RH(D): AB POS

## 2024-06-29 MED ORDER — HYDROMORPHONE HCL 1 MG/ML IJ SOLN
1.0000 mg | Freq: Once | INTRAMUSCULAR | Status: AC
  Administered 2024-06-29: 1 mg via INTRAVENOUS
  Filled 2024-06-29: qty 1

## 2024-06-29 MED ORDER — MORPHINE SULFATE (PF) 4 MG/ML IV SOLN
4.0000 mg | Freq: Once | INTRAVENOUS | Status: AC
  Administered 2024-06-29: 4 mg via INTRAVENOUS
  Filled 2024-06-29: qty 1

## 2024-06-29 MED ORDER — HYDROMORPHONE HCL 1 MG/ML IJ SOLN
0.5000 mg | Freq: Once | INTRAMUSCULAR | Status: AC
  Administered 2024-06-29: 0.5 mg via INTRAVENOUS
  Filled 2024-06-29: qty 1

## 2024-06-29 MED ORDER — ONDANSETRON HCL 4 MG/2ML IJ SOLN
4.0000 mg | Freq: Once | INTRAMUSCULAR | Status: AC
  Administered 2024-06-29: 4 mg via INTRAVENOUS
  Filled 2024-06-29: qty 2

## 2024-06-29 NOTE — ED Provider Notes (Signed)
 Care assumed from Dr. Laurice.  At time of transfer care, we are awaiting callback from [facility] health orthopedics team about transfer for hip fracture in the setting of lytic lesions on imaging of the pelvis.  Dr. Carline Lot with orthopedics was called by Dr. Laurice who recommended patient be transferred to [facility] for a higher level of care with orthopedic oncology as well.  7:35 PM Spoke to orthopedics at [facility] who agrees for ED to ED transfer to take care of her.  The excepting orthopedic physician is a Dr. Holly Pilson.  Patient will be transported to [facility] for further management of her hip fracture with pelvis abnormalities on imaging  11:23 PM Patient now being transferred to [facility] for further care.  Clinical Impression: 1. Fall, initial encounter   2. Closed fracture of left hip, initial encounter [facility])     Disposition: ED to ED transfer excepting orthopedist is Dr. Claris  This note was prepared with assistance of Dragon voice recognition software. Occasional wrong-word or sound-a-like substitutions may have occurred due to the inherent limitations of voice recognition software.     Trevia Nop, Lonni PARAS, MD 06/29/24 [phone]

## 2024-06-29 NOTE — ED Triage Notes (Signed)
 BIB EMS due to fall at home, Left leg shortening and rotation, No other injuries, Pt did not hit head. Trip and fall incident. 132/78-82-98% RA CBG 243

## 2024-06-29 NOTE — ED Provider Notes (Signed)
 [name] [facility] AT [facility] Provider Note   CSN: [id number] Arrival date & time: 06/29/24  1059     Patient presents with: [place]   Nicole Duran is a 73 y.o. female.  {Add pertinent medical, surgical, social history, OB history to HPI:7434} 73 year old female with prior medical history as detailed below presents for evaluation.  Patient was at home.  She was rolling her garbage can back from a curb.  She slipped and fell.  She landed hard on her left hip.  She complains of pain to the left hip.  She was unable to stand after the fall.  She denies prior trauma or surgery to the left hip.  She did not hit her head.  She did not have LOC.  The history is provided by the patient and medical records.       Prior to Admission medications   Medication Sig Start Date End Date Taking? Authorizing Provider  ALPRAZolam  (XANAX ) 0.5 MG tablet TAKE 1 TABLET BY MOUTH 4 TIMES A DAY AS NEEDED FOR ANXIETY 11/27/23   Paz, Jose E, MD  amLODipine  (NORVASC ) 5 MG tablet Take 1 tablet (5 mg total) by mouth daily. 03/23/24   Amon Aloysius BRAVO, MD  atorvastatin  (LIPITOR) 40 MG tablet Take 1 tablet (40 mg total) by mouth at bedtime. 05/17/24   Paz, Jose E, MD  Azelastine  HCl 137 MCG/SPRAY SOLN PLACE 2 SPRAYS INTO BOTH NOSTRILS IN THE MORNING AND AT BEDTIME. 01/18/24   Paz, Jose E, MD  Blood Glucose Monitoring Suppl (ONE TOUCH ULTRA 2) W/DEVICE KIT Use once daily to check blood sugar 09/21/13   Kassie Mallick, MD  Cetirizine HCl (ZYRTEC PO) Take 1 tablet by mouth daily as needed.    [provider]  DiphenhydrAMINE HCl (BENADRYL PO) Take by mouth as needed. Reported on 02/14/2016    [provider]  ibuprofen (ADVIL,MOTRIN) 200 MG tablet Take 200 mg by mouth every 6 (six) hours as needed.    [provider]  JARDIANCE 25 MG TABS tablet Take 25 mg by mouth in the morning. 07/11/21   [provider]  Lancets (ONETOUCH DELICA PLUS LANCET33G) MISC USE TO  CHECK BLOOD SUGARS ONCE DAILY 10/06/23   Paz, Jose E, MD  losartan  (COZAAR ) 25 MG tablet Take 1 tablet (25 mg total) by mouth daily. 05/17/24   Paz, Jose E, MD  Multiple Vitamin (MULTIVITAMIN WITH MINERALS) TABS tablet Take 1 tablet by mouth daily.    [provider]  ONE TOUCH ULTRA TEST test strip USE 1 STRIP DAILY AS DIRECTED. 11/13/14   Kassie Mallick, MD  SitaGLIPtin -MetFORMIN  HCl (JANUMET  XR) [phone] MG TB24 Take 1 tablet by mouth every evening. 02/09/16   [provider]  Triamcinolone Acetonide (NASACORT AQ NA) Place into the nose. Daily    [provider]  Vitamin D , Cholecalciferol, 1000 units TABS Take 400 mg by mouth daily.    [provider]    Allergies: Cefuroxime  axetil, Amoxicillin-pot clavulanate, Ezetimibe -simvastatin, Moxifloxacin, Nabumetone, Nitrofurantoin, Sulfonamide derivatives, and Prednisone    Review of Systems  All other systems reviewed and are negative.   Updated Vital Signs There were no vitals taken for this visit.  Physical Exam Vitals and nursing note reviewed.  Constitutional:      General: She is not in acute distress.    Appearance: She is well-developed.  HENT:     Head: Normocephalic and atraumatic.  Eyes:     Conjunctiva/sclera: Conjunctivae normal.  Cardiovascular:  Rate and Rhythm: Normal rate and regular rhythm.     Heart sounds: No murmur heard. Pulmonary:     Effort: Pulmonary effort is normal. No respiratory distress.     Breath sounds: Normal breath sounds.  Abdominal:     Palpations: Abdomen is soft.     Tenderness: There is no abdominal tenderness.  Musculoskeletal:        General: Tenderness present. No swelling.     Cervical back: Neck supple.     Comments: Tender with palpation to the left lateral hip.  Left leg is shortened and externally rotated consistent with likely left hip fracture.  Skin:    General: Skin is warm and dry.     Capillary Refill: Capillary refill takes less than 2  seconds.  Neurological:     Mental Status: She is alert.  Psychiatric:        Mood and Affect: Mood normal.     (all labs ordered are listed, but only abnormal results are displayed) Labs Reviewed - No data to display  EKG: None  Radiology: No results found.  {Document cardiac monitor, telemetry assessment procedure when appropriate:32947} Procedures   Medications Ordered in the ED  morphine (PF) 4 MG/ML injection 4 mg (has no administration in time range)  ondansetron (ZOFRAN) injection 4 mg (has no administration in time range)      {Click here for ABCD2, HEART and other calculators REFRESH Note before signing:1}                              Medical Decision Making Patient presents with left hip pain after fall.  She did not strike her head.  She has pain to the left hip and was unable to ambulate after the fall.  Imaging demonstrates left intertrochanteric hip fracture.  Additionally there is a lesion on the left iliac wing.  This is concerning for possible lytic lesion.  Case discussed with Dr. Barton, Ortho.  He recommends CT imaging for clarification of the lesion on the left iliac wing.  He is concerned that the patient has oncologic issue and the patient will need evaluation by Ortho Oncology.  He recommends transfer to [facility].  CT demonstrates: 1. Comminuted displaced intertrochanteric left femur fracture. No evidence of underlying lesion by CT. 2. Lytic lesions involving the left anterior iliac bone and left sacrum. Differential considerations include metastatic disease or myeloma. 3. Mixed density left adnexal mass measures 4.1 cm. Recommend nonemergent pelvic ultrasound for characterization. 4. Possible right bladder diverticulum versus adjacent ovarian cyst. This can be assessed on pelvic ultrasound. 5. Colonic diverticulosis without diverticulitis.  PALS line contacted regarding transfer.  Patient aware of and agrees with plan to  transfer.      Amount and/or Complexity of Data Reviewed Labs: ordered. Radiology: ordered.  Risk Prescription drug management.     {Document critical care time when appropriate  Document review of labs and clinical decision tools ie CHADS2VASC2, etc  Document your independent review of radiology images and any outside records  Document your discussion with family members, caretakers and with consultants  Document social determinants of health affecting pt's care  Document your decision making why or why not admission, treatments were needed:32947:::1}   Final diagnoses:  None    ED Discharge Orders     None

## 2024-06-29 NOTE — Progress Notes (Signed)
 Plan of Care Note   Patient: Nicole Duran MRN: [id number]   DOA: 06/29/2024  We received a consult call for this displaced intertrochanteric fracture of the left femur in the setting of a left iliac wing fracture or destructive mass. This will be further evaluated with CT imaging to further characterize. However, orthopedic surgery thinks the patient may need to go to a tertiary health care center and the admission to our service has been canceled.  We will be on standby to admit at one of our [facility] facilities if the treatment plan changes.  Author: Alm Dorn Castor, MD 06/29/2024  Check [email or web address] for on-call coverage.  Nursing staff, Please call [facility] Admits & Consults System-Wide number on Amion as soon as patient's arrival, so appropriate admitting provider can evaluate the pt.

## 2024-06-30 DIAGNOSIS — I499 Cardiac arrhythmia, unspecified: Secondary | ICD-10-CM | POA: Diagnosis not present

## 2024-06-30 DIAGNOSIS — R000 Tachycardia, unspecified: Secondary | ICD-10-CM | POA: Diagnosis not present

## 2024-06-30 LAB — HEMOGLOBIN A1C: Hemoglobin A1C: 7.1

## 2024-06-30 NOTE — Care Plan (Signed)
 Gerontology (ACE Unit) Progress Note  ASSESSMENT/PLAN: Nicole Duran is a 73 y.o. female with PMHx T2DM, HTN, HLD who presented from OSH for L hip fracture.  #Comminuted displaced intertrochanteric left femur fracture #GLF #Iliac/sacral lytic lesions - Patient initially presented to OSH ED after sustaining GLF trying to move garbage cans at home. Plain films showed L femur fracture and c/f fragmented appearance on L iliac wing. CT imaging of the femur and pelvis confirmed a communited displaced intertrochanteric L femur fx without evidence of underlying lesion. However, it also showed lytic lesions of left anterior iliac bone and sacrum, as well as a 4.1cm mixed density adnexal mass.   - Orthopedics was consulted and performed ORIF 10/30 without complication.  - There is concern given adnexal mass and lytic lesions that the patient has an underlying malignancy. It does not appear that the femur fracture was a result of a lesion per Ortho review of films. Patient was adamant that she would not want further workup besides blood work, though daughter (HPOA) wanted to - they are to discuss further amongst themselves. PLAN: - Orthopedic surgery consulted, appreciate recs  - WBAT LLE  - f/u scheduled - restart DVT ppx tomorrow - f/u serologic malignancy workup: SPEP/UPEP/FLC, peripheral smear, beta-2 microglobulin, CEA, CA125, CA 19-9, LDH - if pursuing full workup will plan to order CT CAP, MRI Brain, spine films   Chronic Medical Problems: #T2DM: SSI, glucose checks #HTN: Amlodipine  5 mg daily #HLD: Rosuvastatin  10 mg daily  Hospital Checklist: #DVT PPX: held 1d post-op #FEN: Adult Diet- Regular #Discharge Planning: pending PT/OT eval #Ethics: Full Code   Interval History: 06/30/24 Hospital Day: 0 After return from surgery, patient was somnolent though conversant. She denied any current pain and felt well. When attempting to discuss incidental findings of lytic lesions and adnexal  mass, patient physically plugged her ears and did not want to discuss, though was open to discuss with her daughter at bedside.  Relayed incidental findings to patient's daughters and concern for malignancy given mass and lytic lesions. She stated that she would want to pursue further workup though deferred ultimately to the patient. She planned to discuss with her further today.    OBJECTIVE: Vital Signs: Temp:  [97.7 F (36.5 C)-100.2 F (37.9 C)] 97.7 F (36.5 C) Heart Rate:  [83-113] 86 Resp:  [10-22] 18 BP: (130-181)/(40-83) 133/83  Physical Exam: 06/30/24 CONSTITUTIONAL: well developed, nourished, no distress and alert and oriented x3 CARDIOVASCULAR: heart sounds normal, intact distal pulses, normal rate, and regular rhythm PULMONARY/CHEST WALL: breath sounds normal and effort normal ABDOMINAL: bowel sounds normal and soft NEUROLOGICAL: awake, alert and oriented x3 and moving extremities spontaneously HENT: atraumatic, nose normal, and normocephalic MUSCULOSKELETAL: deferred  SKIN: warm, dry, well-perfused PSYCH: affect normal and mood normal  CBC:  Results from last 7 days  Lab Units 06/30/24 0450 06/30/24 0056  WHITE BLOOD CELL COUNT 10*3/uL 7.60 7.50  HEMOGLOBIN g/dL 86.1 86.1  HEMATOCRIT % 41.5 41.7  PLATELET COUNT 10*3/uL 268 273   CBD:  Results from last 7 days  Lab Units 06/30/24 0450 06/30/24 0056  WHITE BLOOD CELL COUNT 10*3/uL 7.60 7.50  RED BLOOD CELL COUNT 10*6/uL 4.50 4.53  HEMOGLOBIN g/dL 86.1 86.1  HEMATOCRIT % 41.5 41.7  MEAN CORPUSCULAR VOLUME fL 92.2 92.1  MEAN CORPUSCULAR HEMOGLOBIN pg 30.7 30.5  MEAN CORPUSCULAR HEMOGLOBIN CONC g/dL 66.5 66.7  RED CELL DISTRIBUTION WIDTH % 15.4 15.6  MEAN PLATELET VOLUME fL 8.1 8.4  PLATELET COUNT 10*3/uL 268 273  LYMPHOCYTES RELATIVE PERCENT %  --  23  NEUTROPHILS RELATIVE PERCENT %  --  68  MONOCYTES RELATIVE PERCENT %  --  9  EOSINOPHILS RELATIVE PERCENT %  --  0  BASOPHILS RELATIVE PERCENT %  --  0   NRBC ABS 10*3/uL  --  0.00  NEUTROPHILS ABSOLUTE COUNT 10*3/uL  --  5.10  LYMPHOCYTES ABSOLUTE COUNT 10*3/uL  --  1.70  MONOCYTES ABSOLUTE COUNT 10*3/uL  --  0.60  EOSINOPHILS ABSOLUTE COUNT 10*3/uL  --  0.00  BASOPHILS ABSOLUTE COUNT 10*3/uL  --  0.00   CMP:  Results from last 7 days  Lab Units 06/30/24 0151 06/30/24 0056  SODIUM mmol/L 140 139  POTASSIUM mmol/L 3.9 4.1  CHLORIDE mmol/L 107 106  CO2 mmol/L 22 24  BUN mg/dL 13 13  CREATININE mg/dL 9.32 9.31  CALCIUM  mg/dL 89.7 89.6  MAGNESIUM mg/dL 1.9  --   PHOSPHORUS mg/dL 3.6  --   BILIRUBIN TOTAL mg/dL  --  0.7  AST U/L  --  21  ALT U/L  --  20  TOTAL PROTEIN g/dL  --  6.6  ALBUMIN g/dL  --  4.2  ANION GAP mmol/L 11 9   Coags:   Results from last 7 days  Lab Units 06/30/24 0056  INR  1.0   BNP:    Troponin:      Electronically signed by: Prentice Roselind Holmes, MD 06/30/2024 3:22 PM

## 2024-07-14 ENCOUNTER — Encounter: Payer: Self-pay | Admitting: Internal Medicine

## 2024-08-02 NOTE — Progress Notes (Signed)
 Nicole Duran                                          MRN: [id number]   08/02/2024   The [facility] Quality Team Specialist reviewed this patient medical record for the purposes of chart review for care gap closure. The following were reviewed: abstraction for care gap closure-glycemic status assessment.    [facility] Quality Team

## 2024-08-12 ENCOUNTER — Telehealth: Payer: Self-pay

## 2024-08-12 NOTE — Telephone Encounter (Signed)
 Pt passed away on August 19, 2024.

## 2024-08-31 ENCOUNTER — Ambulatory Visit: Payer: Medicare Other

## 2024-09-01 DEATH — deceased

## 2024-09-05 ENCOUNTER — Ambulatory Visit: Admitting: Internal Medicine

## 2024-09-26 ENCOUNTER — Other Ambulatory Visit: Payer: Self-pay | Admitting: Internal Medicine

## 2024-09-26 DIAGNOSIS — I10 Essential (primary) hypertension: Secondary | ICD-10-CM
